# Patient Record
Sex: Male | Born: 1965 | Race: Black or African American | Hispanic: No | Smoking: Current every day smoker
Health system: Southern US, Community
[De-identification: ages and names within clinical notes are randomized; demographics above are authoritative.]

## PROBLEM LIST (undated history)

## (undated) DIAGNOSIS — M1990 Unspecified osteoarthritis, unspecified site: Secondary | ICD-10-CM

## (undated) DIAGNOSIS — I10 Essential (primary) hypertension: Secondary | ICD-10-CM

---

## 2017-07-26 ENCOUNTER — Encounter (HOSPITAL_COMMUNITY): Payer: Self-pay | Admitting: Emergency Medicine

## 2017-07-26 ENCOUNTER — Emergency Department (HOSPITAL_COMMUNITY)
Admission: EM | Admit: 2017-07-26 | Discharge: 2017-07-27 | Disposition: A | Discharge status: Home / Self Care | Payer: BLUE CROSS/BLUE SHIELD | Attending: Emergency Medicine | Admitting: Emergency Medicine

## 2017-07-26 ENCOUNTER — Emergency Department (HOSPITAL_COMMUNITY): Payer: BLUE CROSS/BLUE SHIELD

## 2017-07-26 DIAGNOSIS — R42 Dizziness and giddiness: Secondary | ICD-10-CM | POA: Diagnosis not present

## 2017-07-26 DIAGNOSIS — I10 Essential (primary) hypertension: Secondary | ICD-10-CM | POA: Insufficient documentation

## 2017-07-26 DIAGNOSIS — Z7982 Long term (current) use of aspirin: Secondary | ICD-10-CM | POA: Diagnosis not present

## 2017-07-26 DIAGNOSIS — R51 Headache: Secondary | ICD-10-CM | POA: Diagnosis not present

## 2017-07-26 HISTORY — DX: Essential (primary) hypertension: I10

## 2017-07-26 LAB — I-STAT CHEM 8, ED
BUN: 25 mg/dL — AB (ref 6–20)
CALCIUM ION: 1.21 mmol/L (ref 1.15–1.40)
CHLORIDE: 104 mmol/L (ref 101–111)
Creatinine, Ser: 1.4 mg/dL — ABNORMAL HIGH (ref 0.61–1.24)
GLUCOSE: 97 mg/dL (ref 65–99)
HCT: 44 % (ref 39.0–52.0)
Hemoglobin: 15 g/dL (ref 13.0–17.0)
Potassium: 3.6 mmol/L (ref 3.5–5.1)
Sodium: 139 mmol/L (ref 135–145)
TCO2: 24 mmol/L (ref 22–32)

## 2017-07-26 MED ORDER — HYDROXYZINE HCL 25 MG PO TABS: 25.0000 mg | ORAL_TABLET | Freq: Four times a day (QID) | ORAL | 0 refills | Status: DC | PRN

## 2017-07-26 MED ORDER — LISINOPRIL 20 MG PO TABS
20.0000 mg | ORAL_TABLET | Freq: Once | ORAL | Status: AC
  Administered 2017-07-26: 20 mg via ORAL
  Filled 2017-07-26: qty 1

## 2017-07-26 MED ORDER — OXYCODONE-ACETAMINOPHEN 5-325 MG PO TABS
1.0000 | ORAL_TABLET | Freq: Once | ORAL | Status: AC
  Administered 2017-07-26: 1 via ORAL
  Filled 2017-07-26: qty 1

## 2017-07-26 MED ORDER — LORAZEPAM 2 MG/ML IJ SOLN
1.0000 mg | Freq: Once | INTRAMUSCULAR | Status: AC
  Administered 2017-07-26: 1 mg via INTRAMUSCULAR
  Filled 2017-07-26: qty 1

## 2017-07-26 MED ORDER — LISINOPRIL 20 MG PO TABS: 20.0000 mg | ORAL_TABLET | Freq: Every day | ORAL | 1 refills | Status: DC

## 2017-07-26 MED ORDER — TRAMADOL HCL 50 MG PO TABS: 50.0000 mg | ORAL_TABLET | Freq: Four times a day (QID) | ORAL | 0 refills | Status: DC | PRN

## 2017-07-26 NOTE — Discharge Instructions (Signed)
Follow-up with cardiologist in 1-2 weeks.  Also follow-up with her primary care doctor

## 2017-07-26 NOTE — ED Triage Notes (Signed)
Patient c/o headache and dizziness x1 week, stating "I know my blood pressure is up, I haven't had my medicine in about six months." Denies chest pain.

## 2017-07-27 NOTE — ED Provider Notes (Signed)
[facility]   CSN: [id number] Arrival date & time: 07/26/17  1723     History   Chief Complaint Chief Complaint  Patient presents with  . Hypertension    HPI Justin Moore is a 52 y.o. male.  Patient states he has been off his blood pressure medicine for a while.  Patient complains of some dizziness.   The history is provided by the patient.  Hypertension  This is a recurrent problem. The current episode started more than 1 week ago. The problem occurs constantly. The problem has not changed since onset.Pertinent negatives include no chest pain, no abdominal pain and no headaches. Nothing aggravates the symptoms. Nothing relieves the symptoms.    Past Medical History:  Diagnosis Date  . Hypertension     There are no active problems to display for this patient.   History reviewed. No pertinent surgical history.     Home Medications    Prior to Admission medications   Medication Sig Start Date End Date Taking? Authorizing Provider  Aspirin-Salicylamide-Caffeine (BC HEADACHE POWDER PO) Take 3 packets by mouth daily as needed (pain).   Yes [provider]  hydrOXYzine (ATARAX/VISTARIL) 25 MG tablet Take 1 tablet (25 mg total) by mouth every 6 (six) hours as needed for anxiety (sleep). 07/26/17   Bethann BerkshireZammit, Gilbert Narain, MD  lisinopril (PRINIVIL,ZESTRIL) 20 MG tablet Take 1 tablet (20 mg total) by mouth daily. 07/26/17   Bethann BerkshireZammit, Collen Hostler, MD  traMADol (ULTRAM) 50 MG tablet Take 1 tablet (50 mg total) by mouth every 6 (six) hours as needed. 07/26/17   Bethann BerkshireZammit, Cyleigh Massaro, MD    Family History No family history on file.  Social History Social History   Tobacco Use  . Smoking status: Not on file  Substance Use Topics  . Alcohol use: Not on file  . Drug use: Not on file     Allergies   Patient has no known allergies.   Review of Systems Review of Systems  Constitutional: Negative for appetite change and fatigue.  HENT:  Negative for congestion, ear discharge and sinus pressure.   Eyes: Negative for discharge.  Respiratory: Negative for cough.   Cardiovascular: Negative for chest pain.  Gastrointestinal: Negative for abdominal pain and diarrhea.  Genitourinary: Negative for frequency and hematuria.  Musculoskeletal: Negative for back pain.  Skin: Negative for rash.  Neurological: Positive for dizziness. Negative for seizures and headaches.  Psychiatric/Behavioral: Negative for hallucinations.     Physical Exam Updated Vital Signs BP (!) 192/112 (BP Location: Left Arm)   Pulse 68   Temp 98.2 F (36.8 C) (Oral)   Resp 18   SpO2 94%   Physical Exam  Constitutional: He is oriented to person, place, and time. He appears well-developed.  HENT:  Head: Normocephalic.  Eyes: Conjunctivae and EOM are normal. No scleral icterus.  Neck: Neck supple. No thyromegaly present.  Cardiovascular: Normal rate and regular rhythm. Exam reveals no gallop and no friction rub.  No murmur heard. Pulmonary/Chest: No stridor. He has no wheezes. He has no rales. He exhibits no tenderness.  Abdominal: He exhibits no distension. There is no tenderness. There is no rebound.  Musculoskeletal: Normal range of motion. He exhibits no edema.  Lymphadenopathy:    He has no cervical adenopathy.  Neurological: He is oriented to person, place, and time. He exhibits normal muscle tone. Coordination normal.  Skin: No rash noted. No erythema.  Psychiatric: He has a normal mood and affect. His behavior is  normal.     ED Treatments / Results  Labs (all labs ordered are listed, but only abnormal results are displayed) Labs Reviewed  I-STAT CHEM 8, ED - Abnormal; Notable for the following components:      Result Value   BUN 25 (*)    Creatinine, Ser 1.40 (*)    All other components within normal limits    EKG  EKG Interpretation None       Radiology Dg Chest 2 View  Result Date: 07/26/2017 CLINICAL DATA:  Headache and  dizziness x1 week. EXAM: CHEST  2 VIEW COMPARISON:  None. FINDINGS: The heart size and mediastinal contours are within normal limits. Both lungs are clear. The visualized skeletal structures are unremarkable. IMPRESSION: No active cardiopulmonary disease. Electronically Signed   By: Tollie Eth M.D.   On: 07/26/2017 23:06    Procedures Procedures (including critical care time)  Medications Ordered in ED Medications  oxyCODONE-acetaminophen (PERCOCET/ROXICET) 5-325 MG per tablet 1 tablet (1 tablet Oral Given 07/26/17 2246)  lisinopril (PRINIVIL,ZESTRIL) tablet 20 mg (20 mg Oral Given 07/26/17 2246)  LORazepam (ATIVAN) injection 1 mg (1 mg Intramuscular Given 07/26/17 2327)     Initial Impression / Assessment and Plan / ED Course  I have reviewed the triage vital signs and the nursing notes.  Pertinent labs & imaging results that were available during my care of the patient were reviewed by me and considered in my medical decision making (see chart for details).   Patient blood pressure improved with treatment.  He will be sent home with lisinopril and Vistaril to help with sleep and anxiety  Final Clinical Impressions(s) / ED Diagnoses   Final diagnoses:  Essential hypertension    ED Discharge Orders        Ordered    lisinopril (PRINIVIL,ZESTRIL) 20 MG tablet  Daily     07/26/17 2349    hydrOXYzine (ATARAX/VISTARIL) 25 MG tablet  Every 6 hours PRN     07/26/17 2349    traMADol (ULTRAM) 50 MG tablet  Every 6 hours PRN     07/26/17 2349       Bethann Berkshire, MD 07/27/17 2336

## 2017-07-28 ENCOUNTER — Telehealth: Payer: Self-pay | Admitting: Emergency Medicine

## 2017-07-28 NOTE — Telephone Encounter (Signed)
[place] call [facility] [place] [facility] stating Dr. Estell HarpinZammit had not signed the prescription for tramadol.  Confirmed prescription information from chart review with Morrie Sheldonshley who confirmed.  Prescription will now be filled.  No further CM needs noted at this time.

## 2017-08-25 ENCOUNTER — Ambulatory Visit (INDEPENDENT_AMBULATORY_CARE_PROVIDER_SITE_OTHER): Payer: BLUE CROSS/BLUE SHIELD | Admitting: Family Medicine

## 2017-08-25 ENCOUNTER — Encounter: Payer: Self-pay | Admitting: Family Medicine

## 2017-08-25 VITALS — BP 154/92 | HR 80 | Temp 98.8°F | Resp 16 | Ht 70.0 in | Wt 225.0 lb

## 2017-08-25 DIAGNOSIS — Z136 Encounter for screening for cardiovascular disorders: Secondary | ICD-10-CM | POA: Diagnosis not present

## 2017-08-25 DIAGNOSIS — R51 Headache: Secondary | ICD-10-CM

## 2017-08-25 DIAGNOSIS — H547 Unspecified visual loss: Secondary | ICD-10-CM | POA: Diagnosis not present

## 2017-08-25 DIAGNOSIS — R809 Proteinuria, unspecified: Secondary | ICD-10-CM | POA: Diagnosis not present

## 2017-08-25 DIAGNOSIS — Z131 Encounter for screening for diabetes mellitus: Secondary | ICD-10-CM

## 2017-08-25 DIAGNOSIS — I10 Essential (primary) hypertension: Secondary | ICD-10-CM | POA: Diagnosis not present

## 2017-08-25 DIAGNOSIS — R9431 Abnormal electrocardiogram [ECG] [EKG]: Secondary | ICD-10-CM

## 2017-08-25 DIAGNOSIS — R519 Headache, unspecified: Secondary | ICD-10-CM

## 2017-08-25 LAB — POCT GLYCOSYLATED HEMOGLOBIN (HGB A1C): HEMOGLOBIN A1C: 5.6

## 2017-08-25 LAB — POCT URINALYSIS DIP (DEVICE)
Glucose, UA: NEGATIVE mg/dL
Hgb urine dipstick: NEGATIVE
Leukocytes, UA: NEGATIVE
Nitrite: NEGATIVE
PROTEIN: 100 mg/dL — AB
Specific Gravity, Urine: 1.025 (ref 1.005–1.030)
Urobilinogen, UA: 2 mg/dL — ABNORMAL HIGH (ref 0.0–1.0)
pH: 6 (ref 5.0–8.0)

## 2017-08-25 MED ORDER — AMLODIPINE BESYLATE 10 MG PO TABS: 10.0000 mg | ORAL_TABLET | Freq: Every day | ORAL | 3 refills | Status: DC

## 2017-08-25 MED ORDER — BUTALBITAL-APAP-CAFF-COD 50-300-40-30 MG PO CAPS: 1.0000 | ORAL_CAPSULE | Freq: Four times a day (QID) | ORAL | 0 refills | Status: DC | PRN

## 2017-08-25 MED ORDER — LABETALOL HCL 100 MG PO TABS: 100.0000 mg | ORAL_TABLET | Freq: Two times a day (BID) | ORAL | 1 refills | Status: DC

## 2017-08-25 NOTE — Patient Instructions (Signed)
I have placed your referral for cardiology and ophthalmology.  He will be contacted to schedule an appointment.  I am rechecking your renal function today as it was elevated during her recent ED visit.  I encourage you to reduce alcohol intake as this can adversely affect your cardiovascular health and liver function.  For your headache and start you on Fioricet you may take 1-2 tablets every 6 hours as needed for headache.  For now I would like you to stop the lisinopril.  I am starting you on amlodipine 10 mg once daily and labetalol 100 mg twice daily for blood pressure control.  Goal blood pressure is less than 140/90.  If your blood pressures persistently greater than 160/90 please follow-up with me here in the office sooner than your next scheduled appointment.   Hypertension Hypertension is another name for high blood pressure. High blood pressure forces your heart to work harder to pump blood. This can cause problems over time. There are two numbers in a blood pressure reading. There is a top number (systolic) over a bottom number (diastolic). It is best to have a blood pressure below 120/80. Healthy choices can help lower your blood pressure. You may need medicine to help lower your blood pressure if:  Your blood pressure cannot be lowered with healthy choices.  Your blood pressure is higher than 130/80.  Follow these instructions at home: Eating and drinking  If directed, follow the DASH eating plan. This diet includes: ? Filling half of your plate at each meal with fruits and vegetables. ? Filling one quarter of your plate at each meal with whole grains. Whole grains include whole wheat pasta, brown rice, and whole grain bread. ? Eating or drinking low-fat dairy products, such as skim milk or low-fat yogurt. ? Filling one quarter of your plate at each meal with low-fat (lean) proteins. Low-fat proteins include fish, skinless chicken, eggs, beans, and tofu. ? Avoiding fatty  meat, cured and processed meat, or chicken with skin. ? Avoiding premade or processed food.  Eat less than 1,500 mg of salt (sodium) a day.  Limit alcohol use to no more than 1 drink a day for nonpregnant women and 2 drinks a day for men. One drink equals 12 oz of beer, 5 oz of wine, or 1 oz of hard liquor. Lifestyle  Work with your doctor to stay at a healthy weight or to lose weight. Ask your doctor what the best weight is for you.  Get at least 30 minutes of exercise that causes your heart to beat faster (aerobic exercise) most days of the week. This may include walking, swimming, or biking.  Get at least 30 minutes of exercise that strengthens your muscles (resistance exercise) at least 3 days a week. This may include lifting weights or pilates.  Do not use any products that contain nicotine or tobacco. This includes cigarettes and e-cigarettes. If you need help quitting, ask your doctor.  Check your blood pressure at home as told by your doctor.  Keep all follow-up visits as told by your doctor. This is important. Medicines  Take over-the-counter and prescription medicines only as told by your doctor. Follow directions carefully.  Do not skip doses of blood pressure medicine. The medicine does not work as well if you skip doses. Skipping doses also puts you at risk for problems.  Ask your doctor about side effects or reactions to medicines that you should watch for. Contact a doctor if:  You think you are  having a reaction to the medicine you are taking.  You have headaches that keep coming back (recurring).  You feel dizzy.  You have swelling in your ankles.  You have trouble with your vision. Get help right away if:  You get a very bad headache.  You start to feel confused.  You feel weak or numb.  You feel faint.  You get very bad pain in your: ? Chest. ? Belly (abdomen).  You throw up (vomit) more than once.  You have trouble  breathing. Summary  Hypertension is another name for high blood pressure.  Making healthy choices can help lower blood pressure. If your blood pressure cannot be controlled with healthy choices, you may need to take medicine. This information is not intended to replace advice given to you by your health care provider. Make sure you discuss any questions you have with your health care provider. Document Released: 12/28/2007 Document Revised: 06/08/2016 Document Reviewed: 06/08/2016 Elsevier Interactive Patient Education  2018 [employer]     Alcohol Use Disorder Alcohol use disorder is when your drinking disrupts your daily life. When you have this condition, you drink too much alcohol and you cannot control your drinking. Alcohol use disorder can cause serious problems with your physical health. It can affect your brain, heart, liver, pancreas, immune system, stomach, and intestines. Alcohol use disorder can increase your risk for certain cancers and cause problems with your mental health, such as depression, anxiety, psychosis, delirium, and dementia. People with this disorder risk hurting themselves and others. What are the causes? This condition is caused by drinking too much alcohol over time. It is not caused by drinking too much alcohol only one or two times. Some people with this condition drink alcohol to cope with or escape from negative life events. Others drink to relieve pain or symptoms of mental illness. What increases the risk? You are more likely to develop this condition if:  You have a family history of alcohol use disorder.  Your culture encourages drinking to the point of intoxication, or makes alcohol easy to get.  You had a mood or conduct disorder in childhood.  You have been a victim of abuse.  You are an adolescent and: ? You have poor grades or difficulties in school. ? Your caregivers do not talk to you about saying no to alcohol, or supervise your  activities. ? You are impulsive or you have trouble with self-control.  What are the signs or symptoms? Symptoms of this condition include:  Drinkingmore than you want to.  Drinking for longer than you want to.  Trying several times to drink less or to control your drinking.  Spending a lot of time getting alcohol, drinking, or recovering from drinking.  Craving alcohol.  Having problems at work, at school, or at home due to drinking.  Having problems in relationships due to drinking.  Drinking when it is dangerous to drink, such as before driving a car.  Continuing to drink even though you know you might have a physical or mental problem related to drinking.  Needing more and more alcohol to get the same effect you want from the alcohol (building up tolerance).  Having symptoms of withdrawal when you stop drinking. Symptoms of withdrawal include: ? Fatigue. ? Nightmares. ? Trouble sleeping. ? Depression. ? Anxiety. ? Fever. ? Seizures. ? Severe confusion. ? Feeling or seeing things that are not there (hallucinations). ? Tremors. ? Rapid heart rate. ? Rapid breathing. ? High blood pressure.  Drinking to avoid symptoms of withdrawal.  How is this diagnosed? This condition is diagnosed with an assessment. Your health care provider may start the assessment by asking three or four questions about your drinking. Your health care provider may perform a physical exam or do lab tests to see if you have physical problems resulting from alcohol use. She or he may refer you to a mental health professional for evaluation. How is this treated? Some people with alcohol use disorder are able to reduce their alcohol use to low-risk levels. Others need to completely quit drinking alcohol. When necessary, mental health professionals with specialized training in substance use treatment can help. Your health care provider can help you decide how severe your alcohol use disorder is and  what type of treatment you need. The following forms of treatment are available:  Detoxification. Detoxification involves quitting drinking and using prescription medicines within the first week to help lessen withdrawal symptoms. This treatment is important for people who have had withdrawal symptoms before and for heavy drinkers who are likely to have withdrawal symptoms. Alcohol withdrawal can be dangerous, and in severe cases, it can cause death. Detoxification may be provided in a home, community, or primary care setting, or in a hospital or substance use treatment facility.  Counseling. This treatment is also called talk therapy. It is provided by substance use treatment counselors. A counselor can address the reasons you use alcohol and suggest ways to keep you from drinking again or to prevent problem drinking. The goals of talk therapy are to: ? Find healthy activities and ways for you to cope with stress. ? Identify and avoid the things that trigger your alcohol use. ? Help you learn how to handle cravings.  Medicines.Medicines can help treat alcohol use disorder by: ? Decreasing alcohol cravings. ? Decreasing the positive feeling you have when you drink alcohol. ? Causing an uncomfortable physical reaction when you drink alcohol (aversion therapy).  Support groups. Support groups are led by people who have quit drinking. They provide emotional support, advice, and guidance.  These forms of treatment are often combined. Some people with this condition benefit from a combination of treatments provided by specialized substance use treatment centers. Follow these instructions at home:  Take over-the-counter and prescription medicines only as told by your health care provider.  Check with your health care provider before starting any new medicines.  Ask friends and family members not to offer you alcohol.  Avoid situations where alcohol is served, including gatherings where others are  drinking alcohol.  Create a plan for what to do when you are tempted to use alcohol.  Find hobbies or activities that you enjoy that do not include alcohol.  Keep all follow-up visits as told by your health care provider. This is important. How is this prevented?  If you drink, limit alcohol intake to no more than 1 drink a day for nonpregnant women and 2 drinks a day for men. One drink equals 12 oz of beer, 5 oz of wine, or 1 oz of hard liquor.  If you have a mental health condition, get treatment and support.  Do not give alcohol to adolescents.  If you are an adolescent: ? Do not drink alcohol. ? Do not be afraid to say no if someone offers you alcohol. Speak up about why you do not want to drink. You can be a positive role model for your friends and set a good example for those around you by not drinking  alcohol. ? If your friends drink, spend time with others who do not drink alcohol. Make new friends who do not use alcohol. ? Find healthy ways to manage stress and emotions, such as meditation or deep breathing, exercise, spending time in nature, listening to music, or talking with a trusted friend or family member. Contact a health care provider if:  You are not able to take your medicines as told.  Your symptoms get worse.  You return to drinking alcohol (relapse) and your symptoms get worse. Get help right away if:  You have thoughts about hurting yourself or others. If you ever feel like you may hurt yourself or others, or have thoughts about taking your own life, get help right away. You can go to your nearest emergency department or call:  Your local emergency services (911 in the [place]  A suicide crisis helpline, such as the National Suicide Prevention Lifeline at 206-626-9355. This is open 24 hours a day.  Summary  Alcohol use disorder is when your drinking disrupts your daily life. When you have this condition, you drink too much alcohol and you cannot control  your drinking.  Treatment may include detoxification, counseling, medicine, and support groups.  Ask friends and family members not to offer you alcohol. Avoid situations where alcohol is served.  Get help right away if you have thoughts about hurting yourself or others. This information is not intended to replace advice given to you by your health care provider. Make sure you discuss any questions you have with your health care provider. Document Released: 08/18/2004 Document Revised: 04/07/2016 Document Reviewed: 04/07/2016 Elsevier Interactive Patient Education  [employer].

## 2017-08-25 NOTE — Progress Notes (Signed)
Patient ID: Justin Moore, male    DOB: 19-Mar-1966, 52 y.o.   MRN: [id number]  PCP: Bing Neighbors, FNP  Chief Complaint  Patient presents with  . Establish Care  . Hospitalization Follow-up    Subjective:  HPI Justin Moore is a 52 y.o. male with hypertension, chronic alcohol use, everyday smoker, presents for hospital follow-up and to establish care. Diagnosed with hypertension 2007 and reports that he has been off blood pressure medication for 2 years. He presented to the ED 07/26/2017 with a complaint of dizziness, headache, and feeling poorly which he attributed to his blood pressure. While at ED he was started back on BP medications.  Occasionally monitors blood pressure at home with most readings greater than 200/100. Reports that his vision has worsened over the course of year.  Current daily smoker and reports drinking on average 1 pint of liquor per day, he does not see this as a problem.  He reports chronic shortness of breath which he attributes to smoking.  Family history is significant for heart disease his father died at age 54 of an MI and hypertension.  His mother is living and has hypertension. Denies chest pain, cough, leg swelling, headache, or new weakness. Social History   Socioeconomic History  . Marital status: Unknown    Spouse name: Not on file  . Number of children: Not on file  . Years of education: Not on file  . Highest education level: Not on file  Social Needs  . Financial resource strain: Not on file  . Food insecurity - worry: Not on file  . Food insecurity - inability: Not on file  . Transportation needs - medical: Not on file  . Transportation needs - non-medical: Not on file  Occupational History  . Not on file  Tobacco Use  . Smoking status: Current Every Day Smoker  . Smokeless tobacco: Never Used  Substance and Sexual Activity  . Alcohol use: Yes  . Drug use: No  . Sexual activity: Not on file  Other Topics Concern  . Not on file   Social History Narrative  . Not on file   Family History  Problem Relation Age of Onset  . Hypertension Mother   . Heart attack Father   . Hypertension Father     Review of Systems  Constitutional: Negative.   Eyes: Positive for visual disturbance.  Respiratory: Positive for shortness of breath. Negative for cough.   Cardiovascular: Negative.   Gastrointestinal: Negative.   Genitourinary: Negative.   Musculoskeletal: Negative.   Neurological: Positive for headaches.  Hematological: Negative.   Psychiatric/Behavioral: Negative.    No Known Allergies  Prior to Admission medications   Medication Sig Start Date End Date Taking? Authorizing Provider  Aspirin-Salicylamide-Caffeine (BC HEADACHE POWDER PO) Take 3 packets by mouth daily as needed (pain).   Yes [provider]  lisinopril (PRINIVIL,ZESTRIL) 20 MG tablet Take 1 tablet (20 mg total) by mouth daily. 07/26/17  Yes Bethann Berkshire, MD  hydrOXYzine (ATARAX/VISTARIL) 25 MG tablet Take 1 tablet (25 mg total) by mouth every 6 (six) hours as needed for anxiety (sleep). Patient not taking: Reported on 08/25/2017 07/26/17   Bethann Berkshire, MD  traMADol (ULTRAM) 50 MG tablet Take 1 tablet (50 mg total) by mouth every 6 (six) hours as needed. Patient not taking: Reported on 08/25/2017 07/26/17   Bethann Berkshire, MD    Past Medical, Surgical Family and Social History reviewed and updated.    Objective:  Today's Vitals   08/25/17 1001 08/25/17 1250  BP: (!) 160/94 (!) 154/92  Pulse: 80   Resp: 16   Temp: 98.8 F (37.1 C)   TempSrc: Oral   SpO2: 100%   Weight: 225 lb (102.1 kg)   Height: 5\' 10"  (1.778 m)     Wt Readings from Last 3 Encounters:  08/25/17 225 lb (102.1 kg)   Physical Exam  Constitutional: He is oriented to person, place, and time. He appears well-developed.  HENT:  Head: Normocephalic and atraumatic.  Eyes: Conjunctivae and EOM are normal. Pupils are equal, round, and reactive to light.  Neck: Normal  range of motion. Neck supple.  Cardiovascular: Normal rate, regular rhythm, normal heart sounds and intact distal pulses.  Pulmonary/Chest: Effort normal and breath sounds normal.  Abdominal: Soft. Bowel sounds are normal.  Musculoskeletal: Normal range of motion.  Neurological: He is alert and oriented to person, place, and time.  Skin: Skin is warm and dry.  Psychiatric: He has a normal mood and affect. His behavior is normal. Judgment and thought content normal.   Assessment & Plan:  1. Essential hypertension, Elevated, not at goal. Continue  lisinopril.  Adding labetalol 100 mg twice daily. We have discussed target BP range and blood pressure goal. I have advised patient to check BP regularly and to call us back or report to clinic if the numbers are consistently higher than 140/90. We discussed the importance of compliance with medical therapy and DASH diet recommended, consequences of uncontrolled hypertension discussed. Checking TSH, level today.   2. Abnormal EKG, EKG taken in the ED was significant for ST elevation and T wave abnormality.  Due to prolonged uncontrolled hypertension and shortness of breath symptoms I will refer to cardiology for further evaluation. Ambulatory referral to cardiology.  3. Screening for diabetes mellitus, A1C 5.6. Normal, will repeat in 12 months.  4. Proteinuria, unspecified type, secondary to uncontrolled accelerated hypertension. Will obtain microalbumin/creatinine ratio, urine, comprehensive metabolic panel  5. Screening for cardiovascular condition- Lipid panel  6. Nonintractable headache, unspecified chronicity pattern, unspecified headache type, will trial Fioricet 1-2 tablets every 6 hours as needed for headache pain.  7. Visual acuity reduced, referring ambulatory referral to Ophthalmology.    Meds ordered this encounter  Medications  . labetalol (NORMODYNE) 100 MG tablet    Sig: Take 1 tablet (100 mg total) by mouth 2 (two) times daily.     Dispense:  180 tablet    Refill:  1    Order Specific Question:   Supervising Provider    Answer:   Quentin AngstJEGEDE, OLUGBEMIGA E [id number]]  . amLODipine (NORVASC) 10 MG tablet    Sig: Take 1 tablet (10 mg total) by mouth daily.    Dispense:  90 tablet    Refill:  3    Order Specific Question:   Supervising Provider    Answer:   Quentin AngstJEGEDE, OLUGBEMIGA E [id number]]  . Butalbital-APAP-Caff-Cod 50-300-40-30 MG CAPS    Sig: Take 1-2 tablets by mouth every 6 (six) hours as needed.    Dispense:  60 capsule    Refill:  0    Order Specific Question:   Supervising Provider    Answer:   Quentin AngstJEGEDE, OLUGBEMIGA E [[id number]]    Orders Placed This Encounter  Procedures  . Microalbumin/Creatinine Ratio, Urine  . Comprehensive metabolic panel  . CBC with Differential  . Thyroid Panel With TSH  . Lipid panel  . Ambulatory referral to Cardiology  . Ambulatory referral to Ophthalmology  .  POCT glycosylated hemoglobin (Hb A1C)  . POCT urinalysis dip (device)    RTC: 3 months for chronic condition follow-up and 2 weeks for hypertension follow-up.  Godfrey Pick. Tiburcio Pea, MSN, FNP-C [facility]  [address] [place], [place] [address] [phone]

## 2017-08-26 LAB — COMPREHENSIVE METABOLIC PANEL
ALT: 24 IU/L (ref 0–44)
AST: 26 IU/L (ref 0–40)
Albumin/Globulin Ratio: 1.7 (ref 1.2–2.2)
Albumin: 4.6 g/dL (ref 3.5–5.5)
Alkaline Phosphatase: 65 IU/L (ref 39–117)
BUN/Creatinine Ratio: 10 (ref 9–20)
BUN: 13 mg/dL (ref 6–24)
Bilirubin Total: 0.5 mg/dL (ref 0.0–1.2)
CALCIUM: 9.3 mg/dL (ref 8.7–10.2)
CO2: 19 mmol/L — AB (ref 20–29)
CREATININE: 1.29 mg/dL — AB (ref 0.76–1.27)
Chloride: 104 mmol/L (ref 96–106)
GFR calc Af Amer: 75 mL/min/{1.73_m2} (ref >59)
GFR, EST NON AFRICAN AMERICAN: 65 mL/min/{1.73_m2} (ref >59)
GLOBULIN, TOTAL: 2.7 g/dL (ref 1.5–4.5)
Glucose: 98 mg/dL (ref 65–99)
Potassium: 3.8 mmol/L (ref 3.5–5.2)
Sodium: 145 mmol/L — ABNORMAL HIGH (ref 134–144)
Total Protein: 7.3 g/dL (ref 6.0–8.5)

## 2017-08-26 LAB — CBC WITH DIFFERENTIAL/PLATELET
BASOS: 0 %
Basophils Absolute: 0 10*3/uL (ref 0.0–0.2)
EOS (ABSOLUTE): 0.1 10*3/uL (ref 0.0–0.4)
EOS: 1 %
HEMATOCRIT: 42.7 % (ref 37.5–51.0)
Hemoglobin: 15.5 g/dL (ref 13.0–17.7)
IMMATURE GRANS (ABS): 0 10*3/uL (ref 0.0–0.1)
IMMATURE GRANULOCYTES: 0 %
LYMPHS: 29 %
Lymphocytes Absolute: 2.5 10*3/uL (ref 0.7–3.1)
MCH: 32.8 pg (ref 26.6–33.0)
MCHC: 36.3 g/dL — ABNORMAL HIGH (ref 31.5–35.7)
MCV: 91 fL (ref 79–97)
Monocytes Absolute: 0.6 10*3/uL (ref 0.1–0.9)
Monocytes: 7 %
NEUTROS PCT: 63 %
Neutrophils Absolute: 5.3 10*3/uL (ref 1.4–7.0)
Platelets: 229 10*3/uL (ref 150–379)
RBC: 4.72 x10E6/uL (ref 4.14–5.80)
RDW: 13.9 % (ref 12.3–15.4)
WBC: 8.6 10*3/uL (ref 3.4–10.8)

## 2017-08-26 LAB — MICROALBUMIN / CREATININE URINE RATIO
Creatinine, Urine: 363 mg/dL
MICROALB/CREAT RATIO: 100.1 mg/g{creat} — AB (ref 0.0–30.0)
MICROALBUM., U, RANDOM: 363.2 ug/mL

## 2017-08-26 LAB — THYROID PANEL WITH TSH
FREE THYROXINE INDEX: 1.5 (ref 1.2–4.9)
T3 UPTAKE RATIO: 29 % (ref 24–39)
T4, Total: 5.2 ug/dL (ref 4.5–12.0)
TSH: 1.47 u[IU]/mL (ref 0.450–4.500)

## 2017-08-26 LAB — LIPID PANEL
Chol/HDL Ratio: 4.9 ratio (ref 0.0–5.0)
Cholesterol, Total: 215 mg/dL — ABNORMAL HIGH (ref 100–199)
HDL: 44 mg/dL (ref >39)
LDL Calculated: 139 mg/dL — ABNORMAL HIGH (ref 0–99)
TRIGLYCERIDES: 159 mg/dL — AB (ref 0–149)
VLDL Cholesterol Cal: 32 mg/dL (ref 5–40)

## 2017-08-31 ENCOUNTER — Encounter: Payer: Self-pay | Admitting: Family Medicine

## 2017-08-31 ENCOUNTER — Telehealth: Payer: Self-pay | Admitting: Family Medicine

## 2017-08-31 MED ORDER — ATORVASTATIN CALCIUM 40 MG PO TABS: 40.0000 mg | ORAL_TABLET | Freq: Every day | ORAL | 3 refills | Status: DC

## 2017-08-31 NOTE — Telephone Encounter (Signed)
Office visit note complete please complete referral to ophthalmology.   Patient to advise his renal function is improving therefore he should continue medications to improve overall blood pressure as this will improve protein spilling in urine. His cholesterol panel is abnormal. I am starting him on atorvastatin 40 mg once daily.   Godfrey PickKimberly S. Tiburcio PeaHarris, MSN, FNP-C [facility]  [address] [place], [place] [address] [phone]

## 2017-09-01 NOTE — Telephone Encounter (Signed)
Left a vm for patient to callback 

## 2017-09-04 NOTE — Telephone Encounter (Signed)
Left a vm for patient to callback 

## 2017-09-05 ENCOUNTER — Ambulatory Visit: Payer: BLUE CROSS/BLUE SHIELD | Admitting: Physician Assistant

## 2017-09-05 NOTE — Telephone Encounter (Signed)
Left a vm for patient to callback 

## 2017-09-11 NOTE — Telephone Encounter (Signed)
Letter mailed for patient to contact office.

## 2017-09-13 ENCOUNTER — Encounter: Payer: Self-pay | Admitting: Physician Assistant

## 2017-11-22 ENCOUNTER — Ambulatory Visit: Payer: BLUE CROSS/BLUE SHIELD | Admitting: Family Medicine

## 2018-07-08 ENCOUNTER — Encounter: Payer: Self-pay | Admitting: Emergency Medicine

## 2018-07-08 ENCOUNTER — Emergency Department
Admission: EM | Admit: 2018-07-08 | Discharge: 2018-07-08 | Disposition: A | Discharge status: Home / Self Care | Payer: Self-pay | Attending: Emergency Medicine | Admitting: Emergency Medicine

## 2018-07-08 DIAGNOSIS — F17200 Nicotine dependence, unspecified, uncomplicated: Secondary | ICD-10-CM | POA: Insufficient documentation

## 2018-07-08 DIAGNOSIS — I10 Essential (primary) hypertension: Secondary | ICD-10-CM | POA: Insufficient documentation

## 2018-07-08 LAB — BASIC METABOLIC PANEL
ANION GAP: 10 (ref 5–15)
BUN: 22 mg/dL — ABNORMAL HIGH (ref 6–20)
CO2: 22 mmol/L (ref 22–32)
Calcium: 8.6 mg/dL — ABNORMAL LOW (ref 8.9–10.3)
Chloride: 110 mmol/L (ref 98–111)
Creatinine, Ser: 1.23 mg/dL (ref 0.61–1.24)
GFR calc Af Amer: >60 mL/min (ref >60)
Glucose, Bld: 101 mg/dL — ABNORMAL HIGH (ref 70–99)
Potassium: 3.5 mmol/L (ref 3.5–5.1)
SODIUM: 142 mmol/L (ref 135–145)

## 2018-07-08 LAB — CBC
HCT: 42.8 % (ref 39.0–52.0)
Hemoglobin: 15.7 g/dL (ref 13.0–17.0)
MCH: 33.2 pg (ref 26.0–34.0)
MCHC: 36.7 g/dL — ABNORMAL HIGH (ref 30.0–36.0)
MCV: 90.5 fL (ref 80.0–100.0)
Platelets: 248 10*3/uL (ref 150–400)
RBC: 4.73 MIL/uL (ref 4.22–5.81)
RDW: 12.4 % (ref 11.5–15.5)
WBC: 9.2 10*3/uL (ref 4.0–10.5)
nRBC: 0 % (ref 0.0–0.2)

## 2018-07-08 MED ORDER — AMLODIPINE BESYLATE 10 MG PO TABS: 10.0000 mg | ORAL_TABLET | Freq: Every day | ORAL | 11 refills | Status: DC

## 2018-07-08 NOTE — ED Notes (Signed)
Pt asking for work note stating that he was out of work, Wednesday, Thursday, and Friday due to his complaint today. RN explained to pt that we cannot give him a note stating this as we did not see him for the complaint until today.

## 2018-07-08 NOTE — ED Triage Notes (Signed)
Pt to ED with c/o of "not feeling well". Pt states has HTN and has not been taking medication. Pt's BP in triage 172/104. Pt also has c/o of bilateral wrist pain.

## 2018-07-08 NOTE — ED Provider Notes (Signed)
[facility] Emergency Department Provider Note   ____________________________________________    I have reviewed the triage vital signs and the nursing notes.   HISTORY  Chief Complaint Hypertension     HPI Justin Moore is a 52 y.o. male with a history of high blood pressure presents today with complaints of high blood pressure.  Patient reports that he has not taken his blood pressure medication in 5 months, he does not remember what he was supposed to be taking.  He denies chest pain, he denies shortness of breath to me, despite triage notes.  No fevers or chills.  No leg swelling.  He states that he feels "low energy "but otherwise well.  No nausea or vomiting   Past Medical History:  Diagnosis Date  . Hypertension     There are no active problems to display for this patient.   History reviewed. No pertinent surgical history.  Prior to Admission medications   Medication Sig Start Date End Date Taking? Authorizing Provider  amLODipine (NORVASC) 10 MG tablet Take 1 tablet (10 mg total) by mouth daily. 07/08/18 07/08/19  Jene Every, MD     Allergies Patient has no known allergies.  History reviewed. No pertinent family history.  Social History Social History   Tobacco Use  . Smoking status: Current Every Day Smoker    Packs/day: 0.50  . Smokeless tobacco: Never Used  Substance Use Topics  . Alcohol use: Yes  . Drug use: Never    Review of Systems  Constitutional: No fever/chills low energy Eyes: No visual changes.  ENT: No sore throat. Cardiovascular: Denies chest pain. Respiratory: Denies shortness of breath. Gastrointestinal: No abdominal pain.   Genitourinary: Negative for dysuria. Musculoskeletal: Negative for back pain. Skin: Negative for rash. Neurological: Negative for headaches or weakness   ____________________________________________   PHYSICAL EXAM:  VITAL SIGNS: ED Triage Vitals  Enc Vitals Group      BP 07/08/18 1458 (!) 172/104     Pulse Rate 07/08/18 1458 77     Resp 07/08/18 1458 18     Temp 07/08/18 1458 98 F (36.7 C)     Temp Source 07/08/18 1458 Oral     SpO2 07/08/18 1458 98 %     Weight 07/08/18 1502 95.3 kg (210 lb)     Height 07/08/18 1502 1.753 m (5\' 9" )     Head Circumference --      Peak Flow --      Pain Score 07/08/18 1502 7     Pain Loc --      Pain Edu? --      Excl. in [place]? --   Constitutional: Alert and oriented. No acute distress. Pleasant and interactive Eyes: Conjunctivae are normal.    Mouth/Throat: Mucous membranes are moist.    Cardiovascular: Normal rate, regular rhythm. Grossly normal heart sounds.  Good peripheral circulation. Respiratory: Normal respiratory effort.  No retractions. Lungs CTAB. Gastrointestinal: Soft and nontender. No distention.  No CVA tenderness.  Musculoskeletal: No lower extremity tenderness nor edema.  Warm and well perfused Neurologic:  Normal speech and language. No gross focal neurologic deficits are appreciated.  Skin:  Skin is warm, dry and intact. No rash noted. Psychiatric: Mood and affect are normal. Speech and behavior are normal.  ____________________________________________   LABS (all labs ordered are listed, but only abnormal results are displayed)  Labs Reviewed  BASIC METABOLIC PANEL - Abnormal; Notable for the following components:      Result  Value   Glucose, Bld 101 (*)    BUN 22 (*)    Calcium 8.6 (*)    All other components within normal limits  CBC - Abnormal; Notable for the following components:   MCHC 36.7 (*)    All other components within normal limits  URINALYSIS, COMPLETE (UACMP) WITH MICROSCOPIC  CBG MONITORING, ED   ____________________________________________  EKG  ED ECG REPORT I, Jene Everyobert Josip Merolla, the attending physician, personally viewed and interpreted this ECG.  Date: 07/08/2018  Rhythm: normal sinus rhythm QRS Axis: normal Intervals: normal ST/T Wave  abnormalities: Nonspecific changes Narrative Interpretation: Patient has no chest pain  ____________________________________________  RADIOLOGY  None ____________________________________________   PROCEDURES  Procedure(s) performed: No  Procedures   Critical Care performed: No ____________________________________________   INITIAL IMPRESSION / ASSESSMENT AND PLAN / ED COURSE  Pertinent labs & imaging results that were available during my care of the patient were reviewed by me and considered in my medical decision making (see chart for details).  Patient well-appearing and in no acute distress.  He is here primarily for blood pressure medication.  We will start him on amlodipine and have him [facility].  Return precautions discussed as needed    ____________________________________________   FINAL CLINICAL IMPRESSION(S) / ED DIAGNOSES  Final diagnoses:  Essential hypertension        Note:  This document was prepared using Dragon voice recognition software and may include unintentional dictation errors.    Jene EveryKinner, Alaina Donati, MD 07/08/18 [phone]

## 2018-07-08 NOTE — ED Notes (Signed)
Pt being evaluated for high blood pressure. Pt states that he has been out of his blood pressure medication for about 5 months. Pt states that he just moved to the area and doesn't have a PCP.

## 2018-07-12 ENCOUNTER — Encounter: Payer: Self-pay | Admitting: Family Medicine

## 2019-01-26 ENCOUNTER — Emergency Department
Admission: EM | Admit: 2019-01-26 | Discharge: 2019-01-26 | Disposition: A | Discharge status: Home / Self Care | Payer: Self-pay | Attending: Emergency Medicine | Admitting: Emergency Medicine

## 2019-01-26 ENCOUNTER — Other Ambulatory Visit: Payer: Self-pay

## 2019-01-26 DIAGNOSIS — K047 Periapical abscess without sinus: Secondary | ICD-10-CM | POA: Insufficient documentation

## 2019-01-26 DIAGNOSIS — Z79899 Other long term (current) drug therapy: Secondary | ICD-10-CM | POA: Insufficient documentation

## 2019-01-26 DIAGNOSIS — Z7982 Long term (current) use of aspirin: Secondary | ICD-10-CM | POA: Insufficient documentation

## 2019-01-26 DIAGNOSIS — F17200 Nicotine dependence, unspecified, uncomplicated: Secondary | ICD-10-CM | POA: Insufficient documentation

## 2019-01-26 DIAGNOSIS — I10 Essential (primary) hypertension: Secondary | ICD-10-CM | POA: Insufficient documentation

## 2019-01-26 MED ORDER — CLINDAMYCIN HCL 300 MG PO CAPS: 300.0000 mg | ORAL_CAPSULE | Freq: Three times a day (TID) | ORAL | 0 refills | Status: AC

## 2019-01-26 MED ORDER — ONDANSETRON 4 MG PO TBDP
4.0000 mg | ORAL_TABLET | Freq: Once | ORAL | Status: AC
  Administered 2019-01-26: 4 mg via ORAL
  Filled 2019-01-26: qty 1

## 2019-01-26 MED ORDER — AMLODIPINE BESYLATE 10 MG PO TABS: 10.0000 mg | ORAL_TABLET | Freq: Every day | ORAL | 11 refills | Status: DC

## 2019-01-26 MED ORDER — AMLODIPINE BESYLATE 5 MG PO TABS
10.0000 mg | ORAL_TABLET | Freq: Once | ORAL | Status: AC
  Administered 2019-01-26: 10 mg via ORAL
  Filled 2019-01-26: qty 2

## 2019-01-26 MED ORDER — OXYCODONE-ACETAMINOPHEN 5-325 MG PO TABS
1.0000 | ORAL_TABLET | Freq: Once | ORAL | Status: AC
  Administered 2019-01-26: 1 via ORAL
  Filled 2019-01-26: qty 1

## 2019-01-26 MED ORDER — HYDROCODONE-ACETAMINOPHEN 5-325 MG PO TABS: 1.0000 | ORAL_TABLET | Freq: Four times a day (QID) | ORAL | 0 refills | Status: DC | PRN

## 2019-01-26 MED ORDER — KETOROLAC TROMETHAMINE 30 MG/ML IJ SOLN
30.0000 mg | Freq: Once | INTRAMUSCULAR | Status: AC
  Administered 2019-01-26: 30 mg via INTRAMUSCULAR
  Filled 2019-01-26: qty 1

## 2019-01-26 MED ORDER — HYDROCODONE-ACETAMINOPHEN 5-325 MG PO TABS: 1.0000 | ORAL_TABLET | Freq: Four times a day (QID) | ORAL | 0 refills | Status: AC | PRN

## 2019-01-26 MED ORDER — CLINDAMYCIN HCL 150 MG PO CAPS
300.0000 mg | ORAL_CAPSULE | Freq: Once | ORAL | Status: AC
  Administered 2019-01-26: 300 mg via ORAL
  Filled 2019-01-26: qty 2

## 2019-01-26 MED ORDER — ONDANSETRON HCL 4 MG PO TABS: 4.0000 mg | ORAL_TABLET | Freq: Three times a day (TID) | ORAL | 0 refills | Status: DC | PRN

## 2019-01-26 MED ORDER — ONDANSETRON HCL 4 MG PO TABS: 4.0000 mg | ORAL_TABLET | Freq: Three times a day (TID) | ORAL | 0 refills | Status: AC | PRN

## 2019-01-26 NOTE — ED Triage Notes (Signed)
Pt complains of left upper tooth pain since last pm. Pt states began to have left upper jaw swelling tonight. Swelling noted, pt is able to speak in full sentences, airway patent. No distress noted.

## 2019-01-26 NOTE — ED Provider Notes (Addendum)
[facility] Emergency Department Provider Note  ____________________________________________  Time seen: Approximately 10:52 PM  I have reviewed the triage vital signs and the nursing notes.   HISTORY  Chief Complaint Facial Swelling and Dental Pain    HPI Justin Moore is a 53 y.o. male presents to the emergency department with left upper jaw swelling that started tonight.  Patient states that he has had pain around superior 15 that recently lasted For approximately 2 days but did not have any facial swelling.  Patient denies fever at home.  No pain underneath the tongue or difficulty swallowing.  Patient denies swelling of the neck.  Patient states he is not experiencing more symptoms in the past anticipates following up with a local dentist this week.  Patient is here for pain management.        Past Medical History:  Diagnosis Date  . Hypertension     There are no active problems to display for this patient.   No past surgical history on file.  Prior to Admission medications   Medication Sig Start Date End Date Taking? Authorizing Provider  amLODipine (NORVASC) 10 MG tablet Take 1 tablet (10 mg total) by mouth daily. 01/26/19 01/26/20  Orvil FeilWoods, Jaylynn Mcaleer M, PA-C  Aspirin-Salicylamide-Caffeine (BC HEADACHE POWDER PO) Take 3 packets by mouth daily as needed (pain).    [provider]  atorvastatin (LIPITOR) 40 MG tablet Take 1 tablet (40 mg total) by mouth daily. 08/31/17   Bing NeighborsHarris, Kimberly S, FNP  Butalbital-APAP-Caff-Cod 50-300-40-30 MG CAPS Take 1-2 tablets by mouth every 6 (six) hours as needed. 08/25/17   Bing NeighborsHarris, Kimberly S, FNP  HYDROcodone-acetaminophen (NORCO) 5-325 MG tablet Take 1 tablet by mouth every 6 (six) hours as needed for up to 3 days for moderate pain. 01/26/19 01/29/19  Orvil FeilWoods, Srinidhi Landers M, PA-C  hydrOXYzine (ATARAX/VISTARIL) 25 MG tablet Take 1 tablet (25 mg total) by mouth every 6 (six) hours as needed for anxiety (sleep). Patient not  taking: Reported on 08/25/2017 07/26/17   Bethann BerkshireZammit, Joseph, MD  labetalol (NORMODYNE) 100 MG tablet Take 1 tablet (100 mg total) by mouth 2 (two) times daily. 08/25/17   Bing NeighborsHarris, Kimberly S, FNP  lisinopril (PRINIVIL,ZESTRIL) 20 MG tablet Take 1 tablet (20 mg total) by mouth daily. 07/26/17   Bethann BerkshireZammit, Joseph, MD  ondansetron (ZOFRAN) 4 MG tablet Take 1 tablet (4 mg total) by mouth every 8 (eight) hours as needed for up to 5 days for nausea or vomiting. 01/26/19 01/31/19  Orvil FeilWoods, Josette Shimabukuro M, PA-C  traMADol (ULTRAM) 50 MG tablet Take 1 tablet (50 mg total) by mouth every 6 (six) hours as needed. Patient not taking: Reported on 08/25/2017 07/26/17   Bethann BerkshireZammit, Joseph, MD    Allergies Patient has no known allergies.  Family History  Problem Relation Age of Onset  . Hypertension Mother   . Heart attack Father   . Hypertension Father     Social History Social History   Tobacco Use  . Smoking status: Current Every Day Smoker    Packs/day: 0.50  . Smokeless tobacco: Never Used  Substance Use Topics  . Alcohol use: Yes  . Drug use: Never     Review of Systems  Constitutional: No fever/chills Eyes: No visual changes. No discharge ENT: Patient has dental abscess.  Cardiovascular: no chest pain. Respiratory: no cough. No SOB. Gastrointestinal: No abdominal pain.  No nausea, no vomiting.  No diarrhea.  No constipation. Genitourinary: Negative for dysuria. No hematuria Musculoskeletal: Negative for musculoskeletal pain. Skin:  Negative for rash, abrasions, lacerations, ecchymosis. Neurological: Negative for headaches, focal weakness or numbness.   ____________________________________________   PHYSICAL EXAM:  VITAL SIGNS: ED Triage Vitals  Enc Vitals Group     BP 01/26/19 2046 (!) 202/83     Pulse Rate 01/26/19 2046 78     Resp 01/26/19 2046 18     Temp 01/26/19 2046 99.2 F (37.3 C)     Temp Source 01/26/19 2046 Oral     SpO2 01/26/19 2046 96 %     Weight 01/26/19 2047 225 lb (102.1 kg)      Height 01/26/19 2047 5\' 10"  (1.778 m)     Head Circumference --      Peak Flow --      Pain Score 01/26/19 2046 8     Pain Loc --      Pain Edu? --      Excl. in [place]? --      Constitutional: Alert and oriented. Well appearing and in no acute distress. Eyes: Conjunctivae are normal. PERRL. EOMI. Head: Atraumatic. ENT      Nose: No congestion/rhinnorhea.      Mouth/Throat: Mucous membranes are moist.  Patient has left upper jaw swelling.  No pain to palpation underneath the tongue.  No swelling at the neck. Neck: No stridor.  No cervical spine tenderness to palpation. Hematological/Lymphatic/Immunilogical: Palpable cervical lymphadenopathy.  Cardiovascular: Normal rate, regular rhythm. Normal S1 and S2.  Good peripheral circulation. Respiratory: Normal respiratory effort without tachypnea or retractions. Lungs CTAB. Good air entry to the bases with no decreased or absent breath sounds. Skin:  Skin is warm, dry and intact. No rash noted. Psychiatric: Mood and affect are normal. Speech and behavior are normal. Patient exhibits appropriate insight and judgement.   ____________________________________________   LABS (all labs ordered are listed, but only abnormal results are displayed)  Labs Reviewed - No data to display ____________________________________________  EKG   ____________________________________________  RADIOLOGY   No results found.  ____________________________________________    PROCEDURES  Procedure(s) performed:    Procedures    Medications  amLODipine (NORVASC) tablet 10 mg (has no administration in time range)  oxyCODONE-acetaminophen (PERCOCET/ROXICET) 5-325 MG per tablet 1 tablet (1 tablet Oral Given 01/26/19 2302)  ketorolac (TORADOL) 30 MG/ML injection 30 mg (30 mg Intramuscular Given 01/26/19 2305)  ondansetron (ZOFRAN-ODT) disintegrating tablet 4 mg (4 mg Oral Given 01/26/19 2303)  clindamycin (CLEOCIN) capsule 300 mg (300 mg Oral Given 01/26/19  2303)     ____________________________________________   INITIAL IMPRESSION / ASSESSMENT AND PLAN / ED COURSE  Pertinent labs & imaging results that were available during my care of the patient were reviewed by me and considered in my medical decision making (see chart for details).  Review of the [place] CSRS was performed in accordance of the [place] prior to dispensing any controlled drugs.           Assessment and plan Dental abscess Hypertension 53 year old male presents to the emergency department with left upper jaw swelling that has occurred for the past 3 hours.  Patient reports dental pain around superior 15 which recently lost a cap.  Patient has exceptionally healthy dentition overall.  Patient has a score of 0 and [employer].  He was given Percocet in the emergency department and discharged with a short course of Norco.  Patient states that he has been noncompliant with his hypertension medications for approximately a year.  Patient was restarted on amlodipine.  He was given 10  mg of amlodipine in the emergency department.  He was discharged with amlodipine.  He was advised to establish care with a primary care provider to address hypertension.  He was advised to follow-up with a local dentist as soon as possible.  All patient questions were answered.    ____________________________________________  FINAL CLINICAL IMPRESSION(S) / ED DIAGNOSES  Final diagnoses:  Dental abscess      NEW MEDICATIONS STARTED DURING THIS VISIT:  ED Discharge Orders         Ordered    amLODipine (NORVASC) 10 MG tablet  Daily,   Status:  Discontinued     01/26/19 2332    HYDROcodone-acetaminophen (NORCO) 5-325 MG tablet  Every 6 hours PRN,   Status:  Discontinued     01/26/19 2332    ondansetron (ZOFRAN) 4 MG tablet  Every 8 hours PRN,   Status:  Discontinued     01/26/19 2332    amLODipine (NORVASC) 10 MG tablet  Daily     01/26/19 2334    HYDROcodone-acetaminophen  (NORCO) 5-325 MG tablet  Every 6 hours PRN     01/26/19 2334    ondansetron (ZOFRAN) 4 MG tablet  Every 8 hours PRN     01/26/19 2334              This chart was dictated using voice recognition software/Dragon. Despite best efforts to proofread, errors can occur which can change the meaning. Any change was purely unintentional.    Orvil FeilWoods, Manahil Vanzile M, PA-C 01/26/19 2336    Pia MauWoods, Kayron Hicklin JacksonM, PA-C 01/26/19 2340    Phineas SemenGoodman, [place], MD 01/27/19 [phone]

## 2019-01-26 NOTE — Discharge Instructions (Addendum)
OPTIONS FOR DENTAL FOLLOW UP CARE ° °[place] Department of Health and Human Services - Local Safety Net Dental Clinics °http://www.ncdhhs.gov/dph/oralhealth/services/safetynetclinics.htm °  °[facility] (336-562-3123) ° °[facility] [place] ([phone]) ° °[facility] (919-663-1744 ext [phone]) ° °[facility] ([phone]) ° °[facility] ([phone]) °This clinic caters to the indigent population and is on a lottery system. °Location: °[facility], [address], [place] °Clinic Hours: °Wednesdays from 6pm - 9pm, patients seen by a lottery system. °For dates, call or go to [email or web address] °Services: °Cleanings, fillings and simple extractions. °Payment Options: °DENTAL WORK IS FREE OF CHARGE. Bring proof of income or support. °Best way to get seen: °Arrive at 5:15 pm - this is a lottery, NOT first come/first serve, so arriving earlier will not increase your chances of being seen. °  °  °[facility] °[phone] °Select option 1 for emergencies °  °Location: °[facility], [address], [place] °Clinic Hours: °No walk-ins accepted - call the day before to schedule an appointment. °Check in times are 9:30 am and 1:30 pm. °Services: °Simple extractions, temporary fillings, pulpectomy/pulp debridement, uncomplicated abscess drainage. °Payment Options: °PAYMENT IS DUE AT THE TIME OF SERVICE.  Fee is usually $100-200, additional surgical procedures (e.g. abscess drainage) may be extra. °Cash, checks, Visa/MasterCard accepted.  Can file Medicaid if patient is covered for dental - patient should call case worker to check. °No discount for [facility] patients. °Best way to get seen: °MUST call the day before and get onto the schedule. Can usually be seen the next 1-2 days. No walk-ins accepted. °  °  °[facility] °[phone] °   °Location: °[facility], [address], [place] °Clinic Hours: °M, W, Th, F 8am or 1:30pm, Tues 9a or 1:30 - first come/first served. °Services: °Simple extractions, temporary fillings, uncomplicated abscess drainage.  You do not need to be an [place] resident. °Payment Options: °PAYMENT IS DUE AT THE TIME OF SERVICE. °Dental insurance, otherwise sliding scale - bring proof of income or support. °Depending on income and treatment needed, cost is usually $50-200. °Best way to get seen: °Arrive early as it is first come/first served. °  °  °[facility] °[phone] °  °Location: °[address] °Clinic Hours: °Mon-Thu 8a-5p °Services: °Most basic dental services including extractions and fillings. °Payment Options: °PAYMENT IS DUE AT THE TIME OF SERVICE. °Sliding scale, up to 50% off - bring proof if income or support. °Medicaid with dental option accepted. °Best way to get seen: °Call to schedule an appointment, can usually be seen within 2 weeks OR they will try to see walk-ins - show up at 8a or 2p (you may have to wait). °  °  °[facility] °[phone] °[place] RESIDENTS ONLY °  °Location: °[facility], [address], [place],  [address] °Clinic Hours: By appointment only. °Monday - Thursday 8am-5pm, Friday 8am-12pm °Services: Cleanings, fillings, extractions. °Payment Options: °PAYMENT IS DUE AT THE TIME OF SERVICE. °Cash, Visa or MasterCard. Sliding scale - $30 minimum per service. °Best way to get seen: °Come in to office, complete packet and make an appointment - need proof of income °or support monies for each household member and proof of [place] residence. °Usually takes about a month to get in. °  °  °[facility] °[phone] °  °Location: °[address]   [place] °Clinic Hours: Walk-in Urgent Care Dental Services are offered Monday-Friday  mornings only. °The numbers of emergencies accepted daily is limited to the number of °providers available. °Maximum 15 - Mondays, Wednesdays & Thursdays °Maximum 10 - Tuesdays & Fridays °Services: °You do not need to be a [place] resident to be seen for a dental emergency. °Emergencies are defined as pain, swelling, abnormal bleeding, or dental trauma. Walkins will receive x-rays if needed. °NOTE: Dental cleaning is not an emergency. °Payment Options: °PAYMENT IS DUE AT THE TIME OF SERVICE. °Minimum co-pay is $40.00 for uninsured patients. °Minimum co-pay is $3.00 for Medicaid with dental coverage. °Dental Insurance is accepted and must be presented at time of visit. °Medicare does not cover dental. °Forms of payment: Cash, credit card, checks. °Best way to get seen: °If not previously registered with the clinic, walk-in dental registration begins at 7:15 am and is on a first come/first serve basis. °If previously registered with the clinic, call to make an appointment. °  °  °[facility] °[phone] °[place] RESIDENTS ONLY °  °Location: °[address], [place], [place] °Clinic Hours: °Mon-Thu 10a-2p °Services: Extractions only! °Payment Options: °FREE (donations accepted) - bring proof of income or support °Best way to get seen: °Call and schedule an appointment OR come at 8am on the 1st Monday of every month (except for holidays) when it is first come/first served. °  °  °[facility] °[phone] °  °Location: °[address], [place] °Clinic Hours: °Friday mornings °Services, Payment Options, Best way to get seen: °Call for info °

## 2019-10-28 ENCOUNTER — Ambulatory Visit: Payer: Self-pay

## 2019-11-28 ENCOUNTER — Other Ambulatory Visit: Payer: Self-pay

## 2019-11-28 ENCOUNTER — Ambulatory Visit: Payer: Self-pay | Admitting: Gerontology

## 2019-11-28 ENCOUNTER — Encounter: Payer: Self-pay | Admitting: Gerontology

## 2019-11-28 VITALS — BP 128/85 | HR 88 | Ht 69.0 in | Wt 213.0 lb

## 2019-11-28 DIAGNOSIS — I10 Essential (primary) hypertension: Secondary | ICD-10-CM

## 2019-11-28 DIAGNOSIS — Z7689 Persons encountering health services in other specified circumstances: Secondary | ICD-10-CM

## 2019-11-28 MED ORDER — BLOOD PRESSURE MONITOR KIT
1.0000 | PACK | Freq: Every day | 0 refills | Status: DC
Start: 2019-11-28 — End: 2024-04-22

## 2019-11-28 MED ORDER — AMLODIPINE BESYLATE 10 MG PO TABS: 10.0000 mg | ORAL_TABLET | Freq: Every day | ORAL | 3 refills | Status: DC

## 2019-11-28 NOTE — Patient Instructions (Addendum)
Smoking Tobacco Information, Adult °Smoking tobacco can be harmful to your health. Tobacco contains a poisonous (toxic), colorless chemical called nicotine. Nicotine is addictive. It changes the brain and can make it hard to stop smoking. Tobacco also has other toxic chemicals that can hurt your body and raise your risk of many cancers. °How can smoking tobacco affect me? °Smoking tobacco puts you at risk for: °· Cancer. Smoking is most commonly associated with lung cancer, but can also lead to cancer in other parts of the body. °· Chronic obstructive pulmonary disease (COPD). This is a long-term lung condition that makes it hard to breathe. It also gets worse over time. °· High blood pressure (hypertension), heart disease, stroke, or heart attack. °· Lung infections, such as pneumonia. °· Cataracts. This is when the lenses in the eyes become clouded. °· Digestive problems. This may include peptic ulcers, heartburn, and gastroesophageal reflux disease (GERD). °· Oral health problems, such as gum disease and tooth loss. °· Loss of taste and smell. °Smoking can affect your appearance by causing: °· Wrinkles. °· Yellow or stained teeth, fingers, and fingernails. °Smoking tobacco can also affect your social life, because: °· It may be challenging to find places to smoke when away from home. Many workplaces, restaurants, hotels, and public places are tobacco-free. °· Smoking is expensive. This is due to the cost of tobacco and the long-term costs of treating health problems from smoking. °· Secondhand smoke may affect those around you. Secondhand smoke can cause lung cancer, breathing problems, and heart disease. Children of smokers have a higher risk for: °? Sudden infant death syndrome (SIDS). °? Ear infections. °? Lung infections. °If you currently smoke tobacco, quitting now can help you: °· Lead a longer and healthier life. °· Look, smell, breathe, and feel better over time. °· Save money. °· Protect others from the  harms of secondhand smoke. °What actions can I take to prevent health problems? °Quit smoking ° °· Do not start smoking. Quit if you already do. °· Make a plan to quit smoking and commit to it. Look for programs to help you and ask your health care provider for recommendations and ideas. °· Set a date and write down all the reasons you want to quit. °· Let your friends and family know you are quitting so they can help and support you. Consider finding friends who also want to quit. It can be easier to quit with someone else, so that you can support each other. °· Talk with your health care provider about using nicotine replacement medicines to help you quit, such as gum, lozenges, patches, sprays, or pills. °· Do not replace cigarette smoking with electronic cigarettes, which are commonly called e-cigarettes. The safety of e-cigarettes is not known, and some may contain harmful chemicals. °· If you try to quit but return to smoking, stay positive. It is common to slip up when you first quit, so take it one day at a time. °· Be prepared for cravings. When you feel the urge to smoke, chew gum or suck on hard candy. °Lifestyle °· Stay busy and take care of your body. °· Drink enough fluid to keep your urine pale yellow. °· Get plenty of exercise and eat a healthy diet. This can help prevent weight gain after quitting. °· Monitor your eating habits. Quitting smoking can cause you to have a larger appetite than when you smoke. °· Find ways to relax. Go out with friends or family to a movie or a restaurant   where people do not smoke. °· Ask your health care provider about having regular tests (screenings) to check for cancer. This may include blood tests, imaging tests, and other tests. °· Find ways to manage your stress, such as meditation, yoga, or exercise. °Where to find support °To get support to quit smoking, consider: °· Asking your health care provider for more information and resources. °· Taking classes to learn  more about quitting smoking. °· Looking for local organizations that offer resources about quitting smoking. °· Joining a support group for people who want to quit smoking in your local community. °· Calling the smokefree.gov counselor helpline: 1-800-Quit-Now (1-800-784-8669) °Where to find more information °You may find more information about quitting smoking from: °· HelpGuide.org: www.helpguide.org °· Smokefree.gov: smokefree.gov °· American Lung Association: www.lung.org °Contact a health care provider if you: °· Have problems breathing. °· Notice that your lips, nose, or fingers turn blue. °· Have chest pain. °· Are coughing up blood. °· Feel faint or you pass out. °· Have other health changes that cause you to worry. °Summary °· Smoking tobacco can negatively affect your health, the health of those around you, your finances, and your social life. °· Do not start smoking. Quit if you already do. If you need help quitting, ask your health care provider. °· Think about joining a support group for people who want to quit smoking in your local community. There are many effective programs that will help you to quit this behavior. °This information is not intended to replace advice given to you by your health care provider. Make sure you discuss any questions you have with your health care provider. °Document Revised: 04/05/2019 Document Reviewed: 07/26/2016 °Elsevier Patient Education © 2020 Elsevier Inc. °DASH Eating Plan °DASH stands for "Dietary Approaches to Stop Hypertension." The DASH eating plan is a healthy eating plan that has been shown to reduce high blood pressure (hypertension). It may also reduce your risk for type 2 diabetes, heart disease, and stroke. The DASH eating plan may also help with weight loss. °What are tips for following this plan? ° °General guidelines °· Avoid eating more than 2,300 mg (milligrams) of salt (sodium) a day. If you have hypertension, you may need to reduce your sodium  intake to 1,500 mg a day. °· Limit alcohol intake to no more than 1 drink a day for nonpregnant women and 2 drinks a day for men. One drink equals 12 oz of beer, 5 oz of wine, or 1½ oz of hard liquor. °· Work with your health care provider to maintain a healthy body weight or to lose weight. Ask what an ideal weight is for you. °· Get at least 30 minutes of exercise that causes your heart to beat faster (aerobic exercise) most days of the week. Activities may include walking, swimming, or biking. °· Work with your health care provider or diet and nutrition specialist (dietitian) to adjust your eating plan to your individual calorie needs. °Reading food labels ° °· Check food labels for the amount of sodium per serving. Choose foods with less than 5 percent of the Daily Value of sodium. Generally, foods with less than 300 mg of sodium per serving fit into this eating plan. °· To find whole grains, look for the word "whole" as the first word in the ingredient list. °Shopping °· Buy products labeled as "low-sodium" or "no salt added." °· Buy fresh foods. Avoid canned foods and premade or frozen meals. °Cooking °· Avoid adding salt when cooking. Use   salt-free seasonings or herbs instead of table salt or sea salt. Check with your health care provider or pharmacist before using salt substitutes. °· Do not fry foods. Cook foods using healthy methods such as baking, boiling, grilling, and broiling instead. °· Cook with heart-healthy oils, such as olive, canola, soybean, or sunflower oil. °Meal planning °· Eat a balanced diet that includes: °? 5 or more servings of fruits and vegetables each day. At each meal, try to fill half of your plate with fruits and vegetables. °? Up to 6-8 servings of whole grains each day. °? Less than 6 oz of lean meat, poultry, or fish each day. A 3-oz serving of meat is about the same size as a deck of cards. One egg equals 1 oz. °? 2 servings of low-fat dairy each day. °? A serving of nuts,  seeds, or beans 5 times each week. °? Heart-healthy fats. Healthy fats called Omega-3 fatty acids are found in foods such as flaxseeds and coldwater fish, like sardines, salmon, and mackerel. °· Limit how much you eat of the following: °? Canned or prepackaged foods. °? Food that is high in trans fat, such as fried foods. °? Food that is high in saturated fat, such as fatty meat. °? Sweets, desserts, sugary drinks, and other foods with added sugar. °? Full-fat dairy products. °· Do not salt foods before eating. °· Try to eat at least 2 vegetarian meals each week. °· Eat more home-cooked food and less restaurant, buffet, and fast food. °· When eating at a restaurant, ask that your food be prepared with less salt or no salt, if possible. °What foods are recommended? °The items listed may not be a complete list. Talk with your dietitian about what dietary choices are best for you. °Grains °Whole-grain or whole-wheat bread. Whole-grain or whole-wheat pasta. Brown rice. Oatmeal. Quinoa. Bulgur. Whole-grain and low-sodium cereals. Pita bread. Low-fat, low-sodium crackers. Whole-wheat flour tortillas. °Vegetables °Fresh or frozen vegetables (raw, steamed, roasted, or grilled). Low-sodium or reduced-sodium tomato and vegetable juice. Low-sodium or reduced-sodium tomato sauce and tomato paste. Low-sodium or reduced-sodium canned vegetables. °Fruits °All fresh, dried, or frozen fruit. Canned fruit in natural juice (without added sugar). °Meat and other protein foods °Skinless chicken or turkey. Ground chicken or turkey. Pork with fat trimmed off. Fish and seafood. Egg whites. Dried beans, peas, or lentils. Unsalted nuts, nut butters, and seeds. Unsalted canned beans. Lean cuts of beef with fat trimmed off. Low-sodium, lean deli meat. °Dairy °Low-fat (1%) or fat-free (skim) milk. Fat-free, low-fat, or reduced-fat cheeses. Nonfat, low-sodium ricotta or cottage cheese. Low-fat or nonfat yogurt. Low-fat, low-sodium cheese. °Fats  and oils °Soft margarine without trans fats. Vegetable oil. Low-fat, reduced-fat, or light mayonnaise and salad dressings (reduced-sodium). Canola, safflower, olive, soybean, and sunflower oils. Avocado. °Seasoning and other foods °Herbs. Spices. Seasoning mixes without salt. Unsalted popcorn and pretzels. Fat-free sweets. °What foods are not recommended? °The items listed may not be a complete list. Talk with your dietitian about what dietary choices are best for you. °Grains °Baked goods made with fat, such as croissants, muffins, or some breads. Dry pasta or rice meal packs. °Vegetables °Creamed or fried vegetables. Vegetables in a cheese sauce. Regular canned vegetables (not low-sodium or reduced-sodium). Regular canned tomato sauce and paste (not low-sodium or reduced-sodium). Regular tomato and vegetable juice (not low-sodium or reduced-sodium). Pickles. Olives. °Fruits °Canned fruit in a light or heavy syrup. Fried fruit. Fruit in cream or butter sauce. °Meat and other protein foods °Fatty cuts   of meat. Ribs. Fried meat. Bacon. Sausage. Bologna and other processed lunch meats. Salami. Fatback. Hotdogs. Bratwurst. Salted nuts and seeds. Canned beans with added salt. Canned or smoked fish. Whole eggs or egg yolks. Chicken or turkey with skin. °Dairy °Whole or 2% milk, cream, and half-and-half. Whole or full-fat cream cheese. Whole-fat or sweetened yogurt. Full-fat cheese. Nondairy creamers. Whipped toppings. Processed cheese and cheese spreads. °Fats and oils °Butter. Stick margarine. Lard. Shortening. Ghee. Bacon fat. Tropical oils, such as coconut, palm kernel, or palm oil. °Seasoning and other foods °Salted popcorn and pretzels. Onion salt, garlic salt, seasoned salt, table salt, and sea salt. Worcestershire sauce. Tartar sauce. Barbecue sauce. Teriyaki sauce. Soy sauce, including reduced-sodium. Steak sauce. Canned and packaged gravies. Fish sauce. Oyster sauce. Cocktail sauce. Horseradish that you find on  the shelf. Ketchup. Mustard. Meat flavorings and tenderizers. Bouillon cubes. Hot sauce and Tabasco sauce. Premade or packaged marinades. Premade or packaged taco seasonings. Relishes. Regular salad dressings. °Where to find more information: °· National Heart, Lung, and Blood Institute: www.nhlbi.nih.gov °· American Heart Association: www.heart.org °Summary °· The DASH eating plan is a healthy eating plan that has been shown to reduce high blood pressure (hypertension). It may also reduce your risk for type 2 diabetes, heart disease, and stroke. °· With the DASH eating plan, you should limit salt (sodium) intake to 2,300 mg a day. If you have hypertension, you may need to reduce your sodium intake to 1,500 mg a day. °· When on the DASH eating plan, aim to eat more fresh fruits and vegetables, whole grains, lean proteins, low-fat dairy, and heart-healthy fats. °· Work with your health care provider or diet and nutrition specialist (dietitian) to adjust your eating plan to your individual calorie needs. °This information is not intended to replace advice given to you by your health care provider. Make sure you discuss any questions you have with your health care provider. °Document Revised: 06/23/2017 Document Reviewed: 07/04/2016 °Elsevier Patient Education © 2020 Elsevier Inc. ° °

## 2019-11-28 NOTE — Progress Notes (Signed)
Patient ID: Justin Moore, male   DOB: [birth year], 54 y.o.   MRN: [id number]  Chief Complaint  Patient presents with  . Establish Care  . Hypertension    HPI Justin Moore is a 54 y.o. male who presents to establish care and evaluation of his chronic problems. He has a history of hypertension and started taking10 mg of Amlodipine a month ago. He does not check his blood pressure at home and reports that he's working on making healthy life style changes. He states that he smokes 1 pack of cigarette daily and admits the desire to quit. Overall, he states that he's doing well and offers no further complaint.   Past Medical History:  Diagnosis Date  . Hypertension     No past surgical history on file.  Family History  Problem Relation Age of Onset  . Hypertension Mother   . Heart attack Father   . Hypertension Father     Social History Social History   Tobacco Use  . Smoking status: Current Every Day Smoker    Packs/day: 0.50  . Smokeless tobacco: Never Used  Substance Use Topics  . Alcohol use: Not Currently  . Drug use: Never    No Known Allergies  Current Outpatient Medications  Medication Sig Dispense Refill  . amLODipine (NORVASC) 10 MG tablet Take 1 tablet (10 mg total) by mouth daily. 30 tablet 3  . magnesium oxide (MAG-OX) 400 MG tablet Take 400 mg by mouth daily.    Marland Kitchen MELATONIN ER PO Take by mouth.    . Aspirin-Salicylamide-Caffeine (BC HEADACHE POWDER PO) Take 3 packets by mouth daily as needed (pain).    . Blood Pressure Monitor KIT 1 kit by Does not apply route daily. 1 kit 0   No current facility-administered medications for this visit.    Review of Systems Review of Systems  Constitutional: Negative.   HENT: Negative.   Eyes: Negative.   Respiratory: Negative.   Cardiovascular: Negative.   Gastrointestinal: Negative.   Endocrine: Negative.   Genitourinary: Negative.   Musculoskeletal: Negative.   Skin: Negative.   Neurological: Negative.    Hematological: Negative.   Psychiatric/Behavioral: Negative.     Blood pressure 128/85, pulse 88, height 5' 9"  (1.753 m), weight 213 lb (96.6 kg), SpO2 96 %.  Physical Exam Physical Exam HENT:     Head: Normocephalic and atraumatic.     Nose:     Comments: Deferred per Covid protocol    Mouth/Throat:     Comments: Deferred per Covid protocol Eyes:     Extraocular Movements: Extraocular movements intact.     Pupils: Pupils are equal, round, and reactive to light.  Cardiovascular:     Rate and Rhythm: Normal rate and regular rhythm.     Pulses: Normal pulses.     Heart sounds: Normal heart sounds.  Pulmonary:     Effort: Pulmonary effort is normal.     Breath sounds: Normal breath sounds.  Abdominal:     General: Abdomen is flat. Bowel sounds are normal.     Palpations: Abdomen is soft.  Genitourinary:    Comments: Deferred per patient Musculoskeletal:        General: Normal range of motion.     Cervical back: Normal range of motion.  Skin:    General: Skin is warm and dry.  Neurological:     General: No focal deficit present.     Mental Status: He is alert and oriented to person, place, and time.  Mental status is at baseline.  Psychiatric:        Mood and Affect: Mood normal.        Behavior: Behavior normal.        Thought Content: Thought content normal.        Judgment: Judgment normal.     Data Reviewed Labs and past medical history will be checked.  Assessment and Plan  1. Encounter to establish care Routine labs will be rechecked - Lipid panel; Future - Comp Met (CMET); Future - CBC w/Diff; Future - HgB A1c; Future - Urinalysis; Future - Urinalysis - HgB A1c - CBC w/Diff - Comp Met (CMET) - Lipid panel  2. Essential hypertension - He was provided with blood pressure machine and was advised to check his blood pressure, record and bring log to follow up appointment. He will continue on current medication, advised on smoking cessation , continue on  DASH diet and exercise as tolerated. - amLODipine (NORVASC) 10 MG tablet; Take 1 tablet (10 mg total) by mouth daily.  Dispense: 30 tablet; Refill: 3 - Blood Pressure Monitor KIT; 1 kit by Does not apply route daily.  Dispense: 1 kit; Refill: 0   Follow up: 12/05/2019 or if symptom worsens or fail to improve.  Carlee Tesfaye E Kamariah Fruchter 11/29/2019, 7:48 PM

## 2019-12-04 ENCOUNTER — Ambulatory Visit: Payer: Self-pay | Admitting: Gerontology

## 2019-12-04 ENCOUNTER — Other Ambulatory Visit: Payer: Self-pay

## 2019-12-04 VITALS — BP 153/90 | HR 89 | Temp 97.4°F | Ht 69.0 in | Wt 220.7 lb

## 2019-12-04 DIAGNOSIS — Z7689 Persons encountering health services in other specified circumstances: Secondary | ICD-10-CM

## 2019-12-05 ENCOUNTER — Ambulatory Visit: Payer: Self-pay | Admitting: Gerontology

## 2019-12-05 ENCOUNTER — Encounter: Payer: Self-pay | Admitting: Gerontology

## 2019-12-05 VITALS — BP 146/95 | HR 85 | Ht 69.0 in | Wt 223.0 lb

## 2019-12-05 DIAGNOSIS — F17200 Nicotine dependence, unspecified, uncomplicated: Secondary | ICD-10-CM

## 2019-12-05 DIAGNOSIS — E785 Hyperlipidemia, unspecified: Secondary | ICD-10-CM | POA: Insufficient documentation

## 2019-12-05 DIAGNOSIS — I10 Essential (primary) hypertension: Secondary | ICD-10-CM

## 2019-12-05 DIAGNOSIS — D72829 Elevated white blood cell count, unspecified: Secondary | ICD-10-CM | POA: Insufficient documentation

## 2019-12-05 LAB — COMPREHENSIVE METABOLIC PANEL
ALT: 46 IU/L — ABNORMAL HIGH (ref 0–44)
AST: 21 IU/L (ref 0–40)
Albumin/Globulin Ratio: 1.4 (ref 1.2–2.2)
Albumin: 4.6 g/dL (ref 3.8–4.9)
Alkaline Phosphatase: 93 IU/L (ref 39–117)
BUN/Creatinine Ratio: 18 (ref 9–20)
BUN: 21 mg/dL (ref 6–24)
Bilirubin Total: <0.2 mg/dL (ref 0.0–1.2)
CO2: 19 mmol/L — ABNORMAL LOW (ref 20–29)
Calcium: 9.8 mg/dL (ref 8.7–10.2)
Chloride: 105 mmol/L (ref 96–106)
Creatinine, Ser: 1.18 mg/dL (ref 0.76–1.27)
GFR calc Af Amer: 82 mL/min/{1.73_m2} (ref >59)
GFR calc non Af Amer: 71 mL/min/{1.73_m2} (ref >59)
Globulin, Total: 3.2 g/dL (ref 1.5–4.5)
Glucose: 116 mg/dL — ABNORMAL HIGH (ref 65–99)
Potassium: 4.1 mmol/L (ref 3.5–5.2)
Sodium: 138 mmol/L (ref 134–144)
Total Protein: 7.8 g/dL (ref 6.0–8.5)

## 2019-12-05 LAB — CBC WITH DIFFERENTIAL/PLATELET
Basophils Absolute: 0.1 10*3/uL (ref 0.0–0.2)
Basos: 1 %
EOS (ABSOLUTE): 0.3 10*3/uL (ref 0.0–0.4)
Eos: 2 %
Hematocrit: 42.1 % (ref 37.5–51.0)
Hemoglobin: 15.2 g/dL (ref 13.0–17.7)
Immature Grans (Abs): 0.1 10*3/uL (ref 0.0–0.1)
Immature Granulocytes: 1 %
Lymphocytes Absolute: 3.4 10*3/uL — ABNORMAL HIGH (ref 0.7–3.1)
Lymphs: 26 %
MCH: 33.4 pg — ABNORMAL HIGH (ref 26.6–33.0)
MCHC: 36.1 g/dL — ABNORMAL HIGH (ref 31.5–35.7)
MCV: 93 fL (ref 79–97)
Monocytes Absolute: 0.7 10*3/uL (ref 0.1–0.9)
Monocytes: 6 %
Neutrophils Absolute: 8.4 10*3/uL — ABNORMAL HIGH (ref 1.4–7.0)
Neutrophils: 64 %
Platelets: 300 10*3/uL (ref 150–450)
RBC: 4.55 x10E6/uL (ref 4.14–5.80)
RDW: 12.7 % (ref 11.6–15.4)
WBC: 13 10*3/uL — ABNORMAL HIGH (ref 3.4–10.8)

## 2019-12-05 LAB — URINALYSIS
Bilirubin, UA: NEGATIVE
Glucose, UA: NEGATIVE
Ketones, UA: NEGATIVE
Leukocytes,UA: NEGATIVE
Nitrite, UA: NEGATIVE
Protein,UA: NEGATIVE
RBC, UA: NEGATIVE
Specific Gravity, UA: 1.01 (ref 1.005–1.030)
Urobilinogen, Ur: 0.2 mg/dL (ref 0.2–1.0)
pH, UA: 5 (ref 5.0–7.5)

## 2019-12-05 LAB — HEMOGLOBIN A1C
Est. average glucose Bld gHb Est-mCnc: 114 mg/dL
Hgb A1c MFr Bld: 5.6 % (ref 4.8–5.6)

## 2019-12-05 LAB — LIPID PANEL
Chol/HDL Ratio: 6 ratio — ABNORMAL HIGH (ref 0.0–5.0)
Cholesterol, Total: 240 mg/dL — ABNORMAL HIGH (ref 100–199)
HDL: 40 mg/dL (ref >39)
LDL Chol Calc (NIH): 156 mg/dL — ABNORMAL HIGH (ref 0–99)
Triglycerides: 237 mg/dL — ABNORMAL HIGH (ref 0–149)
VLDL Cholesterol Cal: 44 mg/dL — ABNORMAL HIGH (ref 5–40)

## 2019-12-05 MED ORDER — CHLORTHALIDONE 25 MG PO TABS: 12.5000 mg | ORAL_TABLET | Freq: Every day | ORAL | 0 refills | Status: DC

## 2019-12-05 NOTE — Progress Notes (Signed)
Established Patient Office Visit  Subjective:  Patient ID: Justin Moore, male    DOB: 1965-10-18  Age: 54 y.o. MRN: [id number]  CC:  Chief Complaint  Patient presents with  . review labs    HPI Justin Moore presents for follow up of hypertension and lab review. He states that he's compliant with his medications, checks his blood pressure daily and forgot to bring his log. He continues to smoke 1 pack of cigarette in 2 days and admits the desire to quit. His Lipid panel done on 12/04/2019, Total cholesterol was 240 mg/dl, Triglycerides 237 mg/dl, LDL was 156 mg/dl. He states that he eats lots of fried food and will make some lifestyle modifications. His WBC was 13, he denies fever, chills and any signs of infection. Overall, he states that he's doing well and offers no further complaint.  Past Medical History:  Diagnosis Date  . Hypertension     No past surgical history on file.  Family History  Problem Relation Age of Onset  . Hypertension Mother   . Heart attack Father   . Hypertension Father     Social History   Socioeconomic History  . Marital status: Married    Spouse name: Not on file  . Number of children: Not on file  . Years of education: Not on file  . Highest education level: Not on file  Occupational History  . Not on file  Tobacco Use  . Smoking status: Current Every Day Smoker    Packs/day: 0.50  . Smokeless tobacco: Never Used  Substance and Sexual Activity  . Alcohol use: Not Currently  . Drug use: Never  . Sexual activity: Not on file  Other Topics Concern  . Not on file  Social History Narrative   ** Merged History Encounter **       Social Determinants of Health   Financial Resource Strain:   . Difficulty of Paying Living Expenses:   Food Insecurity:   . Worried About Charity fundraiser in the Last Year:   . Arboriculturist in the Last Year:   Transportation Needs:   . Film/video editor (Medical):   Marland Kitchen Lack of Transportation  (Non-Medical):   Physical Activity:   . Days of Exercise per Week:   . Minutes of Exercise per Session:   Stress:   . Feeling of Stress :   Social Connections:   . Frequency of Communication with Friends and Family:   . Frequency of Social Gatherings with Friends and Family:   . Attends Religious Services:   . Active Member of Clubs or Organizations:   . Attends Archivist Meetings:   Marland Kitchen Marital Status:   Intimate Partner Violence:   . Fear of Current or Ex-Partner:   . Emotionally Abused:   Marland Kitchen Physically Abused:   . Sexually Abused:     Outpatient Medications Prior to Visit  Medication Sig Dispense Refill  . amLODipine (NORVASC) 10 MG tablet Take 1 tablet (10 mg total) by mouth daily. 30 tablet 3  . magnesium oxide (MAG-OX) 400 MG tablet Take 400 mg by mouth daily.    Marland Kitchen MELATONIN ER PO Take by mouth.    . Aspirin-Salicylamide-Caffeine (BC HEADACHE POWDER PO) Take 3 packets by mouth daily as needed (pain).    . Blood Pressure Monitor KIT 1 kit by Does not apply route daily. 1 kit 0   No facility-administered medications prior to visit.    No Known  Allergies  ROS Review of Systems  Constitutional: Negative.   Eyes: Negative.   Respiratory: Negative.   Cardiovascular: Negative.   Neurological: Negative.       Objective:    Physical Exam  Constitutional: He is oriented to person, place, and time. He appears well-developed.  HENT:  Head: Normocephalic and atraumatic.  Eyes: Pupils are equal, round, and reactive to light. EOM are normal.  Cardiovascular: Normal rate and regular rhythm.  Pulmonary/Chest: Effort normal and breath sounds normal.  Neurological: He is alert and oriented to person, place, and time.  Psychiatric: He has a normal mood and affect. His behavior is normal. Judgment and thought content normal.    BP (!) 146/95 (BP Location: Right Arm, Patient Position: Sitting)   Pulse 85   Ht 5' 9"  (1.753 m)   Wt 223 lb (101.2 kg)   SpO2 99%   BMI  32.93 kg/m  Wt Readings from Last 3 Encounters:  12/05/19 223 lb (101.2 kg)  12/04/19 220 lb 11.2 oz (100.1 kg)  11/28/19 213 lb (96.6 kg)   He was advised to continue on his weight loss regimen.  Health Maintenance Due  Topic Date Due  . HIV Screening  Never done  . COVID-19 Vaccine (1) Never done  . TETANUS/TDAP  Never done  . COLONOSCOPY  Never done    There are no preventive care reminders to display for this patient.  Lab Results  Component Value Date   TSH 1.470 08/25/2017   Lab Results  Component Value Date   WBC 13.0 (H) 12/04/2019   HGB 15.2 12/04/2019   HCT 42.1 12/04/2019   MCV 93 12/04/2019   PLT 300 12/04/2019   Lab Results  Component Value Date   NA 138 12/04/2019   K 4.1 12/04/2019   CO2 19 (L) 12/04/2019   GLUCOSE 116 (H) 12/04/2019   BUN 21 12/04/2019   CREATININE 1.18 12/04/2019   BILITOT <0.2 12/04/2019   ALKPHOS 93 12/04/2019   AST 21 12/04/2019   ALT 46 (H) 12/04/2019   PROT 7.8 12/04/2019   ALBUMIN 4.6 12/04/2019   CALCIUM 9.8 12/04/2019   ANIONGAP 10 07/08/2018   Lab Results  Component Value Date   CHOL 240 (H) 12/04/2019   Lab Results  Component Value Date   HDL 40 12/04/2019   Lab Results  Component Value Date   LDLCALC 156 (H) 12/04/2019   Lab Results  Component Value Date   TRIG 237 (H) 12/04/2019   Lab Results  Component Value Date   CHOLHDL 6.0 (H) 12/04/2019   Lab Results  Component Value Date   HGBA1C 5.6 12/04/2019      Assessment & Plan:   1. Essential hypertension - His blood pressure is improving, his goal is less than 140/90. He will continue on 12.5 mg Chlorthalidone, educated on medication side effects and advised to notify clinic. He was advised to check blood pressure 3 times a week, record and bring log to follow up visit, continue on DASH diet and exercise as tolerated. - chlorthalidone (HYGROTON) 25 MG tablet; Take 0.5 tablets (12.5 mg total) by mouth daily.  Dispense: 30 tablet; Refill: 0  2.  Smoking - He was strongly encouraged in smoking cessation and was provided with [place] Quitline information.  3. Elevated lipids - His ASCVD 10 year risk was 21.6%, he declines statin therapy, stating that he will make lifestyle modification. He was encouraged to continue on low fat/low cholesterol diet and exercise as tolerated. - Lipid  panel; Future  4. Leukocytosis, unspecified type - His WBC was 13, he denies fever, chills and no sign of infection. Will recheck - CBC w/Diff; Future     Follow-up: Return in about 4 weeks (around 01/02/2020), or if symptoms worsen or fail to improve.    Lupita Rosales Jerold Coombe, NP

## 2019-12-05 NOTE — Patient Instructions (Signed)
Fat and Cholesterol Restricted Eating Plan Getting too much fat and cholesterol in your diet may cause health problems. Choosing the right foods helps keep your fat and cholesterol at normal levels. This can keep you from getting certain diseases. Your doctor may recommend an eating plan that includes:  Total fat: ______% or less of total calories a day.  Saturated fat: ______% or less of total calories a day.  Cholesterol: less than _________mg a day.  Fiber: ______g a day. What are tips for following this plan? Meal planning  At meals, divide your plate into four equal parts: ? Fill one-half of your plate with vegetables and green salads. ? Fill one-fourth of your plate with whole grains. ? Fill one-fourth of your plate with low-fat (lean) protein foods.  Eat fish that is high in omega-3 fats at least two times a week. This includes mackerel, tuna, sardines, and salmon.  Eat foods that are high in fiber, such as whole grains, beans, apples, broccoli, carrots, peas, and barley. General tips   Work with your doctor to lose weight if you need to.  Avoid: ? Foods with added sugar. ? Fried foods. ? Foods with partially hydrogenated oils.  Limit alcohol intake to no more than 1 drink a day for nonpregnant women and 2 drinks a day for men. One drink equals 12 oz of beer, 5 oz of wine, or 1 oz of hard liquor. Reading food labels  Check food labels for: ? Trans fats. ? Partially hydrogenated oils. ? Saturated fat (g) in each serving. ? Cholesterol (mg) in each serving. ? Fiber (g) in each serving.  Choose foods with healthy fats, such as: ? Monounsaturated fats. ? Polyunsaturated fats. ? Omega-3 fats.  Choose grain products that have whole grains. Look for the word "whole" as the first word in the ingredient list. Cooking  Cook foods using low-fat methods. These include baking, boiling, grilling, and broiling.  Eat more home-cooked foods. Eat at restaurants and buffets  less often.  Avoid cooking using saturated fats, such as butter, cream, palm oil, palm kernel oil, and coconut oil. Recommended foods  Fruits  All fresh, canned (in natural juice), or frozen fruits. Vegetables  Fresh or frozen vegetables (raw, steamed, roasted, or grilled). Green salads. Grains  Whole grains, such as whole wheat or whole grain breads, crackers, cereals, and pasta. Unsweetened oatmeal, bulgur, barley, quinoa, or brown rice. Corn or whole wheat flour tortillas. Meats and other protein foods  Ground beef (85% or leaner), grass-fed beef, or beef trimmed of fat. Skinless chicken or turkey. Ground chicken or turkey. Pork trimmed of fat. All fish and seafood. Egg whites. Dried beans, peas, or lentils. Unsalted nuts or seeds. Unsalted canned beans. Nut butters without added sugar or oil. Dairy  Low-fat or nonfat dairy products, such as skim or 1% milk, 2% or reduced-fat cheeses, low-fat and fat-free ricotta or cottage cheese, or plain low-fat and nonfat yogurt. Fats and oils  Tub margarine without trans fats. Light or reduced-fat mayonnaise and salad dressings. Avocado. Olive, canola, sesame, or safflower oils. The items listed above may not be a complete list of foods and beverages you can eat. Contact a dietitian for more information. Foods to avoid Fruits  Canned fruit in heavy syrup. Fruit in cream or butter sauce. Fried fruit. Vegetables  Vegetables cooked in cheese, cream, or butter sauce. Fried vegetables. Grains  White bread. White pasta. White rice. Cornbread. Bagels, pastries, and croissants. Crackers and snack foods that contain trans fat   and hydrogenated oils. Meats and other protein foods  Fatty cuts of meat. Ribs, chicken wings, bacon, sausage, bologna, salami, chitterlings, fatback, hot dogs, bratwurst, and packaged lunch meats. Liver and organ meats. Whole eggs and egg yolks. Chicken and turkey with skin. Fried meat. Dairy  Whole or 2% milk, cream,  half-and-half, and cream cheese. Whole milk cheeses. Whole-fat or sweetened yogurt. Full-fat cheeses. Nondairy creamers and whipped toppings. Processed cheese, cheese spreads, and cheese curds. Beverages  Alcohol. Sugar-sweetened drinks such as sodas, lemonade, and fruit drinks. Fats and oils  Butter, stick margarine, lard, shortening, ghee, or bacon fat. Coconut, palm kernel, and palm oils. Sweets and desserts  Corn syrup, sugars, honey, and molasses. Candy. Jam and jelly. Syrup. Sweetened cereals. Cookies, pies, cakes, donuts, muffins, and ice cream. The items listed above may not be a complete list of foods and beverages you should avoid. Contact a dietitian for more information. Summary  Choosing the right foods helps keep your fat and cholesterol at normal levels. This can keep you from getting certain diseases.  At meals, fill one-half of your plate with vegetables and green salads.  Eat high-fiber foods, like whole grains, beans, apples, carrots, peas, and barley.  Limit added sugar, saturated fats, alcohol, and fried foods. This information is not intended to replace advice given to you by your health care provider. Make sure you discuss any questions you have with your health care provider. Document Revised: 03/14/2018 Document Reviewed: 03/28/2017 Elsevier Patient Education  2020 [employer] DASH Eating Plan DASH stands for "Dietary Approaches to Stop Hypertension." The DASH eating plan is a healthy eating plan that has been shown to reduce high blood pressure (hypertension). It may also reduce your risk for type 2 diabetes, heart disease, and stroke. The DASH eating plan may also help with weight loss. What are tips for following this plan?  General guidelines  Avoid eating more than 2,300 mg (milligrams) of salt (sodium) a day. If you have hypertension, you may need to reduce your sodium intake to 1,500 mg a day.  Limit alcohol intake to no more than 1 drink a day for  nonpregnant women and 2 drinks a day for men. One drink equals 12 oz of beer, 5 oz of wine, or 1 oz of hard liquor.  Work with your health care provider to maintain a healthy body weight or to lose weight. Ask what an ideal weight is for you.  Get at least 30 minutes of exercise that causes your heart to beat faster (aerobic exercise) most days of the week. Activities may include walking, swimming, or biking.  Work with your health care provider or diet and nutrition specialist (dietitian) to adjust your eating plan to your individual calorie needs. Reading food labels   Check food labels for the amount of sodium per serving. Choose foods with less than 5 percent of the Daily Value of sodium. Generally, foods with less than 300 mg of sodium per serving fit into this eating plan.  To find whole grains, look for the word "whole" as the first word in the ingredient list. Shopping  Buy products labeled as "low-sodium" or "no salt added."  Buy fresh foods. Avoid canned foods and premade or frozen meals. Cooking  Avoid adding salt when cooking. Use salt-free seasonings or herbs instead of table salt or sea salt. Check with your health care provider or pharmacist before using salt substitutes.  Do not fry foods. Cook foods using healthy methods such as baking, boiling, grilling,   and broiling instead.  Cook with heart-healthy oils, such as olive, canola, soybean, or sunflower oil. Meal planning  Eat a balanced diet that includes: ? 5 or more servings of fruits and vegetables each day. At each meal, try to fill half of your plate with fruits and vegetables. ? Up to 6-8 servings of whole grains each day. ? Less than 6 oz of lean meat, poultry, or fish each day. A 3-oz serving of meat is about the same size as a deck of cards. One egg equals 1 oz. ? 2 servings of low-fat dairy each day. ? A serving of nuts, seeds, or beans 5 times each week. ? Heart-healthy fats. Healthy fats called Omega-3  fatty acids are found in foods such as flaxseeds and coldwater fish, like sardines, salmon, and mackerel.  Limit how much you eat of the following: ? Canned or prepackaged foods. ? Food that is high in trans fat, such as fried foods. ? Food that is high in saturated fat, such as fatty meat. ? Sweets, desserts, sugary drinks, and other foods with added sugar. ? Full-fat dairy products.  Do not salt foods before eating.  Try to eat at least 2 vegetarian meals each week.  Eat more home-cooked food and less restaurant, buffet, and fast food.  When eating at a restaurant, ask that your food be prepared with less salt or no salt, if possible. What foods are recommended? The items listed may not be a complete list. Talk with your dietitian about what dietary choices are best for you. Grains Whole-grain or whole-wheat bread. Whole-grain or whole-wheat pasta. Brown rice. Oatmeal. Quinoa. Bulgur. Whole-grain and low-sodium cereals. Pita bread. Low-fat, low-sodium crackers. Whole-wheat flour tortillas. Vegetables Fresh or frozen vegetables (raw, steamed, roasted, or grilled). Low-sodium or reduced-sodium tomato and vegetable juice. Low-sodium or reduced-sodium tomato sauce and tomato paste. Low-sodium or reduced-sodium canned vegetables. Fruits All fresh, dried, or frozen fruit. Canned fruit in natural juice (without added sugar). Meat and other protein foods Skinless chicken or turkey. Ground chicken or turkey. Pork with fat trimmed off. Fish and seafood. Egg whites. Dried beans, peas, or lentils. Unsalted nuts, nut butters, and seeds. Unsalted canned beans. Lean cuts of beef with fat trimmed off. Low-sodium, lean deli meat. Dairy Low-fat (1%) or fat-free (skim) milk. Fat-free, low-fat, or reduced-fat cheeses. Nonfat, low-sodium ricotta or cottage cheese. Low-fat or nonfat yogurt. Low-fat, low-sodium cheese. Fats and oils Soft margarine without trans fats. Vegetable oil. Low-fat, reduced-fat, or  light mayonnaise and salad dressings (reduced-sodium). Canola, safflower, olive, soybean, and sunflower oils. Avocado. Seasoning and other foods Herbs. Spices. Seasoning mixes without salt. Unsalted popcorn and pretzels. Fat-free sweets. What foods are not recommended? The items listed may not be a complete list. Talk with your dietitian about what dietary choices are best for you. Grains Baked goods made with fat, such as croissants, muffins, or some breads. Dry pasta or rice meal packs. Vegetables Creamed or fried vegetables. Vegetables in a cheese sauce. Regular canned vegetables (not low-sodium or reduced-sodium). Regular canned tomato sauce and paste (not low-sodium or reduced-sodium). Regular tomato and vegetable juice (not low-sodium or reduced-sodium). Pickles. Olives. Fruits Canned fruit in a light or heavy syrup. Fried fruit. Fruit in cream or butter sauce. Meat and other protein foods Fatty cuts of meat. Ribs. Fried meat. Bacon. Sausage. Bologna and other processed lunch meats. Salami. Fatback. Hotdogs. Bratwurst. Salted nuts and seeds. Canned beans with added salt. Canned or smoked fish. Whole eggs or egg yolks. Chicken or turkey   with skin. Dairy Whole or 2% milk, cream, and half-and-half. Whole or full-fat cream cheese. Whole-fat or sweetened yogurt. Full-fat cheese. Nondairy creamers. Whipped toppings. Processed cheese and cheese spreads. Fats and oils Butter. Stick margarine. Lard. Shortening. Ghee. Bacon fat. Tropical oils, such as coconut, palm kernel, or palm oil. Seasoning and other foods Salted popcorn and pretzels. Onion salt, garlic salt, seasoned salt, table salt, and sea salt. Worcestershire sauce. Tartar sauce. Barbecue sauce. Teriyaki sauce. Soy sauce, including reduced-sodium. Steak sauce. Canned and packaged gravies. Fish sauce. Oyster sauce. Cocktail sauce. Horseradish that you find on the shelf. Ketchup. Mustard. Meat flavorings and tenderizers. Bouillon cubes. Hot  sauce and Tabasco sauce. Premade or packaged marinades. Premade or packaged taco seasonings. Relishes. Regular salad dressings. Where to find more information:  National Heart, Lung, and Blood Institute: www.nhlbi.nih.gov  American Heart Association: www.heart.org Summary  The DASH eating plan is a healthy eating plan that has been shown to reduce high blood pressure (hypertension). It may also reduce your risk for type 2 diabetes, heart disease, and stroke.  With the DASH eating plan, you should limit salt (sodium) intake to 2,300 mg a day. If you have hypertension, you may need to reduce your sodium intake to 1,500 mg a day.  When on the DASH eating plan, aim to eat more fresh fruits and vegetables, whole grains, lean proteins, low-fat dairy, and heart-healthy fats.  Work with your health care provider or diet and nutrition specialist (dietitian) to adjust your eating plan to your individual calorie needs. This information is not intended to replace advice given to you by your health care provider. Make sure you discuss any questions you have with your health care provider. Document Revised: 06/23/2017 Document Reviewed: 07/04/2016 Elsevier Patient Education  2020 Elsevier Inc.  

## 2019-12-10 ENCOUNTER — Ambulatory Visit: Payer: Self-pay | Admitting: Gerontology

## 2020-01-07 ENCOUNTER — Telehealth: Payer: Self-pay | Admitting: General Practice

## 2020-01-07 ENCOUNTER — Ambulatory Visit: Payer: Self-pay

## 2020-01-07 NOTE — Telephone Encounter (Signed)
Successfully r/s appt.

## 2020-01-09 ENCOUNTER — Ambulatory Visit: Payer: Self-pay

## 2020-01-21 ENCOUNTER — Ambulatory Visit: Payer: Self-pay | Admitting: Gerontology

## 2020-01-23 ENCOUNTER — Ambulatory Visit: Payer: Self-pay

## 2020-03-18 ENCOUNTER — Telehealth: Payer: Self-pay | Admitting: Pharmacist

## 2020-03-18 NOTE — Telephone Encounter (Signed)
Patient failed to provide requested 2021 financial documentation. No additional medication assistance will be provided by [facility] without the required proof of income documentation. Patient notified by letter Debra Cheek Administrative Assistant [facility] 

## 2020-05-25 ENCOUNTER — Encounter: Payer: Self-pay | Admitting: Family Medicine

## 2020-05-25 ENCOUNTER — Ambulatory Visit: Payer: BLUE CROSS/BLUE SHIELD | Admitting: Family Medicine

## 2020-05-25 ENCOUNTER — Other Ambulatory Visit: Payer: Self-pay

## 2020-05-25 VITALS — BP 196/129 | HR 94 | Temp 98.8°F | Resp 16 | Ht 68.0 in | Wt 231.8 lb

## 2020-05-25 DIAGNOSIS — F1010 Alcohol abuse, uncomplicated: Secondary | ICD-10-CM

## 2020-05-25 DIAGNOSIS — I10 Essential (primary) hypertension: Secondary | ICD-10-CM | POA: Diagnosis not present

## 2020-05-25 DIAGNOSIS — R0789 Other chest pain: Secondary | ICD-10-CM | POA: Diagnosis not present

## 2020-05-25 DIAGNOSIS — Z7689 Persons encountering health services in other specified circumstances: Secondary | ICD-10-CM

## 2020-05-25 LAB — POCT URINALYSIS DIPSTICK
Bilirubin, UA: NEGATIVE
Blood, UA: NEGATIVE
Glucose, UA: NEGATIVE
Ketones, UA: NEGATIVE
Leukocytes, UA: NEGATIVE
Nitrite, UA: NEGATIVE
Protein, UA: POSITIVE — AB
Spec Grav, UA: 1.015 (ref 1.010–1.025)
Urobilinogen, UA: 0.2 E.U./dL
pH, UA: 5 (ref 5.0–8.0)

## 2020-05-25 MED ORDER — CHLORTHALIDONE 25 MG PO TABS: 12.5000 mg | ORAL_TABLET | Freq: Every day | ORAL | 3 refills | Status: DC

## 2020-05-25 MED ORDER — AMLODIPINE BESYLATE 10 MG PO TABS: 10.0000 mg | ORAL_TABLET | Freq: Every day | ORAL | 3 refills | Status: DC

## 2020-05-25 NOTE — Patient Instructions (Signed)
DASH Eating Plan °DASH stands for "Dietary Approaches to Stop Hypertension." The DASH eating plan is a healthy eating plan that has been shown to reduce high blood pressure (hypertension). It may also reduce your risk for type 2 diabetes, heart disease, and stroke. The DASH eating plan may also help with weight loss. °What are tips for following this plan? ° °General guidelines °· Avoid eating more than 2,300 mg (milligrams) of salt (sodium) a day. If you have hypertension, you may need to reduce your sodium intake to 1,500 mg a day. °· Limit alcohol intake to no more than 1 drink a day for nonpregnant women and 2 drinks a day for men. One drink equals 12 oz of beer, 5 oz of wine, or 1½ oz of hard liquor. °· Work with your health care provider to maintain a healthy body weight or to lose weight. Ask what an ideal weight is for you. °· Get at least 30 minutes of exercise that causes your heart to beat faster (aerobic exercise) most days of the week. Activities may include walking, swimming, or biking. °· Work with your health care provider or diet and nutrition specialist (dietitian) to adjust your eating plan to your individual calorie needs. °Reading food labels ° °· Check food labels for the amount of sodium per serving. Choose foods with less than 5 percent of the Daily Value of sodium. Generally, foods with less than 300 mg of sodium per serving fit into this eating plan. °· To find whole grains, look for the word "whole" as the first word in the ingredient list. °Shopping °· Buy products labeled as "low-sodium" or "no salt added." °· Buy fresh foods. Avoid canned foods and premade or frozen meals. °Cooking °· Avoid adding salt when cooking. Use salt-free seasonings or herbs instead of table salt or sea salt. Check with your health care provider or pharmacist before using salt substitutes. °· Do not fry foods. Cook foods using healthy methods such as baking, boiling, grilling, and broiling instead. °· Cook with  heart-healthy oils, such as olive, canola, soybean, or sunflower oil. °Meal planning °· Eat a balanced diet that includes: °? 5 or more servings of fruits and vegetables each day. At each meal, try to fill half of your plate with fruits and vegetables. °? Up to 6-8 servings of whole grains each day. °? Less than 6 oz of lean meat, poultry, or fish each day. A 3-oz serving of meat is about the same size as a deck of cards. One egg equals 1 oz. °? 2 servings of low-fat dairy each day. °? A serving of nuts, seeds, or beans 5 times each week. °? Heart-healthy fats. Healthy fats called Omega-3 fatty acids are found in foods such as flaxseeds and coldwater fish, like sardines, salmon, and mackerel. °· Limit how much you eat of the following: °? Canned or prepackaged foods. °? Food that is high in trans fat, such as fried foods. °? Food that is high in saturated fat, such as fatty meat. °? Sweets, desserts, sugary drinks, and other foods with added sugar. °? Full-fat dairy products. °· Do not salt foods before eating. °· Try to eat at least 2 vegetarian meals each week. °· Eat more home-cooked food and less restaurant, buffet, and fast food. °· When eating at a restaurant, ask that your food be prepared with less salt or no salt, if possible. °What foods are recommended? °The items listed may not be a complete list. Talk with your dietitian about   what dietary choices are best for you. °Grains °Whole-grain or whole-wheat bread. Whole-grain or whole-wheat pasta. Brown rice. Oatmeal. Quinoa. Bulgur. Whole-grain and low-sodium cereals. Pita bread. Low-fat, low-sodium crackers. Whole-wheat flour tortillas. °Vegetables °Fresh or frozen vegetables (raw, steamed, roasted, or grilled). Low-sodium or reduced-sodium tomato and vegetable juice. Low-sodium or reduced-sodium tomato sauce and tomato paste. Low-sodium or reduced-sodium canned vegetables. °Fruits °All fresh, dried, or frozen fruit. Canned fruit in natural juice (without  added sugar). °Meat and other protein foods °Skinless chicken or turkey. Ground chicken or turkey. Pork with fat trimmed off. Fish and seafood. Egg whites. Dried beans, peas, or lentils. Unsalted nuts, nut butters, and seeds. Unsalted canned beans. Lean cuts of beef with fat trimmed off. Low-sodium, lean deli meat. °Dairy °Low-fat (1%) or fat-free (skim) milk. Fat-free, low-fat, or reduced-fat cheeses. Nonfat, low-sodium ricotta or cottage cheese. Low-fat or nonfat yogurt. Low-fat, low-sodium cheese. °Fats and oils °Soft margarine without trans fats. Vegetable oil. Low-fat, reduced-fat, or light mayonnaise and salad dressings (reduced-sodium). Canola, safflower, olive, soybean, and sunflower oils. Avocado. °Seasoning and other foods °Herbs. Spices. Seasoning mixes without salt. Unsalted popcorn and pretzels. Fat-free sweets. °What foods are not recommended? °The items listed may not be a complete list. Talk with your dietitian about what dietary choices are best for you. °Grains °Baked goods made with fat, such as croissants, muffins, or some breads. Dry pasta or rice meal packs. °Vegetables °Creamed or fried vegetables. Vegetables in a cheese sauce. Regular canned vegetables (not low-sodium or reduced-sodium). Regular canned tomato sauce and paste (not low-sodium or reduced-sodium). Regular tomato and vegetable juice (not low-sodium or reduced-sodium). Pickles. Olives. °Fruits °Canned fruit in a light or heavy syrup. Fried fruit. Fruit in cream or butter sauce. °Meat and other protein foods °Fatty cuts of meat. Ribs. Fried meat. Bacon. Sausage. Bologna and other processed lunch meats. Salami. Fatback. Hotdogs. Bratwurst. Salted nuts and seeds. Canned beans with added salt. Canned or smoked fish. Whole eggs or egg yolks. Chicken or turkey with skin. °Dairy °Whole or 2% milk, cream, and half-and-half. Whole or full-fat cream cheese. Whole-fat or sweetened yogurt. Full-fat cheese. Nondairy creamers. Whipped toppings.  Processed cheese and cheese spreads. °Fats and oils °Butter. Stick margarine. Lard. Shortening. Ghee. Bacon fat. Tropical oils, such as coconut, palm kernel, or palm oil. °Seasoning and other foods °Salted popcorn and pretzels. Onion salt, garlic salt, seasoned salt, table salt, and sea salt. Worcestershire sauce. Tartar sauce. Barbecue sauce. Teriyaki sauce. Soy sauce, including reduced-sodium. Steak sauce. Canned and packaged gravies. Fish sauce. Oyster sauce. Cocktail sauce. Horseradish that you find on the shelf. Ketchup. Mustard. Meat flavorings and tenderizers. Bouillon cubes. Hot sauce and Tabasco sauce. Premade or packaged marinades. Premade or packaged taco seasonings. Relishes. Regular salad dressings. °Where to find more information: °· National Heart, Lung, and Blood Institute: www.nhlbi.nih.gov °· American Heart Association: www.heart.org °Summary °· The DASH eating plan is a healthy eating plan that has been shown to reduce high blood pressure (hypertension). It may also reduce your risk for type 2 diabetes, heart disease, and stroke. °· With the DASH eating plan, you should limit salt (sodium) intake to 2,300 mg a day. If you have hypertension, you may need to reduce your sodium intake to 1,500 mg a day. °· When on the DASH eating plan, aim to eat more fresh fruits and vegetables, whole grains, lean proteins, low-fat dairy, and heart-healthy fats. °· Work with your health care provider or diet and nutrition specialist (dietitian) to adjust your eating plan to your   individual calorie needs. This information is not intended to replace advice given to you by your health care provider. Make sure you discuss any questions you have with your health care provider. Document Revised: 06/23/2017 Document Reviewed: 07/04/2016 Elsevier Patient Education  [place]. Alcohol Abuse and Dependence Information, Adult Alcohol is a widely available drug. People drink alcohol in different amounts. People  who drink alcohol very often and in large amounts often have problems during and after drinking. They may develop what is called an alcohol use disorder. There are two main types of alcohol use disorders:  Alcohol abuse. This is when you use alcohol too much or too often. You may use alcohol to make yourself feel happy or to reduce stress. You may have a hard time setting a limit on the amount you drink.  Alcohol dependence. This is when you use alcohol consistently for a period of time, and your body changes as a result. This can make it hard to stop drinking because you may start to feel sick or feel different when you do not use alcohol. These symptoms are known as withdrawal. How can alcohol abuse and dependence affect me? Alcohol abuse and dependence can have a negative effect on your life. Drinking too much can lead to addiction. You may feel like you need alcohol to function normally. You may drink alcohol before work in the morning, during the day, or as soon as you get home from work in the evening. These actions can result in:  Poor work performance.  Job loss.  Financial problems.  Car crashes or criminal charges from driving after drinking alcohol.  Problems in your relationships with friends and family.  Losing the trust and respect of coworkers, friends, and family. Drinking heavily over a long period of time can permanently damage your body and brain, and can cause lifelong health issues, such as:  Damage to your liver or pancreas.  Heart problems, high blood pressure, or stroke.  Certain cancers.  Decreased ability to fight infections.  Brain or nerve damage.  Depression.  Early (premature) death. If you are careless or you crave alcohol, it is easy to drink more than your body can handle (overdose). Alcohol overdose is a serious situation that requires hospitalization. It may lead to permanent injuries or death. What can increase my risk?  Having a family history  of alcohol abuse.  Having depression or other mental health conditions.  Beginning to drink at an early age.  Binge drinking often.  Experiencing trauma, stress, and an unstable home life during childhood.  Spending time with people who drink often. What actions can I take to prevent or manage alcohol abuse and dependence?  Do not drink alcohol if: ? Your health care provider tells you not to drink. ? You are pregnant, may be pregnant, or are planning to become pregnant.  If you drink alcohol: ? Limit how much you use to:  0-1 drink a day for women.  0-2 drinks a day for men. ? Be aware of how much alcohol is in your drink. In the [place] one drink equals one 12 oz bottle of beer (355 mL), one 5 oz glass of wine (148 mL), or one 1 oz glass of hard liquor (44 mL).  Stop drinking if you have been drinking too much. This can be very hard to do if you are used to abusing alcohol. If you begin to have withdrawal symptoms, talk with your health care provider or a person that  you trust. These symptoms may include anxiety, shaky hands, headache, nausea, sweating, or not being able to sleep.  Choose to drink nonalcoholic beverages in social gatherings and places where there may be alcohol. Activity  Spend more time on activities that you enjoy that do not involve alcohol, like hobbies or exercise.  Find healthy ways to cope with stress, such as exercise, meditation, or spending time with people you care about. General information  Talk to your family, coworkers, and friends about supporting you in your efforts to stop drinking. If they drink, ask them not to drink around you. Spend more time with people who do not drink alcohol.  If you think that you have an alcohol dependency problem: ? Tell friends or family about your concerns. ? Talk with your health care provider or another health professional about where to get help. ? Work with a Transport planner and a Publishing rights manager. ? Consider joining a support group for people who struggle with alcohol abuse and dependence. Where to find support   Your health care provider.  SMART Recovery: www.smartrecovery.org Therapy and support groups  Local treatment centers or chemical dependency counselors.  Local AA groups in your community: NicTax.com.pt Where to find more information  Centers for Disease Control and Prevention: http://www.wolf.info/  National Institute on Alcohol Abuse and Alcoholism: http://www.bradshaw.com/  Alcoholics Anonymous (AA): NicTax.com.pt Contact a health care provider if:  You drank more or for longer than you intended on more than one occasion.  You tried to stop drinking or to cut back on how much you drink, but you were not able to.  You often drink to the point of vomiting or passing out.  You want to drink so badly that you cannot think about anything else.  You have problems in your life due to drinking, but you continue to drink.  You keep drinking even though you feel anxious, depressed, or have experienced memory loss.  You have stopped doing the things you used to enjoy in order to drink.  You have to drink more than you used to in order to get the effect you want.  You experience anxiety, sweating, nausea, shakiness, and trouble sleeping when you try to stop drinking. Get help right away if:  You have thoughts about hurting yourself or others.  You have serious withdrawal symptoms, including: ? Confusion. ? Racing heart. ? High blood pressure. ? Fever. If you ever feel like you may hurt yourself or others, or have thoughts about taking your own life, get help right away. You can go to your nearest emergency department or call:  Your local emergency services (911 in the [place]  A suicide crisis helpline, such as the [facility] at 317-845-7599. This is open 24 hours a day. Summary  Alcohol abuse and dependence can have a negative effect on  your life. Drinking too much or too often can lead to addiction.  If you drink alcohol, limit how much you use.  If you are having trouble keeping your drinking under control, find ways to change your behavior. Hobbies, calming activities, exercise, or support groups can help.  If you feel you need help with changing your drinking habits, talk with your health care provider, a good friend, or a therapist, or go to an [place] group. This information is not intended to replace advice given to you by your health care provider. Make sure you discuss any questions you have with your health care provider. Document Revised: 10/30/2018 Document  Reviewed: 09/18/2018 Elsevier Patient Education  [employer].

## 2020-05-25 NOTE — Progress Notes (Addendum)
New patient visit   Patient: Justin Moore   DOB: [birth year]   54 y.o. Male  MRN: [id number] Visit Date: 05/25/2020  Today's healthcare provider: Vernie Murders, PA   No chief complaint on file.  Subjective    Justin Moore is a 54 y.o. male who presents today as a new patient to establish care.  HPI    Past Medical History:  Diagnosis Date  . Hypertension    No past surgical history on file. Family Status  Relation Name Status  . Mother  Alive  . Father  Deceased   Family History  Problem Relation Age of Onset  . Hypertension Mother   . Heart attack Father   . Hypertension Father    Social History   Socioeconomic History  . Marital status: Married    Spouse name: Not on file  . Number of children: Not on file  . Years of education: Not on file  . Highest education level: Not on file  Occupational History  . Not on file  Tobacco Use  . Smoking status: Current Every Day Smoker    Packs/day: 0.50  . Smokeless tobacco: Never Used  Vaping Use  . Vaping Use: Never used  Substance and Sexual Activity  . Alcohol use: Not Currently  . Drug use: Never  . Sexual activity: Not on file  Other Topics Concern  . Not on file  Social History Narrative   ** Merged History Encounter **       Social Determinants of Health   Financial Resource Strain:   . Difficulty of Paying Living Expenses: Not on file  Food Insecurity:   . Worried About Charity fundraiser in the Last Year: Not on file  . Ran Out of Food in the Last Year: Not on file  Transportation Needs:   . Lack of Transportation (Medical): Not on file  . Lack of Transportation (Non-Medical): Not on file  Physical Activity:   . Days of Exercise per Week: Not on file  . Minutes of Exercise per Session: Not on file  Stress:   . Feeling of Stress : Not on file  Social Connections:   . Frequency of Communication with Friends and Family: Not on file  . Frequency of Social Gatherings with Friends and  Family: Not on file  . Attends Religious Services: Not on file  . Active Member of Clubs or Organizations: Not on file  . Attends Archivist Meetings: Not on file  . Marital Status: Not on file   Outpatient Medications Prior to Visit  Medication Sig  . amLODipine (NORVASC) 10 MG tablet Take 1 tablet (10 mg total) by mouth daily.  . Aspirin-Salicylamide-Caffeine (BC HEADACHE POWDER PO) Take 3 packets by mouth daily as needed (pain).  . Blood Pressure Monitor KIT 1 kit by Does not apply route daily.  . chlorthalidone (HYGROTON) 25 MG tablet Take 0.5 tablets (12.5 mg total) by mouth daily.  . magnesium oxide (MAG-OX) 400 MG tablet Take 400 mg by mouth daily.  Marland Kitchen MELATONIN ER PO Take by mouth.   No facility-administered medications prior to visit.   No Known Allergies   There is no immunization history on file for this patient.  Health Maintenance  Topic Date Due  . Hepatitis C Screening  Never done  . COVID-19 Vaccine (1) Never done  . HIV Screening  Never done  . TETANUS/TDAP  Never done  . COLONOSCOPY  Never done  . INFLUENZA  VACCINE  Never done    Patient Care Team: Patient, No Pcp Per as PCP - General (General Practice) Scot Jun, FNP (Family Medicine)  Review of Systems  Constitutional:       General malaise  HENT: Negative.   Eyes: Negative.   Respiratory: Positive for shortness of breath. Negative for cough and wheezing.   Cardiovascular: Positive for chest pain. Negative for palpitations and leg swelling.  Gastrointestinal: Negative.   Genitourinary: Negative.   Musculoskeletal: Negative.   Neurological: Negative.      Objective    BP (!) 196/129   Pulse 94   Temp 98.8 F (37.1 C) (Oral)   Resp 16   Ht 5' 8"  (1.727 m)   Wt 231 lb 12.8 oz (105.1 kg)   SpO2 99%   BMI 35.25 kg/m  Physical Exam Constitutional:      General: He is not in acute distress.    Appearance: He is well-developed.  HENT:     Head: Normocephalic and  atraumatic.     Right Ear: Hearing and tympanic membrane normal.     Left Ear: Hearing and tympanic membrane normal.     Nose: Nose normal.  Eyes:     General: Lids are normal. No scleral icterus.       Right eye: No discharge.        Left eye: No discharge.     Conjunctiva/sclera: Conjunctivae normal.  Neck:     Vascular: No carotid bruit.  Cardiovascular:     Rate and Rhythm: Normal rate and regular rhythm.     Pulses: Normal pulses.     Heart sounds: Normal heart sounds.  Pulmonary:     Effort: Pulmonary effort is normal. No respiratory distress.     Breath sounds: Normal breath sounds.  Abdominal:     General: Bowel sounds are normal.     Palpations: Abdomen is soft.  Musculoskeletal:        General: Normal range of motion.     Cervical back: Normal range of motion and neck supple.  Skin:    Findings: No lesion or rash.  Neurological:     Mental Status: He is alert and oriented to person, place, and time.  Psychiatric:        Speech: Speech normal.        Behavior: Behavior normal.        Thought Content: Thought content normal.    Depression Screen PHQ 2/9 Scores 08/25/2017  PHQ - 2 Score 0   No results found for any visits on 05/25/20.  Assessment & Plan     1. Encounter to establish care Has been off his BP medications and admits to drinking too much ETOH. Wants to get re-established with a PCP and refill medications.   2. Essential hypertension BP very high today. Some chest discomfort and malaise he associates with extra ETOH intake. Will refill the Amlodipine and Chlorthalidone. Check routine labs. EKG did not show any acute changes. Restrict ETOH, stop smoking, restrict salt and fats intake and reduce weight. Recheck in 2 weeks. - CBC with Differential/Platelet - Comprehensive metabolic panel - Lipid panel - TSH - EKG 12-Lead - chlorthalidone (HYGROTON) 25 MG tablet; Take 0.5 tablets (12.5 mg total) by mouth daily.  Dispense: 45 tablet; Refill: 3 -  amLODipine (NORVASC) 10 MG tablet; Take 1 tablet (10 mg total) by mouth daily.  Dispense: 90 tablet; Refill: 3  3. Alcohol abuse Drinking more than half of a 1/5 of  liquor daily. Eating 3 meals a day. Check labs and taper down to 2 ounces of alcohol a day. May need to enroll in AA. Recheck in 2 weeks. - CBC with Differential/Platelet - Comprehensive metabolic panel  4. Chest discomfort Some shortness of breath and sweats with exertion over the past 4-5 months. No acute changes on EKG. Will check routine labs and recheck in 2 weeks. May need cardiology referral pending reports. - CBC with Differential/Platelet - Comprehensive metabolic panel - Lipid panel - EKG 12-Lead   No follow-ups on file.     Andres Shad, PA, have reviewed all documentation for this visit. The documentation on 05/25/20 for the exam, diagnosis, procedures, and orders are all accurate and complete.    Vernie Murders, [place] [phone] (phone) [phone] (fax)  [place]

## 2020-05-26 LAB — CBC WITH DIFFERENTIAL/PLATELET
Basophils Absolute: 0.1 10*3/uL (ref 0.0–0.2)
Basos: 1 %
EOS (ABSOLUTE): 0 10*3/uL (ref 0.0–0.4)
Eos: 0 %
Hematocrit: 45.8 % (ref 37.5–51.0)
Hemoglobin: 16.4 g/dL (ref 13.0–17.7)
Immature Grans (Abs): 0 10*3/uL (ref 0.0–0.1)
Immature Granulocytes: 0 %
Lymphocytes Absolute: 2.5 10*3/uL (ref 0.7–3.1)
Lymphs: 22 %
MCH: 32.6 pg (ref 26.6–33.0)
MCHC: 35.8 g/dL — ABNORMAL HIGH (ref 31.5–35.7)
MCV: 91 fL (ref 79–97)
Monocytes Absolute: 0.8 10*3/uL (ref 0.1–0.9)
Monocytes: 7 %
Neutrophils Absolute: 7.7 10*3/uL — ABNORMAL HIGH (ref 1.4–7.0)
Neutrophils: 70 %
Platelets: 211 10*3/uL (ref 150–450)
RBC: 5.03 x10E6/uL (ref 4.14–5.80)
RDW: 14.3 % (ref 11.6–15.4)
WBC: 11.1 10*3/uL — ABNORMAL HIGH (ref 3.4–10.8)

## 2020-05-26 LAB — LIPID PANEL
Chol/HDL Ratio: 3.7 ratio (ref 0.0–5.0)
Cholesterol, Total: 275 mg/dL — ABNORMAL HIGH (ref 100–199)
HDL: 75 mg/dL (ref >39)
LDL Chol Calc (NIH): 182 mg/dL — ABNORMAL HIGH (ref 0–99)
Triglycerides: 107 mg/dL (ref 0–149)
VLDL Cholesterol Cal: 18 mg/dL (ref 5–40)

## 2020-05-26 LAB — COMPREHENSIVE METABOLIC PANEL
ALT: 32 IU/L (ref 0–44)
AST: 34 IU/L (ref 0–40)
Albumin/Globulin Ratio: 1.5 (ref 1.2–2.2)
Albumin: 4.8 g/dL (ref 3.8–4.9)
Alkaline Phosphatase: 65 IU/L (ref 44–121)
BUN/Creatinine Ratio: 11 (ref 9–20)
BUN: 15 mg/dL (ref 6–24)
Bilirubin Total: 0.6 mg/dL (ref 0.0–1.2)
CO2: 20 mmol/L (ref 20–29)
Calcium: 10.1 mg/dL (ref 8.7–10.2)
Chloride: 100 mmol/L (ref 96–106)
Creatinine, Ser: 1.42 mg/dL — ABNORMAL HIGH (ref 0.76–1.27)
GFR calc Af Amer: 65 mL/min/{1.73_m2} (ref >59)
GFR calc non Af Amer: 56 mL/min/{1.73_m2} — ABNORMAL LOW (ref >59)
Globulin, Total: 3.1 g/dL (ref 1.5–4.5)
Glucose: 103 mg/dL — ABNORMAL HIGH (ref 65–99)
Potassium: 3.6 mmol/L (ref 3.5–5.2)
Sodium: 138 mmol/L (ref 134–144)
Total Protein: 7.9 g/dL (ref 6.0–8.5)

## 2020-05-26 LAB — TSH: TSH: 1.88 u[IU]/mL (ref 0.450–4.500)

## 2020-06-08 ENCOUNTER — Ambulatory Visit (INDEPENDENT_AMBULATORY_CARE_PROVIDER_SITE_OTHER): Payer: BLUE CROSS/BLUE SHIELD | Admitting: Family Medicine

## 2020-06-08 ENCOUNTER — Other Ambulatory Visit: Payer: Self-pay

## 2020-06-08 ENCOUNTER — Encounter: Payer: Self-pay | Admitting: Family Medicine

## 2020-06-08 VITALS — BP 160/101 | HR 92 | Temp 98.4°F | Wt 236.0 lb

## 2020-06-08 DIAGNOSIS — E7800 Pure hypercholesterolemia, unspecified: Secondary | ICD-10-CM | POA: Diagnosis not present

## 2020-06-08 DIAGNOSIS — G8929 Other chronic pain: Secondary | ICD-10-CM

## 2020-06-08 DIAGNOSIS — M25562 Pain in left knee: Secondary | ICD-10-CM | POA: Diagnosis not present

## 2020-06-08 DIAGNOSIS — M25561 Pain in right knee: Secondary | ICD-10-CM

## 2020-06-08 DIAGNOSIS — I10 Essential (primary) hypertension: Secondary | ICD-10-CM

## 2020-06-08 MED ORDER — DICLOFENAC SODIUM 75 MG PO TBEC: 75.0000 mg | DELAYED_RELEASE_TABLET | Freq: Two times a day (BID) | ORAL | 1 refills | Status: DC

## 2020-06-08 MED ORDER — CHLORTHALIDONE 25 MG PO TABS: 25.0000 mg | ORAL_TABLET | Freq: Every day | ORAL | 3 refills | Status: DC

## 2020-06-08 NOTE — Progress Notes (Signed)
Established patient visit   Patient: Justin Moore   DOB: 12-02-1965   54 y.o. Male  MRN: [id number] Visit Date: 06/08/2020  Today's healthcare provider: Vernie Murders, PA   No chief complaint on file.  Subjective    HPI   Hypertension, follow-up  BP Readings from Last 3 Encounters:  06/08/20 (!) 160/101  05/25/20 (!) 196/129  12/05/19 (!) 146/95   Wt Readings from Last 3 Encounters:  06/08/20 236 lb (107 kg)  05/25/20 231 lb 12.8 oz (105.1 kg)  12/05/19 223 lb (101.2 kg)     He was last seen for hypertension 2 weeks ago.  BP at that visit was 196/129. Management since that visit included getting patient back on his medication of Amlodipine and Chlorthalidone.   He reports good compliance with treatment. He is not having side effects.   Use of agents associated with hypertension: none.   Outside blood pressures are being checked and the patient states they are getting better but does not have the readings with him.  He does state that he is feeling better. Symptoms: No chest pain No chest pressure  No palpitations No syncope  No dyspnea No orthopnea  No paroxysmal nocturnal dyspnea No lower extremity edema   Pertinent labs: Lab Results  Component Value Date   CHOL 275 (H) 05/25/2020   HDL 75 05/25/2020   LDLCALC 182 (H) 05/25/2020   TRIG 107 05/25/2020   CHOLHDL 3.7 05/25/2020   Lab Results  Component Value Date   NA 138 05/25/2020   K 3.6 05/25/2020   CREATININE 1.42 (H) 05/25/2020   GFRNONAA 56 (L) 05/25/2020   GFRAA 65 05/25/2020   GLUCOSE 103 (H) 05/25/2020     The ASCVD Risk score (Goff DC Jr., et al., 2013) failed to calculate for the following reasons:   Unable to determine if patient is Non-Hispanic African American   ---------------------------------------------------------------------------------------------------      Medications: Outpatient Medications Prior to Visit  Medication Sig   amLODipine (NORVASC) 10 MG tablet Take 1  tablet (10 mg total) by mouth daily.   Aspirin-Salicylamide-Caffeine (BC HEADACHE POWDER PO) Take 3 packets by mouth daily as needed (pain).   Blood Pressure Monitor KIT 1 kit by Does not apply route daily.   chlorthalidone (HYGROTON) 25 MG tablet Take 0.5 tablets (12.5 mg total) by mouth daily.   MELATONIN ER PO Take by mouth.   [DISCONTINUED] magnesium oxide (MAG-OX) 400 MG tablet Take 400 mg by mouth daily.   No facility-administered medications prior to visit.    Review of Systems  Constitutional: Negative.   HENT: Negative.   Respiratory: Negative.   Cardiovascular: Negative.   Gastrointestinal: Negative.   Musculoskeletal: Positive for arthralgias and back pain.  Neurological: Negative.        Objective    BP (!) 160/101 (BP Location: Right Arm, Patient Position: Sitting, Cuff Size: Normal)    Pulse 92    Temp 98.4 F (36.9 C) (Oral)    Wt 236 lb (107 kg)    SpO2 98%    BMI 35.88 kg/m     Physical Exam Constitutional:      General: He is not in acute distress.    Appearance: He is well-developed.  HENT:     Head: Normocephalic and atraumatic.     Right Ear: Hearing normal.     Left Ear: Hearing normal.     Nose: Nose normal.  Eyes:     General: Lids are normal. No  scleral icterus.       Right eye: No discharge.        Left eye: No discharge.     Conjunctiva/sclera: Conjunctivae normal.  Cardiovascular:     Rate and Rhythm: Normal rate and regular rhythm.  Pulmonary:     Effort: Pulmonary effort is normal. No respiratory distress.     Breath sounds: Normal breath sounds.  Abdominal:     General: Bowel sounds are normal.     Palpations: Abdomen is soft.  Musculoskeletal:        General: Normal range of motion.     Cervical back: Normal range of motion and neck supple.     Comments: Crepitus in both knees behind patellae. Some clicking of the left knee to test ROM. No swelling or locking.  Skin:    Findings: No lesion or rash.  Neurological:     Mental  Status: He is alert and oriented to person, place, and time.  Psychiatric:        Speech: Speech normal.        Behavior: Behavior normal.        Thought Content: Thought content normal.       No results found for any visits on 06/08/20.  Assessment & Plan     1. Essential hypertension Some improvement in BP with Chlorthalidone 25 mg 1/2 tablet qd and Amlodipine 10 mg qd. Increase Chlorthalidone to a whole tablet daily and recheck BP in 3 weeks. - chlorthalidone (HYGROTON) 25 MG tablet; Take 1 tablet (25 mg total) by mouth daily.  Dispense: 90 tablet; Refill: 3  2. Pure hypercholesterolemia Proceed with low fat diet, weight loss and regular exercise as arthritis allows. Lab Results  Component Value Date   CHOL 275 (H) 05/25/2020   HDL 75 05/25/2020   LDLCALC 182 (H) 05/25/2020   TRIG 107 05/25/2020   CHOLHDL 3.7 05/25/2020     3. Chronic pain of both knees Chronic pain in both knees (L>R) for several months. States he has had an injury to the left knee many years ago. May use Tumeric qd and Osteo-Bioflex 2 tablets qd after Diclofenac BID decreases pain and inflammation. Apply moist heat prn. Check x-rays of knees to assess arthritis. - diclofenac (VOLTAREN) 75 MG EC tablet; Take 1 tablet (75 mg total) by mouth 2 (two) times daily.  Dispense: 60 tablet; Refill: 1 - DG Knee Complete 4 Views Left; Future - DG Knee Complete 4 Views Right; Future   No follow-ups on file.      Andres Shad, PA, have reviewed all documentation for this visit. The documentation on 06/08/20 for the exam, diagnosis, procedures, and orders are all accurate and complete.    Vernie Murders, [place] [phone] (phone) [phone] (fax)  [place]

## 2021-01-19 ENCOUNTER — Ambulatory Visit: Payer: Self-pay | Admitting: *Deleted

## 2021-01-19 NOTE — Telephone Encounter (Signed)
Pt called in c/o being short of breath, very tired and feeling like his heart is beating hard for a month now.   He works in [employer] in the heat and "It's been really hot lately".    "I'm drinking a lot of water so I know I'm not dehydrated".   "I just haven't felt good for a month".   "It's my fault I haven't called".    My wife told me to call.  See triage notes.  I scheduled him with Dortha Kern, PA-C for 02/02/2021 at 3:20.    He wants to know if he can be worked in sooner.  I let him know I would send a note with his request.    He can be reached at [phone]. I sent my notes to [facility].

## 2021-01-19 NOTE — Telephone Encounter (Signed)
Reason for Disposition  Oxygen level (e.g., pulse oximetry) 91 to 94 percent    Shortness of breath for a month now.   Feels his BP is elevated and having fatigue  "Just don't feel good for a month now".  Answer Assessment - Initial Assessment Questions 1. DESCRIPTION: "Describe your dizziness."     I'm having shortness of breath.   I work in [employer] and it's hot outside.  I drink a lot of water.     It's been a month that I've not felt well.   I don't take my BP pills like I'm supposed to.   I miss a lot of doses. I just forget to take them sometimes.   I don't use the BP machine.  No dizziness 2. LIGHTHEADED: "Do you feel lightheaded?" (e.g., somewhat faint, woozy, weak upon standing)     No lightheadedness 3. VERTIGO: "Do you feel like either you or the room is spinning or tilting?" (i.e. vertigo)     No 4. SEVERITY: "How bad is it?"  "Do you feel like you are going to faint?" "Can you stand and walk?"   - MILD: Feels slightly dizzy, but walking normally.   - MODERATE: Feels unsteady when walking, but not falling; interferes with normal activities (e.g., school, work).   - SEVERE: Unable to walk without falling, or requires assistance to walk without falling; feels like passing out now.      *No Answer* 5. ONSET:  "When did the dizziness begin?"     *No Answer* 6. AGGRAVATING FACTORS: "Does anything make it worse?" (e.g., standing, change in head position)     *No Answer* 7. HEART RATE: "Can you tell me your heart rate?" "How many beats in 15 seconds?"  (Note: not all patients can do this)       *No Answer* 8. CAUSE: "What do you think is causing the dizziness?"     *No Answer* 9. RECURRENT SYMPTOM: "Have you had dizziness before?" If Yes, ask: "When was the last time?" "What happened that time?"     *No Answer* 10. OTHER SYMPTOMS: "Do you have any other symptoms?" (e.g., fever, chest pain, vomiting, diarrhea, bleeding)       *No Answer* 11. PREGNANCY: "Is there any chance you  are pregnant?" "When was your last menstrual period?"       *No Answer*  Answer Assessment - Initial Assessment Questions 1. RESPIRATORY STATUS: "Describe your breathing?" (e.g., wheezing, shortness of breath, unable to speak, severe coughing)      Pt called in c/o just not feeling good and being short of breath for about a month now.   I work in [employer] out in the heat and it's been really hot lately.   I drink a lot of water so I'm not dehydrated.   He is c/o his BP being elevated.  He has a BP machine but doesn't check it BP.   "I miss taking my BP pills a lot because I have to be at work at 5:00 AM so I forget to take them". 2. ONSET: "When did this breathing problem begin?"      A month ago.    " Know I should have called sooner but I just didn't ".     "My wife made me finally call". 3. PATTERN "Does the difficult breathing come and go, or has it been constant since it started?"      "All the time mostly".     "I can feel my  heart beating fast sometimes".   Denies chest pain. 4. SEVERITY: "How bad is your breathing?" (e.g., mild, moderate, severe)    - MILD: No SOB at rest, mild SOB with walking, speaks normally in sentences, can lie down, no retractions, pulse < 100.    - MODERATE: SOB at rest, SOB with minimal exertion and prefers to sit, cannot lie down flat, speaks in phrases, mild retractions, audible wheezing, pulse 100-120.    - SEVERE: Very SOB at rest, speaks in single words, struggling to breathe, sitting hunched forward, retractions, pulse > 120      Moderate.   Worse when in the heat. 5. RECURRENT SYMPTOM: "Have you had difficulty breathing before?" If Yes, ask: "When was the last time?" and "What happened that time?"      Not asked 6. CARDIAC HISTORY: "Do you have any history of heart disease?" (e.g., heart attack, angina, bypass surgery, angioplasty)      Hypertension 7. LUNG HISTORY: "Do you have any history of lung disease?"  (e.g., pulmonary embolus, asthma,  emphysema)     No 8. CAUSE: "What do you think is causing the breathing problem?"      I don't know.   "I just know I don't feel good any more" 9. OTHER SYMPTOMS: "Do you have any other symptoms? (e.g., dizziness, runny nose, cough, chest pain, fever)     Denies dizziness.   Fatigue 10. O2 SATURATION MONITOR:  "Do you use an oxygen saturation monitor (pulse oximeter) at home?" If Yes, "What is your reading (oxygen level) today?" "What is your usual oxygen saturation reading?" (e.g., 95%)       No 11. PREGNANCY: "Is there any chance you are pregnant?" "When was your last menstrual period?"       N/A 12. TRAVEL: "Have you traveled out of the country in the last month?" (e.g., travel history, exposures)       No  Protocols used: Dizziness - Lightheadedness-A-AH, Breathing Difficulty-A-AH

## 2021-01-19 NOTE — Telephone Encounter (Signed)
Should go to an Urgent Care of the ER if shortness of breath worsening.

## 2021-01-19 NOTE — Telephone Encounter (Signed)
FYI

## 2021-01-21 NOTE — Telephone Encounter (Signed)
Patient had already been given those instruction by triage

## 2021-02-02 ENCOUNTER — Ambulatory Visit: Payer: Self-pay | Admitting: Family Medicine

## 2021-05-20 ENCOUNTER — Other Ambulatory Visit: Payer: Self-pay

## 2021-05-20 ENCOUNTER — Inpatient Hospital Stay: Payer: Self-pay

## 2021-05-20 ENCOUNTER — Inpatient Hospital Stay
Admission: EM | Admit: 2021-05-20 | Discharge: 2021-05-25 | DRG: 896 | Disposition: A | Discharge status: Home / Self Care | Payer: Self-pay | Attending: Internal Medicine | Admitting: Internal Medicine

## 2021-05-20 ENCOUNTER — Emergency Department: Payer: Self-pay

## 2021-05-20 ENCOUNTER — Encounter: Payer: Self-pay | Admitting: Emergency Medicine

## 2021-05-20 DIAGNOSIS — R569 Unspecified convulsions: Secondary | ICD-10-CM

## 2021-05-20 DIAGNOSIS — Z91199 Patient's noncompliance with other medical treatment and regimen due to unspecified reason: Secondary | ICD-10-CM

## 2021-05-20 DIAGNOSIS — I10 Essential (primary) hypertension: Secondary | ICD-10-CM | POA: Diagnosis present

## 2021-05-20 DIAGNOSIS — Z79899 Other long term (current) drug therapy: Secondary | ICD-10-CM

## 2021-05-20 DIAGNOSIS — E872 Acidosis, unspecified: Secondary | ICD-10-CM | POA: Diagnosis present

## 2021-05-20 DIAGNOSIS — G9341 Metabolic encephalopathy: Secondary | ICD-10-CM | POA: Diagnosis present

## 2021-05-20 DIAGNOSIS — F10930 Alcohol use, unspecified with withdrawal, uncomplicated: Secondary | ICD-10-CM

## 2021-05-20 DIAGNOSIS — F1010 Alcohol abuse, uncomplicated: Secondary | ICD-10-CM | POA: Diagnosis present

## 2021-05-20 DIAGNOSIS — F17210 Nicotine dependence, cigarettes, uncomplicated: Secondary | ICD-10-CM | POA: Diagnosis present

## 2021-05-20 DIAGNOSIS — D696 Thrombocytopenia, unspecified: Secondary | ICD-10-CM | POA: Diagnosis present

## 2021-05-20 DIAGNOSIS — G4089 Other seizures: Secondary | ICD-10-CM | POA: Diagnosis present

## 2021-05-20 DIAGNOSIS — I160 Hypertensive urgency: Secondary | ICD-10-CM | POA: Diagnosis present

## 2021-05-20 DIAGNOSIS — I509 Heart failure, unspecified: Secondary | ICD-10-CM

## 2021-05-20 DIAGNOSIS — E876 Hypokalemia: Secondary | ICD-10-CM | POA: Diagnosis present

## 2021-05-20 DIAGNOSIS — F10931 Alcohol use, unspecified with withdrawal delirium: Secondary | ICD-10-CM | POA: Diagnosis present

## 2021-05-20 DIAGNOSIS — Z9114 Patient's other noncompliance with medication regimen: Secondary | ICD-10-CM

## 2021-05-20 DIAGNOSIS — E785 Hyperlipidemia, unspecified: Secondary | ICD-10-CM | POA: Diagnosis present

## 2021-05-20 DIAGNOSIS — Z20822 Contact with and (suspected) exposure to covid-19: Secondary | ICD-10-CM | POA: Diagnosis present

## 2021-05-20 DIAGNOSIS — Z8249 Family history of ischemic heart disease and other diseases of the circulatory system: Secondary | ICD-10-CM

## 2021-05-20 DIAGNOSIS — E8729 Other acidosis: Principal | ICD-10-CM | POA: Diagnosis present

## 2021-05-20 DIAGNOSIS — F10939 Alcohol use, unspecified with withdrawal, unspecified: Secondary | ICD-10-CM

## 2021-05-20 DIAGNOSIS — K7010 Alcoholic hepatitis without ascites: Secondary | ICD-10-CM | POA: Diagnosis present

## 2021-05-20 DIAGNOSIS — F10231 Alcohol dependence with withdrawal delirium: Principal | ICD-10-CM | POA: Diagnosis present

## 2021-05-20 DIAGNOSIS — F1090 Alcohol use, unspecified, uncomplicated: Secondary | ICD-10-CM | POA: Diagnosis present

## 2021-05-20 LAB — ETHANOL: Alcohol, Ethyl (B): <10 mg/dL (ref <10)

## 2021-05-20 LAB — COMPREHENSIVE METABOLIC PANEL
ALT: 148 U/L — ABNORMAL HIGH (ref 0–44)
AST: 198 U/L — ABNORMAL HIGH (ref 15–41)
Albumin: 4.4 g/dL (ref 3.5–5.0)
Alkaline Phosphatase: 75 U/L (ref 38–126)
Anion gap: 24 — ABNORMAL HIGH (ref 5–15)
BUN: 10 mg/dL (ref 6–20)
CO2: 19 mmol/L — ABNORMAL LOW (ref 22–32)
Calcium: 9.5 mg/dL (ref 8.9–10.3)
Chloride: 94 mmol/L — ABNORMAL LOW (ref 98–111)
Creatinine, Ser: 1.33 mg/dL — ABNORMAL HIGH (ref 0.61–1.24)
GFR, Estimated: >60 mL/min (ref >60)
Glucose, Bld: 146 mg/dL — ABNORMAL HIGH (ref 70–99)
Potassium: 2.9 mmol/L — ABNORMAL LOW (ref 3.5–5.1)
Sodium: 137 mmol/L (ref 135–145)
Total Bilirubin: 1.8 mg/dL — ABNORMAL HIGH (ref 0.3–1.2)
Total Protein: 8.6 g/dL — ABNORMAL HIGH (ref 6.5–8.1)

## 2021-05-20 LAB — URINE DRUG SCREEN, QUALITATIVE (ARMC ONLY)
Amphetamines, Ur Screen: NOT DETECTED
Barbiturates, Ur Screen: NOT DETECTED
Benzodiazepine, Ur Scrn: NOT DETECTED
Cannabinoid 50 Ng, Ur ~~LOC~~: NOT DETECTED
Cocaine Metabolite,Ur ~~LOC~~: NOT DETECTED
MDMA (Ecstasy)Ur Screen: NOT DETECTED
Methadone Scn, Ur: NOT DETECTED
Opiate, Ur Screen: NOT DETECTED
Phencyclidine (PCP) Ur S: NOT DETECTED
Tricyclic, Ur Screen: NOT DETECTED

## 2021-05-20 LAB — MAGNESIUM: Magnesium: 1.7 mg/dL (ref 1.7–2.4)

## 2021-05-20 LAB — BASIC METABOLIC PANEL
Anion gap: 14 (ref 5–15)
BUN: 10 mg/dL (ref 6–20)
CO2: 25 mmol/L (ref 22–32)
Calcium: 9.6 mg/dL (ref 8.9–10.3)
Chloride: 96 mmol/L — ABNORMAL LOW (ref 98–111)
Creatinine, Ser: 1.03 mg/dL (ref 0.61–1.24)
GFR, Estimated: >60 mL/min (ref >60)
Glucose, Bld: 105 mg/dL — ABNORMAL HIGH (ref 70–99)
Potassium: 3.4 mmol/L — ABNORMAL LOW (ref 3.5–5.1)
Sodium: 135 mmol/L (ref 135–145)

## 2021-05-20 LAB — LACTIC ACID, PLASMA
Lactic Acid, Venous: 2.9 mmol/L (ref 0.5–1.9)
Lactic Acid, Venous: 3.8 mmol/L (ref 0.5–1.9)

## 2021-05-20 LAB — BETA-HYDROXYBUTYRIC ACID: Beta-Hydroxybutyric Acid: 1.53 mmol/L — ABNORMAL HIGH (ref 0.05–0.27)

## 2021-05-20 LAB — CBC
Hemoglobin: 15.3 g/dL (ref 13.0–17.0)
Platelets: 92 10*3/uL — ABNORMAL LOW (ref 150–400)
WBC: 8.7 10*3/uL (ref 4.0–10.5)
nRBC: 0 % (ref 0.0–0.2)

## 2021-05-20 LAB — RESP PANEL BY RT-PCR (FLU A&B, COVID) ARPGX2
Influenza A by PCR: NEGATIVE
Influenza B by PCR: NEGATIVE
SARS Coronavirus 2 by RT PCR: NEGATIVE

## 2021-05-20 LAB — SALICYLATE LEVEL: Salicylate Lvl: <7 mg/dL — ABNORMAL LOW (ref 7.0–30.0)

## 2021-05-20 LAB — PHOSPHORUS: Phosphorus: 3.4 mg/dL (ref 2.5–4.6)

## 2021-05-20 LAB — CK: Total CK: 641 U/L — ABNORMAL HIGH (ref 49–397)

## 2021-05-20 MED ORDER — SODIUM CHLORIDE 0.9 % IV BOLUS
500.0000 mL | Freq: Once | INTRAVENOUS | Status: AC
  Administered 2021-05-20: 500 mL via INTRAVENOUS

## 2021-05-20 MED ORDER — THIAMINE HCL 100 MG PO TABS: 100.0000 mg | ORAL_TABLET | Freq: Every day | ORAL | Status: DC

## 2021-05-20 MED ORDER — LORAZEPAM 1 MG PO TABS
1.0000 mg | ORAL_TABLET | ORAL | Status: DC | PRN
  Administered 2021-05-21 – 2021-05-24 (×5): 2 mg via ORAL
  Administered 2021-05-24 – 2021-05-25 (×2): 1 mg via ORAL
  Filled 2021-05-20 (×3): qty 2
  Filled 2021-05-20: qty 4
  Filled 2021-05-20 (×2): qty 2
  Filled 2021-05-20: qty 1

## 2021-05-20 MED ORDER — LABETALOL HCL 5 MG/ML IV SOLN
10.0000 mg | INTRAVENOUS | Status: DC | PRN
  Administered 2021-05-20 – 2021-05-23 (×5): 10 mg via INTRAVENOUS
  Filled 2021-05-20 (×5): qty 4

## 2021-05-20 MED ORDER — LORAZEPAM 2 MG/ML IJ SOLN
0.0000 mg | Freq: Four times a day (QID) | INTRAMUSCULAR | Status: AC
  Administered 2021-05-20 – 2021-05-21 (×5): 2 mg via INTRAVENOUS
  Filled 2021-05-20 (×7): qty 1

## 2021-05-20 MED ORDER — ADULT MULTIVITAMIN W/MINERALS CH
1.0000 | ORAL_TABLET | Freq: Every day | ORAL | Status: DC
  Administered 2021-05-20 – 2021-05-25 (×6): 1 via ORAL
  Filled 2021-05-20 (×6): qty 1

## 2021-05-20 MED ORDER — POTASSIUM CHLORIDE CRYS ER 20 MEQ PO TBCR
40.0000 meq | EXTENDED_RELEASE_TABLET | Freq: Once | ORAL | Status: AC
  Administered 2021-05-20: 40 meq via ORAL
  Filled 2021-05-20: qty 2

## 2021-05-20 MED ORDER — HYDRALAZINE HCL 50 MG PO TABS
50.0000 mg | ORAL_TABLET | Freq: Four times a day (QID) | ORAL | Status: DC
  Administered 2021-05-20 – 2021-05-25 (×15): 50 mg via ORAL
  Filled 2021-05-20 (×15): qty 1

## 2021-05-20 MED ORDER — SODIUM CHLORIDE 0.9 % IV BOLUS
2000.0000 mL | Freq: Once | INTRAVENOUS | Status: AC
  Administered 2021-05-20: 2000 mL via INTRAVENOUS

## 2021-05-20 MED ORDER — LORAZEPAM 2 MG/ML IJ SOLN: 0.0000 mg | Freq: Two times a day (BID) | INTRAMUSCULAR | Status: DC

## 2021-05-20 MED ORDER — ADULT MULTIVITAMIN W/MINERALS CH: 1.0000 | ORAL_TABLET | Freq: Every day | ORAL | Status: DC

## 2021-05-20 MED ORDER — LORAZEPAM 2 MG/ML IJ SOLN
1.0000 mg | INTRAMUSCULAR | Status: DC | PRN
  Administered 2021-05-20: 2 mg via INTRAVENOUS
  Administered 2021-05-20: 3 mg via INTRAVENOUS
  Administered 2021-05-20 – 2021-05-21 (×2): 2 mg via INTRAVENOUS
  Administered 2021-05-21: 1 mg via INTRAVENOUS
  Administered 2021-05-21 – 2021-05-22 (×6): 2 mg via INTRAVENOUS
  Administered 2021-05-22: 1 mg via INTRAVENOUS
  Administered 2021-05-22: 3 mg via INTRAVENOUS
  Administered 2021-05-22: 4 mg via INTRAVENOUS
  Administered 2021-05-22 – 2021-05-23 (×2): 1 mg via INTRAVENOUS
  Administered 2021-05-24: 3 mg via INTRAVENOUS
  Filled 2021-05-20 (×9): qty 1
  Filled 2021-05-20: qty 2
  Filled 2021-05-20 (×3): qty 1
  Filled 2021-05-20 (×2): qty 2

## 2021-05-20 MED ORDER — THIAMINE HCL 100 MG PO TABS
100.0000 mg | ORAL_TABLET | Freq: Every day | ORAL | Status: DC
  Administered 2021-05-21 – 2021-05-25 (×5): 100 mg via ORAL
  Filled 2021-05-20 (×5): qty 1

## 2021-05-20 MED ORDER — MAGNESIUM SULFATE 2 GM/50ML IV SOLN
2.0000 g | Freq: Once | INTRAVENOUS | Status: AC
  Administered 2021-05-20: 2 g via INTRAVENOUS
  Filled 2021-05-20: qty 50

## 2021-05-20 MED ORDER — SODIUM CHLORIDE 0.9 % IV BOLUS
1000.0000 mL | Freq: Once | INTRAVENOUS | Status: AC
  Administered 2021-05-20: 1000 mL via INTRAVENOUS

## 2021-05-20 MED ORDER — AMLODIPINE BESYLATE 5 MG PO TABS
10.0000 mg | ORAL_TABLET | Freq: Once | ORAL | Status: AC
  Administered 2021-05-20: 10 mg via ORAL
  Filled 2021-05-20: qty 2

## 2021-05-20 MED ORDER — LORAZEPAM 2 MG PO TABS: 0.0000 mg | ORAL_TABLET | Freq: Two times a day (BID) | ORAL | Status: DC

## 2021-05-20 MED ORDER — FOLIC ACID 1 MG PO TABS
1.0000 mg | ORAL_TABLET | Freq: Every day | ORAL | Status: DC
  Administered 2021-05-20 – 2021-05-25 (×6): 1 mg via ORAL
  Filled 2021-05-20 (×6): qty 1

## 2021-05-20 MED ORDER — THIAMINE HCL 100 MG/ML IJ SOLN
100.0000 mg | Freq: Once | INTRAMUSCULAR | Status: AC
  Administered 2021-05-20: 100 mg via INTRAVENOUS

## 2021-05-20 MED ORDER — LABETALOL HCL 5 MG/ML IV SOLN
5.0000 mg | Freq: Once | INTRAVENOUS | Status: AC
  Administered 2021-05-20: 5 mg via INTRAVENOUS
  Filled 2021-05-20: qty 4

## 2021-05-20 MED ORDER — LISINOPRIL 10 MG PO TABS
10.0000 mg | ORAL_TABLET | Freq: Once | ORAL | Status: AC
  Administered 2021-05-20: 10 mg via ORAL
  Filled 2021-05-20: qty 1

## 2021-05-20 MED ORDER — LORAZEPAM 2 MG PO TABS
0.0000 mg | ORAL_TABLET | Freq: Four times a day (QID) | ORAL | Status: AC
  Administered 2021-05-21: 1 mg via ORAL
  Administered 2021-05-22: 2 mg via ORAL
  Administered 2021-05-22: 1 mg via ORAL
  Filled 2021-05-20 (×4): qty 1

## 2021-05-20 MED ORDER — POTASSIUM CHLORIDE 2 MEQ/ML IV SOLN
INTRAVENOUS | Status: DC
  Filled 2021-05-20 (×9): qty 1000

## 2021-05-20 MED ORDER — THIAMINE HCL 100 MG/ML IJ SOLN
100.0000 mg | Freq: Every day | INTRAMUSCULAR | Status: DC
  Filled 2021-05-20: qty 2

## 2021-05-20 NOTE — ED Notes (Signed)
Dr. Gerri Lins notified pt's BP still high, she states she does not want to bring it down too much, and she is happy it is lower than it was previously. Will continue to monitor.

## 2021-05-20 NOTE — ED Notes (Signed)
Critical Lab result Lactic Acid 2.9 Dr. Gerri Lins notified.

## 2021-05-20 NOTE — ED Notes (Signed)
Pt to ED rm. 7 via recliner, transferred to stretcher. Pt. States he drinks approx. A fifth of etoh daily and quit 2 days pta.

## 2021-05-20 NOTE — H&P (Signed)
Justin Moore is an 55 y.o. male.   Chief Complaint: Seizure-like activity observed by patient's spouse at home. HPI: The patient is a 58 yr old man who admits to drinking 1/5 of hard liquor daily. He has tried to quit in the past and developed alcohol withdrawal at that time. He has a past medical history significant for hypertension and hyperlipidemia, but he states that he has been off of his meds for about 1 month.   He was brought to the ED via EMS after his wife observed him shaking severely at home. The patient states that he was aware of his surroundings throughout this episode. He states that he has been in alcohol withdrawal before, but he does not think that he has ever had a seizure.  He denies fevers, chills, cough, shortness of breath, chest pain, nausea, vomiting, abdominal pain, increasing abdominal girth, peripheral swelling, sores, lesions, rashes, joint swelling, or neurological changes other than possible seizure activity vs tremors from alcohol withdrawal.  In the ED the patient had a CT head which demonstrated no acute intracranial findings. EKG demonstrated sinus tachycardia with a rate of 109. Alcohol level is less than 10. Tox screen was negative. COVID-19 was negative. Chemistry demonstrated a potassium of 2.9, CO2 is low at 19, Creatinine is slightly elevated over baseline at 1.33. Anion gap is elevated at 24. Phos is 3.5 Magnesium is low at 1.7. AST is elevated at 198. ALT is elevated 148, T Bili is elevated at 1.8. Lactic acid is 2.9. Platelets are low at 92. Beta hydroxybutyrate is elevated at 1.53.   He has been placed on a CIWA protocol in the ED. He is receiving IV fluids. He was given amlodipine 10mg  x 1 and labetalol 5 mg IV x 1.  CIWA score is 22.  [facility] were consulted to admit the patient for further evaluation and treatment.  Past Medical History:  Diagnosis Date   Hypertension     History reviewed. No pertinent surgical history.  Family  History  Problem Relation Age of Onset   Hypertension Mother    Heart attack Father    Hypertension Father    Social History:  reports that he has been smoking. He has been smoking an average of .5 packs per day. He uses smokeless tobacco. He reports current alcohol use. He reports that he does not use drugs. (Not in a hospital admission)   Allergies: No Known Allergies  Review of Systems - 12 systems were reviewed with the patient. All were negative except for those elements included in HPI above.    General appearance: alert, cooperative, appears stated age, and mild distress Head: Normocephalic, without obvious abnormality, atraumatic Eyes: conjunctivae/corneas clear. PERRL, EOM's intact. Fundi benign. Throat: lips, mucosa, and tongue normal; teeth and gums normal Neck: no adenopathy, no carotid bruit, no JVD, supple, symmetrical, trachea midline, and thyroid not enlarged, symmetric, no tenderness/mass/nodules Resp:  No increased work of breathing, no wheezes, rales, or rhonchi. No tactile fremitus.  Chest wall: no tenderness Cardio: regular rate and rhythm, S1, S2 normal, no murmur, click, rub or gallop GI: soft, non-tender; bowel sounds normal; no masses,  no organomegaly Extremities: extremities normal, atraumatic, no cyanosis or edema and Homans sign is negative, no sign of DVT Pulses: 2+ and symmetric Skin: Skin color, texture, turgor normal. No rashes or lesions Lymph nodes: Cervical, supraclavicular, and axillary nodes normal. Neurologic: Alert and oriented X 3, normal strength and tone. Normal symmetric reflexes. Normal coordination and gait Incision/Wound:  None   Results for orders placed or performed during the hospital encounter of 05/20/21 (from the past 48 hour(s))  Comprehensive metabolic panel     Status: Abnormal   Collection Time: 05/20/21 12:11 PM  Result Value Ref Range   Sodium 137 135 - 145 mmol/L   Potassium 2.9 (L) 3.5 - 5.1 mmol/L   Chloride 94 (L) 98 -  111 mmol/L   CO2 19 (L) 22 - 32 mmol/L   Glucose, Bld 146 (H) 70 - 99 mg/dL    Comment: Glucose reference range applies only to samples taken after fasting for at least 8 hours.   BUN 10 6 - 20 mg/dL   Creatinine, Ser 3.29 (H) 0.61 - 1.24 mg/dL   Calcium 9.5 8.9 - 92.4 mg/dL   Total Protein 8.6 (H) 6.5 - 8.1 g/dL   Albumin 4.4 3.5 - 5.0 g/dL   AST 268 (H) 15 - 41 U/L   ALT 148 (H) 0 - 44 U/L   Alkaline Phosphatase 75 38 - 126 U/L   Total Bilirubin 1.8 (H) 0.3 - 1.2 mg/dL   GFR, Estimated >34 >19 mL/min    Comment: (NOTE) Calculated using the CKD-EPI Creatinine Equation (2021)    Anion gap 24 (H) 5 - 15    Comment: Performed at [facility], [address] [place], [place] [address]  Ethanol     Status: None   Collection Time: 05/20/21 12:11 PM  Result Value Ref Range   Alcohol, Ethyl (B) <10 <10 mg/dL    Comment: (NOTE) Lowest detectable limit for serum alcohol is 10 mg/dL.  For medical purposes only. Performed at [facility], [address] [place], [place] [address]   cbc     Status: Abnormal   Collection Time: 05/20/21 12:11 PM  Result Value Ref Range   WBC 8.7 4.0 - 10.5 K/uL   RBC RESULTS UNAVAILABLE DUE TO INTERFERING SUBSTANCE 4.22 - 5.81 MIL/uL    Comment: CORRECTED ON 10/27 AT 1255: PREVIOUSLY REPORTED AS 4.27   Hemoglobin 15.3 13.0 - 17.0 g/dL   HCT RESULTS UNAVAILABLE DUE TO INTERFERING SUBSTANCE 39.0 - 52.0 %    Comment: CORRECTED ON 10/27 AT 1255: PREVIOUSLY REPORTED AS 38.9   MCV RESULTS UNAVAILABLE DUE TO INTERFERING SUBSTANCE 80.0 - 100.0 fL    Comment: CORRECTED ON 10/27 AT 1255: PREVIOUSLY REPORTED AS 91.1   MCH RESULTS UNAVAILABLE DUE TO INTERFERING SUBSTANCE 26.0 - 34.0 pg    Comment: CORRECTED ON 10/27 AT 1255: PREVIOUSLY REPORTED AS 35.8   MCHC RESULTS UNAVAILABLE DUE TO INTERFERING SUBSTANCE 30.0 - 36.0 g/dL    Comment: CORRECTED ON 10/27 AT 1255: PREVIOUSLY REPORTED AS 39.3   RDW RESULTS UNAVAILABLE DUE TO INTERFERING  SUBSTANCE 11.5 - 15.5 %    Comment: CORRECTED ON 10/27 AT 1255: PREVIOUSLY REPORTED AS 13.1   Platelets 92 (L) 150 - 400 K/uL    Comment: Immature Platelet Fraction may be clinically indicated, consider ordering this additional test [id number]    nRBC 0.0 0.0 - 0.2 %    Comment: Performed at [facility], [address] [place], [place] [address]  Beta-hydroxybutyric acid     Status: Abnormal   Collection Time: 05/20/21 12:11 PM  Result Value Ref Range   Beta-Hydroxybutyric Acid 1.53 (H) 0.05 - 0.27 mmol/L    Comment: Performed at [facility], [address] [place], [place] [address]  Salicylate level     Status: Abnormal   Collection Time: 05/20/21 12:11 PM  Result Value Ref Range   Salicylate Lvl <7.0 (L) 7.0 - 30.0 mg/dL    Comment: Performed at [facility], [address] [place], [place] [address]  Magnesium     Status: None   Collection Time: 05/20/21 12:11 PM  Result Value Ref Range   Magnesium 1.7 1.7 - 2.4 mg/dL    Comment: Performed at [facility], [address] [place], [place] [address]  Phosphorus     Status: None   Collection Time: 05/20/21 12:11 PM  Result Value Ref Range   Phosphorus 3.4 2.5 - 4.6 mg/dL    Comment: Performed at [facility], [address] [place], [place] [address]  Urine Drug Screen, Qualitative     Status: None   Collection Time: 05/20/21  2:00 PM  Result Value Ref Range   Tricyclic, Ur Screen NONE DETECTED NONE DETECTED   Amphetamines, Ur Screen NONE DETECTED NONE DETECTED   MDMA (Ecstasy)Ur Screen NONE DETECTED NONE DETECTED   Cocaine Metabolite,Ur [place] NONE DETECTED NONE DETECTED   Opiate, Ur Screen NONE DETECTED NONE DETECTED   Phencyclidine (PCP) Ur S NONE DETECTED NONE DETECTED   Cannabinoid 50 Ng, Ur [place] NONE DETECTED NONE DETECTED   Barbiturates, Ur Screen NONE DETECTED NONE DETECTED   Benzodiazepine, Ur Scrn NONE DETECTED NONE DETECTED   Methadone Scn, Ur NONE DETECTED NONE  DETECTED    Comment: (NOTE) Tricyclics + metabolites, urine    Cutoff 1000 ng/mL Amphetamines + metabolites, urine  Cutoff 1000 ng/mL MDMA (Ecstasy), urine              Cutoff 500 ng/mL Cocaine Metabolite, urine          Cutoff 300 ng/mL Opiate + metabolites, urine        Cutoff 300 ng/mL Phencyclidine (PCP), urine         Cutoff 25 ng/mL Cannabinoid, urine                 Cutoff 50 ng/mL Barbiturates + metabolites, urine  Cutoff 200 ng/mL Benzodiazepine, urine              Cutoff 200 ng/mL Methadone, urine                   Cutoff 300 ng/mL  The urine drug screen provides only a preliminary, unconfirmed analytical test result and should not be used for non-medical purposes. Clinical consideration and professional judgment should be applied to any positive drug screen result due to possible interfering substances. A more specific alternate chemical method must be used in order to obtain a confirmed analytical result. Gas chromatography / mass spectrometry (GC/MS) is the preferred confirm atory method. Performed at [facility], [address] [place], [place] [address]   Resp Panel by RT-PCR (Flu A&B, Covid) Nasopharyngeal Swab     Status: None   Collection Time: 05/20/21  2:00 PM   Specimen: Nasopharyngeal Swab; Nasopharyngeal(NP) swabs in vial transport medium  Result Value Ref Range   SARS Coronavirus 2 by RT PCR NEGATIVE NEGATIVE    Comment: (NOTE) SARS-CoV-2 target nucleic acids are NOT DETECTED.  The SARS-CoV-2 RNA is generally detectable in upper respiratory specimens during the acute phase of infection. The lowest concentration of SARS-CoV-2 viral copies this assay can detect is 138 copies/mL. A negative result does not preclude SARS-Cov-2 infection and should not be used as the sole basis for treatment or other patient management decisions. A negative result may occur with  improper specimen collection/handling,  submission of specimen other than  nasopharyngeal swab, presence of viral mutation(s) within the areas targeted by this assay, and inadequate number of viral copies(<138 copies/mL). A negative result must be combined with clinical observations, patient history, and epidemiological information. The expected result is Negative.  Fact Sheet for Patients:  BloggerCourse.com  Fact Sheet for Healthcare Providers:  SeriousBroker.it  This test is no t yet approved or cleared by the [place] FDA and  has been authorized for detection and/or diagnosis of SARS-CoV-2 by FDA under an Emergency Use Authorization (EUA). This EUA will remain  in effect (meaning this test can be used) for the duration of the COVID-19 declaration under Section 564(b)(1) of the Act, 21 U.S.C.section 360bbb-3(b)(1), unless the authorization is terminated  or revoked sooner.       Influenza A by PCR NEGATIVE NEGATIVE   Influenza B by PCR NEGATIVE NEGATIVE    Comment: (NOTE) The Xpert Xpress SARS-CoV-2/FLU/RSV plus assay is intended as an aid in the diagnosis of influenza from Nasopharyngeal swab specimens and should not be used as a sole basis for treatment. Nasal washings and aspirates are unacceptable for Xpert Xpress SARS-CoV-2/FLU/RSV testing.  Fact Sheet for Patients: BloggerCourse.com  Fact Sheet for Healthcare Providers: SeriousBroker.it  This test is not yet approved or cleared by the [place] FDA and has been authorized for detection and/or diagnosis of SARS-CoV-2 by FDA under an Emergency Use Authorization (EUA). This EUA will remain in effect (meaning this test can be used) for the duration of the COVID-19 declaration under Section 564(b)(1) of the Act, 21 U.S.C. section 360bbb-3(b)(1), unless the authorization is terminated or revoked.  Performed at [facility], [address] [place], [place] [address]    Lactic acid, plasma     Status: Abnormal   Collection Time: 05/20/21  2:00 PM  Result Value Ref Range   Lactic Acid, Venous 2.9 (HH) 0.5 - 1.9 mmol/L    Comment: CRITICAL RESULT CALLED TO, READ BACK BY AND VERIFIED WITH EALENA GABORIAULT 05/20/21 1442 MW/JRH Performed at [facility], [address] [place], [place] [address]    @[id number] @  Blood pressure (!) 203/109, pulse (!) 121, temperature 98.4 F (36.9 C), resp. rate 15, height 5\' 8"  (1.727 m), weight 99.8 kg, SpO2 96 %.    Assessment/Plan Alcohol abuse Pt states that he usually drinks about 1/5 of alcohol a day. His last drink was 2 days ago.  Alcohol withdrawal seizure (HCC) Possible seizure activity at home due to alcohol withdrawal, although patient states that he was aware of his surroundings throughout the episode. He states that he has had alcohol withdrawal before, but that to his knowledge he has not had an alcohol withdrawal seizure before. He will be placed on seizure precautions just in case. The patient is very tremulous.  Alcoholic hepatitis without ascites Patient states that he is not aware of the presence of liver disease. LFT's are elevated today. This is possibly due to alcoholic hepatitis. Will check RUQ to evaluate.  Alcoholic ketoacidosis Beta hydroxybutyrate is elevated at 1.53. Patient will receive IV fluids, but must be cautious due to EF of 30-35%. Monitor.   Essential hypertension Blood pressures are particularly high, likely due to alcohol withdrawal. Labetalol 5 mg IV given x 1. Patient is on chlorthalidone, amlodipine, and HCTZ today.   Lactic acidosis Lactic acid is 2.9. Likely due to alcoholic acidosis or possibly a consequence of alcohol withdrawal seizures. CK is pending. IV fluids given cautiously due to EF  of 30-35%.   I have seen and examined this patient myself. I have spent 76 minutes in his evaluation and care.  Daisi Kentner 05/20/2021, 4:28 PM

## 2021-05-20 NOTE — ED Notes (Signed)
Pt. ED stretcher switched out for a hospital bed. Fall alarm in place, turned on. Bed rails up, call light in reach. Pt. Reminded by this RN to stay in bed, and that he does not have to get up to urinate as primo is in place. Pt. States he is seeing rats.

## 2021-05-20 NOTE — ED Notes (Signed)
This RN to bedside for elevated HR on cont. Cardiac monitor. Pt. Making his way to the end of the bed to stand up and urinate, diaphoretic, and arms shaking. Pt. Repositioned in bed, dried off and cleaned up, and discussed Primo external cath application. Pt. Agrees to primo.This RN explains that while he has a Primo external catheter on, he no longer needs to get up to urinate. This RN also encourages pt. To use call light if he needs to get up for any reason as he is unsteady on his feet. Cont. Cardiac monitoring reapplied. Once pt. Is repositioned in bed, and relaxed, he is no longer diaphoretic or shaking. Pt. States he only starts sweating and shaking when he strains.

## 2021-05-20 NOTE — ED Notes (Signed)
Notified Dr. Gerri Lins that pt's repeat LA was 3.9, see MAR for orders.

## 2021-05-20 NOTE — ED Notes (Signed)
This RN to bedside for elevated HR on cont. Cardiac monitor. Pt. Making his way to the end of the bed to stand up and urinate, diaphoretic, and arms shaking. This RN assisted pt. With urinal. After, Pt. Repositioned in bed, dried off and cleaned up, and discussed Primo external cath application. Pt. refuses.This RN explains that while he has a Primo external catheter on, he no longer needs to get up to urinate. This RN also encourages pt. To use call light if he needs to get up for any reason as he is unsteady on his feet. Cont. Cardiac monitoring reapplied. Once pt. Is repositioned in bed, and relaxed, he is no longer diaphoretic or shaking. Pt. States he only starts sweating and shaking when he strains.

## 2021-05-20 NOTE — ED Notes (Signed)
Dr. Gerri Lins notified that pt's BP did not respond to labetalol, see MAR for orders.

## 2021-05-20 NOTE — ED Notes (Signed)
This RN to bedside for elevated HR on cont. Cardiac monitor. Pt. Making his way to the end of the bed to stand up and urinate, diaphoretic, and arms shaking. Pt. Repositioned in bed, dried off and cleaned up, bed linens and gown changed, pt. and Reminded that he has a Primo external catheter on, and no longer needs to get up to urinate. Pt's primo changed out. Cont. Cardiac monitoring reapplied, leads taped. Once pt. Is repositioned in bed, and relaxed, he is no longer diaphoretic or shaking. Pt. States he only starts sweating and shaking when he strains.

## 2021-05-20 NOTE — ED Triage Notes (Signed)
Pt to ED via [facility] from home for alcohol withdrawal. Pt has been daily drinker up until about 2-3 days. Pt normally drinks about a 5th per night. Pt stopped 2-3 days ago cold [place]. Pt has been having tremors and a significant decreased in oral intake. Tremors visible. Pt c/o being very thirsty.   18 G right upper forearm, pt got  300 cc of NS in route to ED.  Tachy 130-140 200/140- hx/o HTN has not had meds

## 2021-05-20 NOTE — ED Notes (Signed)
pt. notified this RN he is seeing con struction crew in his room working on the back of his TV. There is no one but this RN in pt's room. MD notified pt. Is now hallucinating.

## 2021-05-20 NOTE — ED Notes (Signed)
MD notified pt's CIWA is 22, see MAR for orders.

## 2021-05-20 NOTE — Assessment & Plan Note (Signed)
Possible seizure activity at home due to alcohol withdrawal, although patient states that he was aware of his surroundings throughout the episode. He states that he has had alcohol withdrawal before, but that to his knowledge he has not had an alcohol withdrawal seizure before. He will be placed on seizure precautions just in case. The patient is very tremulous.

## 2021-05-20 NOTE — ED Notes (Signed)
Dr. Jannet Mantis at bedside, pt's spouse states pt. Had seizure like activity at home today with entire body shaking, den ies incontinence. Pt's wife states after he stopped he got up and went to the batthroom, denies confusion or weakness.

## 2021-05-20 NOTE — ED Notes (Signed)
This RN to bedside for elevated HR on cardiac monitor. Pt. Is attempting to slide down stretcher exclaiming he need to get to the toilet to urinate. This RN reminds pt. He can just urinate because he has primo in place to suction urine away. Pt. Is redirected to move back up in bed. Cardiac monitor leads reapplied as they had been ripped off when pt. Was attempting to get up. Charge RN, stephen notified of pt's fall risk status, recommends hospital bed with bed alarm and telesitter. Will ask provider for telesitter order.

## 2021-05-20 NOTE — ED Provider Notes (Signed)
[facility] Emergency Department Provider Note   ____________________________________________   Event Date/Time   First MD Initiated Contact with Patient 05/20/21 1233     (approximate)  I have reviewed the triage vital signs and the nursing notes.   HISTORY  Chief Complaint detox    HPI Justin Moore is a 55 y.o. male reports history of high blood pressure not currently on medication for it  He stopped drinking 2 to 3 days ago.  He made decision he no longer wanted to drink any further.  He started developing shakiness tremulousness nausea fatigue decreased appetite over the last 2 days.  Today his wife reports he was shaking so bad to try to get him out of bed and he was weak leading them to seek emergency care due to his severe tremulousness.  Wife unclear not sure if he might of had a seizure but reports that shaking was severe but he did not lose consciousness and did not seem to be disoriented  Patient himself reports similar symptoms with withdrawal.  He is a heavy alcohol user for several years and abruptly decided he needed to stop drinking 2 to 3 days ago.  No fevers or chills or recent illness.  Nausea decreased appetite but no vomiting.  No chest pain or trouble breathing.  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 No pertinent surgical history.  Prior to Admission medications   Medication Sig Start Date End Date Taking? Authorizing Provider   amLODipine (NORVASC) 10 MG tablet Take 1 tablet (10 mg total) by mouth daily. 05/25/20 05/25/21 Yes Chrismon, Vickki Muff, PA-C  chlorthalidone (HYGROTON) 25 MG tablet Take 1 tablet (25 mg total) by mouth daily. 06/08/20  Yes Chrismon, Vickki Muff, PA-C  MELATONIN ER PO Take by mouth.   Yes [provider]  Aspirin-Salicylamide-Caffeine (BC HEADACHE POWDER PO) Take 3 packets by mouth daily as needed (pain). Patient not taking: Reported on 05/20/2021    [provider]  Blood Pressure Monitor KIT 1 kit by Does not apply route daily. 11/28/19   Iloabachie, Chioma E, NP  diclofenac (VOLTAREN) 75 MG EC tablet Take 1 tablet (75 mg total) by mouth 2 (two) times daily. Patient not taking: Reported on 05/20/2021 06/08/20   Chrismon, Vickki Muff, PA-C    Allergies Patient has no known allergies.  Family History  Problem Relation Age of Onset   Hypertension Mother    Heart attack Father    Hypertension Father     Social History Social History   Tobacco Use   Smoking status: Every Day    Packs/day: 0.50    Types: Cigarettes   Smokeless tobacco: Current  Vaping Use   Vaping Use: Never used  Substance Use Topics   Alcohol use: Yes   Drug use: Never    Review of Systems Constitutional: No fever/chills Eyes: No visual changes.  Denies any hallucinations ENT: No sore throat. Cardiovascular: Denies chest pain. Respiratory: Denies shortness of breath.  Occasional cough reports he is a smoker Gastrointestinal: No abdominal pain.  Some nausea decreased appetite Genitourinary: Negative for dysuria. Musculoskeletal: Negative for back pain. Skin: Negative for rash. Neurological: Negative for headaches, areas of focal weakness or numbness.    ____________________________________________   PHYSICAL EXAM:  VITAL SIGNS: ED Triage Vitals  Enc Vitals Group     BP 05/20/21 1205 (!) 224/132     Pulse Rate 05/20/21 1205 (!) 122     Resp 05/20/21 1205 18     Temp 05/20/21 1208 98.4  F (36.9 C)     Temp Source 05/20/21 1205 Oral     SpO2 05/20/21 1205 97 %     Weight 05/20/21 1206 220 lb (99.8 kg)     Height 05/20/21 1206 5' 8"  (1.727 m)     Head Circumference --      Peak Flow --      Pain Score 05/20/21 1203 10     Pain Loc --      Pain Edu? --      Excl. in [place]? --     Constitutional: Alert and oriented.  Mildly ill-appearing, tachycardic very tremulous Eyes: Conjunctivae are normal. Head: Atraumatic. Nose: No congestion/rhinnorhea. Mouth/Throat: Mucous membranes are slightly dry. Neck: No stridor.  Cardiovascular: Tachycardic rate, regular rhythm. Grossly normal heart sounds.  Good peripheral circulation. Respiratory: Normal respiratory effort.  No retractions. Lungs CTAB. Gastrointestinal: Soft and nontender. No distention. Musculoskeletal: No lower extremity tenderness nor edema. Neurologic:  Normal speech and language. No gross focal neurologic deficits are appreciated.  Notable relatively rapid tremor in all extremities.  He is awake fully alert though and does not show evidence of psychomotor agitation delirium or confusion. Skin:  Skin is warm, dry and intact. No rash noted. Psychiatric: Mood and affect are slightly anxious speech and behavior are normal.  ____________________________________________   LABS (all labs ordered are listed, but only abnormal results are displayed)  Labs Reviewed  COMPREHENSIVE METABOLIC PANEL - Abnormal; Notable for the following components:      Result Value   Potassium 2.9 (*)    Chloride 94 (*)    CO2 19 (*)    Glucose, Bld 146 (*)    Creatinine, Ser 1.33 (*)    Total Protein 8.6 (*)    AST 198 (*)    ALT 148 (*)    Total Bilirubin 1.8 (*)    Anion gap 24 (*)    All other components within normal limits  CBC - Abnormal; Notable for the following components:   Platelets 92 (*)    All other components within normal limits  BETA-HYDROXYBUTYRIC ACID - Abnormal; Notable for the following components:    Beta-Hydroxybutyric Acid 1.53 (*)    All other components within normal limits  SALICYLATE LEVEL - Abnormal; Notable for the following components:   Salicylate Lvl <8.0 (*)    All other components within normal limits  LACTIC ACID, PLASMA - Abnormal; Notable for the following components:   Lactic Acid, Venous 2.9 (*)    All other components within normal limits  RESP PANEL BY RT-PCR (FLU A&B, COVID) ARPGX2  RESP PANEL BY RT-PCR (FLU A&B, COVID) ARPGX2  ETHANOL  URINE DRUG SCREEN, QUALITATIVE ([facility] ONLY)  MAGNESIUM  PHOSPHORUS  BASIC METABOLIC PANEL  HIV ANTIBODY (ROUTINE TESTING W REFLEX)  LACTIC ACID, PLASMA  CK   ____________________________________________  EKG  EKG is reviewed interpreted by me Sinus tachycardia rate of 110 Minimal nonspecific T wave abnormality, no evidence of acute  ischemia. ____________________________________________  RADIOLOGY  CT HEAD WO CONTRAST (5MM)  Result Date: 05/20/2021 CLINICAL DATA:  Seizures EXAM: CT HEAD WITHOUT CONTRAST TECHNIQUE: Contiguous axial images were obtained from the base of the skull through the vertex without intravenous contrast. COMPARISON:  None. FINDINGS: Brain: There are no signs of recent bleeding within the cranium. There is no focal mass effect. Ventricles are not dilated. Cortical sulci are prominent. There is decreased density in periventricular white matter. Vascular: Unremarkable. Skull: Unremarkable Sinuses/Orbits: There is mild mucosal thickening in the ethmoid sinus Other: None IMPRESSION: No acute intracranial findings are seen in noncontrast CT brain. Atrophy. Reading location: [place], [place]. Electronically Signed   By: Elmer Picker M.D.   On: 05/20/2021 13:26    CT head reviewed negative for acute finding. ____________________________________________   PROCEDURES  Procedure(s) performed: None  Procedures  Critical Care performed: Yes, see critical care note(s)  CRITICAL CARE Performed by:  Delman Kitten   Total critical care time: 25 minutes  Critical care time was exclusive of separately billable procedures and treating other patients.  Critical care was necessary to treat or prevent imminent or life-threatening deterioration.  Critical care was time spent personally by me on the following activities: development of treatment plan with patient and/or surrogate as well as nursing, discussions with consultants, evaluation of patient's response to treatment, examination of patient, obtaining history from patient or surrogate, ordering and performing treatments and interventions, ordering and review of laboratory studies, ordering and review of radiographic studies, pulse oximetry and re-evaluation of patient's condition.  Patient presents with evidence of significant alcohol withdrawal vital sign hemodynamic instability including fairly severe hypertension and tachycardia severe tremulousness and CIWA score initially greater than 20.  He did respond well to IV Ativan, labs are also notable for a significantly elevated anion gap.  No use of any toxic alcohols and he reports abrupt cessation of alcohol 2 to 3 days ago, which I suspect points in the direction of likely alcoholic ketoacidosis with a mild lactic acid which is suspected secondary to dehydration.  No evidence of this point of acute infectious etiology and he self-reports decision to stop drinking 3 days ago corresponding well with his symptoms.  Possible seizure reported by wife but it is unclear.  Nonetheless responding well to IV benzodiazepine with CIWA score improving patient calm comfortable significant improvement in tremulousness and tachycardia ____________________________________________   INITIAL IMPRESSION / ASSESSMENT AND PLAN / ED COURSE  Pertinent labs & imaging results that were available during my care of the patient were reviewed by me and considered in my medical decision making (see chart for details).    Vitals:   05/20/21 1415 05/20/21 1430  BP:  (!) 203/109  Pulse: (!) 120 (!) 121  Resp: 20 15  Temp:    SpO2: 96% 96%    Discussed with patient we will give Norvasc, previously appears he was prescribed Norvasc in the past.  He is not sure of the name of the blood pressure medicine he typically takes.  He is awake alert conversant able to eat meal after the Ativan and fluids.  Admission discussed with Dr. Benny Lennert.  Clinical Course as of 05/20/21 1623  Thu May 20, 2021  1353 Elevated anion gap noted.  Given the patient's history of abrupt cessation of alcohol, and symptomatology I suspect this may be secondary to alcoholic ketoacidosis.  Will check beta hydroxy, lactic acid CK salicylate level.  Admission discussed with Dr. Benny Lennert and we did discuss the elevated anion  gap as well [MQ]    Clinical Course User Index [MQ] Delman Kitten, MD     ____________________________________________   FINAL CLINICAL IMPRESSION(S) / ED DIAGNOSES  Final diagnoses:  Alcoholic ketoacidosis  Hypokalemia  Acute hyperactive alcohol withdrawal delirium ([place])  Hypertension (likely secondary to withdrawal in the setting of known, untreated hypertension)      Note:  This document was prepared using Dragon voice recognition software and may include unintentional dictation errors       Delman Kitten, MD 05/20/21 1624

## 2021-05-20 NOTE — ED Notes (Signed)
This RN to bedside for elevated HR on cont. Cardiac monitor. Pt. Making his way to the end of the bed to stand up and urinate, diaphoretic, and arms shaking. Pt. Repositioned in bed, dried off and cleaned up, and Reminded that he has a Primo external catheter on, and no longer needs to get up to urinate. Cont. Cardiac monitoring reapplied, leads taped. Once pt. Is repositioned in bed, and relaxed, he is no longer diaphoretic or shaking. Pt. States he only starts sweating and shaking when he strains.

## 2021-05-20 NOTE — Assessment & Plan Note (Signed)
Pt states that he usually drinks about 1/5 of alcohol a day. His last drink was 2 days ago.

## 2021-05-20 NOTE — Assessment & Plan Note (Signed)
Patient states that he is not aware of the presence of liver disease. LFT's are elevated today. This is possibly due to alcoholic hepatitis. Will check RUQ [place] to evaluate.

## 2021-05-20 NOTE — Assessment & Plan Note (Signed)
Lactic acid is 2.9. Likely due to alcoholic acidosis or possibly a consequence of alcohol withdrawal seizures. CK is pending. IV fluids given cautiously due to EF of 30-35%.

## 2021-05-20 NOTE — ED Notes (Signed)
ED Provider at bedside. 

## 2021-05-20 NOTE — ED Provider Notes (Addendum)
Emergency Medicine Provider Triage Evaluation Note  Justin Moore , a 55 y.o. male  was evaluated in triage.  Pt complains of tremors. He had been drinking a 5th of vodka per night until 3 days ago. Feels dehydrated and thirsty. Denies SI or HI.  Review of Systems  Positive: Tremors, vomiting. Negative: Diarrhea  Physical Exam  BP (!) 224/132 (BP Location: Left Arm)   Pulse (!) 122   Resp 18   Ht 5\' 8"  (1.727 m)   Wt 99.8 kg   SpO2 97%   BMI 33.45 kg/m  Gen:   Awake, no distress, tremors visible Resp:  Normal effort  MSK:   Moves extremities without difficulty  Other:    Medical Decision Making  Medically screening exam initiated at 12:08 PM.  Appropriate orders placed.  GAYLEN VENNING was informed that the remainder of the evaluation will be completed by another provider, this initial triage assessment does not replace that evaluation, and the importance of remaining in the ED until their evaluation is complete.   Delton See, FNP 05/20/21 1210    05/22/21 B, FNP 05/20/21 1213    05/22/21, MD 05/20/21 1732

## 2021-05-20 NOTE — Assessment & Plan Note (Signed)
Beta hydroxybutyrate is elevated at 1.53. Patient will receive IV fluids, but must be cautious due to EF of 30-35%. Monitor.

## 2021-05-20 NOTE — Assessment & Plan Note (Addendum)
Blood pressures are particularly high, likely due to alcohol withdrawal. Labetalol 5 mg IV given x 1. Patient is on chlorthalidone, amlodipine, and HCTZ today.

## 2021-05-20 NOTE — BH Assessment (Signed)
Writer seen patient. He stated did not want any help for his substance. Writer provider him with outpatient referral and treatment information for [facility] and contact information for [facility].

## 2021-05-21 LAB — COMPREHENSIVE METABOLIC PANEL
ALT: 109 U/L — ABNORMAL HIGH (ref 0–44)
ALT: 103 U/L — ABNORMAL HIGH (ref 0–44)
AST: 114 U/L — ABNORMAL HIGH (ref 15–41)
AST: 112 U/L — ABNORMAL HIGH (ref 15–41)
Albumin: 4 g/dL (ref 3.5–5.0)
Albumin: 3.7 g/dL (ref 3.5–5.0)
Alkaline Phosphatase: 66 U/L (ref 38–126)
Alkaline Phosphatase: 62 U/L (ref 38–126)
Anion gap: 14 (ref 5–15)
Anion gap: 14 (ref 5–15)
BUN: 10 mg/dL (ref 6–20)
BUN: 9 mg/dL (ref 6–20)
CO2: 23 mmol/L (ref 22–32)
CO2: 21 mmol/L — ABNORMAL LOW (ref 22–32)
Calcium: 9.1 mg/dL (ref 8.9–10.3)
Calcium: 9.1 mg/dL (ref 8.9–10.3)
Chloride: 96 mmol/L — ABNORMAL LOW (ref 98–111)
Chloride: 98 mmol/L (ref 98–111)
Creatinine, Ser: 1.02 mg/dL (ref 0.61–1.24)
Creatinine, Ser: 0.88 mg/dL (ref 0.61–1.24)
GFR, Estimated: >60 mL/min (ref >60)
GFR, Estimated: >60 mL/min (ref >60)
Glucose, Bld: 91 mg/dL (ref 70–99)
Glucose, Bld: 89 mg/dL (ref 70–99)
Potassium: 2.7 mmol/L — CL (ref 3.5–5.1)
Potassium: 4 mmol/L (ref 3.5–5.1)
Sodium: 133 mmol/L — ABNORMAL LOW (ref 135–145)
Sodium: 133 mmol/L — ABNORMAL LOW (ref 135–145)
Total Bilirubin: 2.3 mg/dL — ABNORMAL HIGH (ref 0.3–1.2)
Total Bilirubin: 2.3 mg/dL — ABNORMAL HIGH (ref 0.3–1.2)
Total Protein: 7.4 g/dL (ref 6.5–8.1)
Total Protein: 7.4 g/dL (ref 6.5–8.1)

## 2021-05-21 LAB — PROTIME-INR
INR: 1 (ref 0.8–1.2)
Prothrombin Time: 13.3 seconds (ref 11.4–15.2)

## 2021-05-21 LAB — CBC
Hemoglobin: 14.8 g/dL (ref 13.0–17.0)
Platelets: 83 10*3/uL — ABNORMAL LOW (ref 150–400)
WBC: 8.1 10*3/uL (ref 4.0–10.5)

## 2021-05-21 LAB — GLUCOSE, CAPILLARY: Glucose-Capillary: 101 mg/dL — ABNORMAL HIGH (ref 70–99)

## 2021-05-21 LAB — HIV ANTIBODY (ROUTINE TESTING W REFLEX): HIV Screen 4th Generation wRfx: NONREACTIVE

## 2021-05-21 MED ORDER — CHLORTHALIDONE 25 MG PO TABS
25.0000 mg | ORAL_TABLET | Freq: Every day | ORAL | Status: DC
  Administered 2021-05-21 – 2021-05-25 (×5): 25 mg via ORAL
  Filled 2021-05-21 (×5): qty 1

## 2021-05-21 MED ORDER — AMLODIPINE BESYLATE 10 MG PO TABS
10.0000 mg | ORAL_TABLET | Freq: Every day | ORAL | Status: DC
  Administered 2021-05-21 – 2021-05-25 (×5): 10 mg via ORAL
  Filled 2021-05-21 (×4): qty 1
  Filled 2021-05-21: qty 2

## 2021-05-21 NOTE — ED Notes (Signed)
Called floor rn to turn greenman green. No questions on pt handoff

## 2021-05-21 NOTE — ED Notes (Signed)
Critical K of 2.7 called at this time for this pt, MD [place] notified, this RN redrawing since previous level was 3.4

## 2021-05-21 NOTE — ED Notes (Signed)
IVC by admitting MD

## 2021-05-21 NOTE — ED Notes (Signed)
Gown change

## 2021-05-21 NOTE — ED Notes (Signed)
This RN entered this pt's room upon hearing the bed alarm going off. This pt attempting to get out of bed, shaking a lot. This RN got this pt back into bed and hooked back up to monitoring equipment. Pt Aox4, answers all questions correctly, but states they want to go home and requesting paperwork to leave AMA. This RN explained the dangers of unmonitored alcohol withdrawal to this pt, this pt states "I don't care, bring me the papers, I'm leaving now". This pt currently sweating lightly, noticeably weak and shaking. MD [place] made aware and requested to come speak with this pt.

## 2021-05-21 NOTE — Progress Notes (Signed)
PROGRESS NOTE    Justin Moore  WUX:[id number] DOB: 1966-04-19 DOA: 05/20/2021 PCP: Tamsen Roers, PA-C (Inactive)    Brief Narrative:  Justin Moore is a 55 year old male with past medical history significant for essential hypertension, hyperlipidemia, EtOH use disorder, and medical noncompliance who presented to [facility] ED on 10/27 via EMS with tremors, confusion.  Also with nausea, fatigue, decreased appetite.  Patient normally drinks 1/5 of vodka daily, has not had a drink in 2-3 days.  Denied fever/chills, no recent illnesses.  No sick contacts.  Denies chest pain or shortness of breath.  In the ED, temperature 98.4 F, HR 122, RR 18, BP 224/132, SPO2 97% on room air.  137, potassium 2.9, chloride 94, CO2 19, BUN 10, creatinine 1.33, glucose 146, anion gap 24, phosphorus 3.4, magnesium 1.7, AST 198, ALT 148, total bilirubin 1.8.  Lactic acid 2.9.  WBC 8.7.  Platelets 92.  Salicylate level less than 7.0.  EtOH level less than 10.  Beta hydroxybutyrate acid 1.53.  UDS negative.  Influenza A/B PCR negative.  COVID-19 PCR negative.  CT head without contrast with no acute intracranial findings, atrophy.  Chest x-ray with no acute cardiopulmonary disease process.  Was noted to have a CIWA score initially greater than 20.  Patient was given IV Ativan.  Duration consulted for further evaluation and management for acute EtOH withdrawal.   Assessment & Plan:   Active Problems:   Essential hypertension   Alcohol withdrawal seizure (HCC)   Alcoholic hepatitis without ascites   Alcohol abuse   Lactic acidosis   Alcoholic ketoacidosis   Acute metabolic encephalopathy, POA Patient presenting to the ED via EMS after being found with tremors, intermittent confusion secondary to recent cessation from heavy alcohol use over the last 2-3 days.  Patient is afebrile.  Chest x-ray and CT head without contrast unrevealing.  EtOH level less than 10.  Salicylate level less than 7.0.  Etiology likely  secondary to active withdrawal. --Given Patient's continued confusion, active withdrawal symptoms, and threats to leave AMA, IVC was initiated as patient is danger to himself for discharge at this time given active withdrawal and continued confusion with high CIWA scores --One-to-one safety sitter --Continue treatment as below  Acute EtOH withdrawal Patient presenting to ED via EMS after recent abrupt cessation of heavy alcohol use.  EtOH level less than 10 on admission.  Reportedly drinks fifth of vodka daily.  Was noted to be tremulous, diaphoretic, with tachycardia, hypertension and confusion with CIWA score initially greater than 20 on ED presentation. --CIWAA protocol with symptom triggered Ativan --Thiamine, folic acid, multivitamin --LR w/ 20 mEq KCL at 170mL/h --Environmental education officer --TOC consult for substance abuse counseling  Hypertensive urgency/emergency On ED presentation, BP 224/132; etiology likely combination of medication noncompliance with antihypertensives as well as acute alcohol withdrawal as above. --Amlodipine 10 mg p.o. daily --Chlorthalidone 25 mg p.o. daily --Hydralazine 50 mg p.o. q6h --Labetalol 10mg  IV q2h prn SBP >165 --continue to monitor BP closely and will adjust as needed.  Anion gap metabolic acidosis Alcohol ketoacidosis Lactic acidosis On admission, anion gap 24, lactic acid 2.4, beta hydroxybutyrate acid 1.53, AST 198, ALT 148.  Likely secondary to chronic alcohol abuse. --Continue to trend CMP daily --Repeat lactic acid in a.m. --Supportive care, IV fluid hydration  Alcoholic hepatitis Likely secondary to chronic alcohol abuse. --AST 198>112 --ALT 148>103 -- Avoid hepatotoxins --CMP daily  Thrombocytopenia Platelets 92, likely secondary to chronic alcohol abuse. --SCDs for DVT prophylaxis --CBC daily  Hypokalemia Potassium 2.9 on admission, likely secondary to poor oral intake in the setting of EtOH abuse.  Repleted. --Repeat  electrolytes in the a.m.   DVT prophylaxis: SCDs Start: 05/20/21 1411   Code Status: Full Code Family Communication: No family present at bedside this morning, attempted to contact patient's spouse, no answer on telephone  Disposition Plan:  Level of care: Med-Surg Status is: Inpatient  Remains inpatient appropriate because: Remains in acute alcohol withdrawal, confused, IVC paperwork submitted   Consultants:  None  Procedures:  None  Antimicrobials:  None   Subjective: Patient seen examined at bedside, lying in bed.  Continues with slight diaphoresis, tremors, intermittent confusion, tachycardia and significant hypertension.  Reports that he can "continue going through this process at home".  Was requesting to leave AMA this morning but given his continued acute withdrawals with high CIWA scores he is unfit for discharge at this time and have initiated IVC paperwork.  Attempted to contact patient's family unsuccessful via telephone and no family present at bedside.  Denies chest pain, no shortness of breath, no abdominal pain.  No other acute concerns overnight per nursing staff.  Objective: Vitals:   05/21/21 1202 05/21/21 1315 05/21/21 1317 05/21/21 1341  BP: (!) 181/110  (!) 172/106 (!) 172/106  Pulse: (!) 115 78 (!) 105 (!) 105  Resp:  (!) 25  20  Temp:    98.5 F (36.9 C)  TempSrc:      SpO2:  98%  96%  Weight:      Height:        Intake/Output Summary (Last 24 hours) at 05/21/2021 1354 Last data filed at 05/21/2021 1200 Gross per 24 hour  Intake 1050 ml  Output 1850 ml  Net -800 ml   Filed Weights   05/20/21 1206  Weight: 99.8 kg    Examination:  General exam: Intermittently confused, diaphoretic, tremulous; ill in appearance Respiratory system: Clear to auscultation. Respiratory effort normal.  On room air Cardiovascular system: S1 & S2 heard, tachycardic, regular rhythm. No JVD, murmurs, rubs, gallops or clicks. No pedal edema. Gastrointestinal  system: Abdomen is nondistended, soft and nontender. No organomegaly or masses felt. Normal bowel sounds heard. Central nervous system: Alert, oriented to person/place/time but not situation. No focal neurological deficits. Extremities: Symmetric 5 x 5 power. Skin: No rashes, lesions or ulcers Psychiatry: Judgement and insight appear extremely poor.  Agitated.    Data Reviewed: I have personally reviewed following labs and imaging studies  CBC: Recent Labs  Lab 05/20/21 1211 05/21/21 0707  WBC 8.7 8.1  HGB 15.3 14.8  HCT RESULTS UNAVAILABLE DUE TO INTERFERING SUBSTANCE RESULTS UNAVAILABLE DUE TO INTERFERING SUBSTANCE  MCV RESULTS UNAVAILABLE DUE TO INTERFERING SUBSTANCE RESULTS UNAVAILABLE DUE TO INTERFERING SUBSTANCE  PLT 92* 83*   Basic Metabolic Panel: Recent Labs  Lab 05/20/21 1211 05/20/21 2008 05/21/21 0707 05/21/21 0800  NA 137 135 133* 133*  K 2.9* 3.4* 2.7* 4.0  CL 94* 96* 96* 98  CO2 19* 25 23 21*  GLUCOSE 146* 105* 91 89  BUN 10 10 10 9   CREATININE 1.33* 1.03 1.02 0.88  CALCIUM 9.5 9.6 9.1 9.1  MG 1.7  --   --   --   PHOS 3.4  --   --   --    GFR: Estimated Creatinine Clearance: 111.2 mL/min (by C-G formula based on SCr of 0.88 mg/dL). Liver Function Tests: Recent Labs  Lab 05/20/21 1211 05/21/21 0707 05/21/21 0800  AST 198* 114* 112*  ALT  148* 109* 103*  ALKPHOS 75 66 62  BILITOT 1.8* 2.3* 2.3*  PROT 8.6* 7.4 7.4  ALBUMIN 4.4 4.0 3.7   No results for input(s): LIPASE, AMYLASE in the last 168 hours. No results for input(s): AMMONIA in the last 168 hours. Coagulation Profile: Recent Labs  Lab 05/21/21 0707  INR 1.0   Cardiac Enzymes: Recent Labs  Lab 05/20/21 2008  CKTOTAL 641*   BNP (last 3 results) No results for input(s): PROBNP in the last 8760 hours. HbA1C: No results for input(s): HGBA1C in the last 72 hours. CBG: No results for input(s): GLUCAP in the last 168 hours. Lipid Profile: No results for input(s): CHOL, HDL, LDLCALC,  TRIG, CHOLHDL, LDLDIRECT in the last 72 hours. Thyroid Function Tests: No results for input(s): TSH, T4TOTAL, FREET4, T3FREE, THYROIDAB in the last 72 hours. Anemia Panel: No results for input(s): VITAMINB12, FOLATE, FERRITIN, TIBC, IRON, RETICCTPCT in the last 72 hours. Sepsis Labs: Recent Labs  Lab 05/20/21 1400 05/20/21 1655  LATICACIDVEN 2.9* 3.8*    Recent Results (from the past 240 hour(s))  Resp Panel by RT-PCR (Flu A&B, Covid) Nasopharyngeal Swab     Status: None   Collection Time: 05/20/21  2:00 PM   Specimen: Nasopharyngeal Swab; Nasopharyngeal(NP) swabs in vial transport medium  Result Value Ref Range Status   SARS Coronavirus 2 by RT PCR NEGATIVE NEGATIVE Final    Comment: (NOTE) SARS-CoV-2 target nucleic acids are NOT DETECTED.  The SARS-CoV-2 RNA is generally detectable in upper respiratory specimens during the acute phase of infection. The lowest concentration of SARS-CoV-2 viral copies this assay can detect is 138 copies/mL. A negative result does not preclude SARS-Cov-2 infection and should not be used as the sole basis for treatment or other patient management decisions. A negative result may occur with  improper specimen collection/handling, submission of specimen other than nasopharyngeal swab, presence of viral mutation(s) within the areas targeted by this assay, and inadequate number of viral copies(<138 copies/mL). A negative result must be combined with clinical observations, patient history, and epidemiological information. The expected result is Negative.  Fact Sheet for Patients:  BloggerCourse.com  Fact Sheet for Healthcare Providers:  SeriousBroker.it  This test is no t yet approved or cleared by the [place] FDA and  has been authorized for detection and/or diagnosis of SARS-CoV-2 by FDA under an Emergency Use Authorization (EUA). This EUA will remain  in effect (meaning this test can be  used) for the duration of the COVID-19 declaration under Section 564(b)(1) of the Act, 21 U.S.C.section 360bbb-3(b)(1), unless the authorization is terminated  or revoked sooner.       Influenza A by PCR NEGATIVE NEGATIVE Final   Influenza B by PCR NEGATIVE NEGATIVE Final    Comment: (NOTE) The Xpert Xpress SARS-CoV-2/FLU/RSV plus assay is intended as an aid in the diagnosis of influenza from Nasopharyngeal swab specimens and should not be used as a sole basis for treatment. Nasal washings and aspirates are unacceptable for Xpert Xpress SARS-CoV-2/FLU/RSV testing.  Fact Sheet for Patients: BloggerCourse.com  Fact Sheet for Healthcare Providers: SeriousBroker.it  This test is not yet approved or cleared by the [place] FDA and has been authorized for detection and/or diagnosis of SARS-CoV-2 by FDA under an Emergency Use Authorization (EUA). This EUA will remain in effect (meaning this test can be used) for the duration of the COVID-19 declaration under Section 564(b)(1) of the Act, 21 U.S.C. section 360bbb-3(b)(1), unless the authorization is terminated or revoked.  Performed at [facility], [address] [place], [place] [address]          Radiology Studies: CT HEAD WO CONTRAST ( )  Result Date: 05/20/2021 CLINICAL DATA:  Seizures EXAM: CT HEAD WITHOUT CONTRAST TECHNIQUE: Contiguous axial images were obtained from the base of the skull through the vertex without intravenous contrast. COMPARISON:  None. FINDINGS: Brain: There are no signs of recent bleeding within the cranium. There is no focal mass effect. Ventricles are not dilated. Cortical sulci are prominent. There is decreased density in periventricular white matter. Vascular: Unremarkable. Skull: Unremarkable Sinuses/Orbits: There is mild mucosal thickening in the ethmoid sinus Other: None IMPRESSION: No acute intracranial findings are seen in  noncontrast CT brain. Atrophy. Reading location: [place], [place]. Electronically Signed   By: Ernie Avena M.D.   On: 05/20/2021 13:26   DG Chest Port 1 View  Result Date: 05/20/2021 CLINICAL DATA:  Hypertension, seizure EXAM: PORTABLE CHEST 1 VIEW COMPARISON:  07/26/2017 FINDINGS: The heart size and mediastinal contours are within normal limits. No focal airspace consolidation, pleural effusion, or pneumothorax. The visualized skeletal structures are unremarkable. IMPRESSION: No active disease. Electronically Signed   By: Duanne Guess D.O.   On: 05/20/2021 18:29        Scheduled Meds:  amLODipine  10 mg Oral Daily   chlorthalidone  25 mg Oral Daily   folic acid  1 mg Oral Daily   hydrALAZINE  50 mg Oral Q6H   LORazepam  0-4 mg Intravenous Q6H   Or   LORazepam  0-4 mg Oral Q6H   [START ON 05/22/2021] LORazepam  0-4 mg Intravenous Q12H   Or   [START ON 05/22/2021] LORazepam  0-4 mg Oral Q12H   multivitamin with minerals  1 tablet Oral Daily   thiamine  100 mg Oral Daily   Continuous Infusions:  lactated ringers with kcl 100 mL/hr at 05/21/21 0352     LOS: 1 day    Time spent: 39 minutes spent on chart review, discussion with nursing staff, consultants, updating family and interview/physical exam; more than 50% of that time was spent in counseling and/or coordination of care.    Alvira Philips [place], DO [facility] Available via Epic secure chat 7am-7pm After these hours, please refer to coverage provider listed on [email or web address] 05/21/2021, 1:54 PM

## 2021-05-21 NOTE — ED Notes (Signed)
RN aware bed assigned ?

## 2021-05-21 NOTE — ED Notes (Signed)
Pt refusing medication for CIWA score of 15. Pt Aox4. MD [place] messaged at this time.

## 2021-05-22 DIAGNOSIS — F10931 Alcohol use, unspecified with withdrawal delirium: Secondary | ICD-10-CM | POA: Diagnosis present

## 2021-05-22 LAB — COMPREHENSIVE METABOLIC PANEL
ALT: 99 U/L — ABNORMAL HIGH (ref 0–44)
AST: 111 U/L — ABNORMAL HIGH (ref 15–41)
Albumin: 3.9 g/dL (ref 3.5–5.0)
Alkaline Phosphatase: 70 U/L (ref 38–126)
Anion gap: 15 (ref 5–15)
BUN: 13 mg/dL (ref 6–20)
CO2: 21 mmol/L — ABNORMAL LOW (ref 22–32)
Calcium: 10.2 mg/dL (ref 8.9–10.3)
Chloride: 95 mmol/L — ABNORMAL LOW (ref 98–111)
Creatinine, Ser: 1 mg/dL (ref 0.61–1.24)
GFR, Estimated: >60 mL/min (ref >60)
Glucose, Bld: 105 mg/dL — ABNORMAL HIGH (ref 70–99)
Potassium: 3.2 mmol/L — ABNORMAL LOW (ref 3.5–5.1)
Sodium: 131 mmol/L — ABNORMAL LOW (ref 135–145)
Total Bilirubin: 2.1 mg/dL — ABNORMAL HIGH (ref 0.3–1.2)
Total Protein: 7.6 g/dL (ref 6.5–8.1)

## 2021-05-22 LAB — MAGNESIUM
Magnesium: 1.6 mg/dL — ABNORMAL LOW (ref 1.7–2.4)
Magnesium: 2.5 mg/dL — ABNORMAL HIGH (ref 1.7–2.4)

## 2021-05-22 LAB — CBC
Hemoglobin: 15.3 g/dL (ref 13.0–17.0)
Platelets: 89 10*3/uL — ABNORMAL LOW (ref 150–400)
WBC: 10.2 10*3/uL (ref 4.0–10.5)

## 2021-05-22 LAB — PHOSPHORUS: Phosphorus: 5.1 mg/dL — ABNORMAL HIGH (ref 2.5–4.6)

## 2021-05-22 LAB — GLUCOSE, CAPILLARY
Glucose-Capillary: 125 mg/dL — ABNORMAL HIGH (ref 70–99)
Glucose-Capillary: 100 mg/dL — ABNORMAL HIGH (ref 70–99)

## 2021-05-22 LAB — MRSA NEXT GEN BY PCR, NASAL: MRSA by PCR Next Gen: NOT DETECTED

## 2021-05-22 LAB — POTASSIUM: Potassium: 3.3 mmol/L — ABNORMAL LOW (ref 3.5–5.1)

## 2021-05-22 MED ORDER — POTASSIUM CHLORIDE CRYS ER 20 MEQ PO TBCR
40.0000 meq | EXTENDED_RELEASE_TABLET | ORAL | Status: AC
Start: 2021-05-22 — End: 2021-05-22
  Administered 2021-05-22: 40 meq via ORAL
  Filled 2021-05-22: qty 2

## 2021-05-22 MED ORDER — POTASSIUM CHLORIDE 10 MEQ/100ML IV SOLN
10.0000 meq | INTRAVENOUS | Status: AC
  Administered 2021-05-22 – 2021-05-23 (×4): 10 meq via INTRAVENOUS
  Filled 2021-05-22 (×4): qty 100

## 2021-05-22 MED ORDER — CHLORDIAZEPOXIDE HCL 25 MG PO CAPS: 25.0000 mg | ORAL_CAPSULE | Freq: Two times a day (BID) | ORAL | Status: DC

## 2021-05-22 MED ORDER — CHLORDIAZEPOXIDE HCL 25 MG PO CAPS: 25.0000 mg | ORAL_CAPSULE | Freq: Every day | ORAL | Status: DC

## 2021-05-22 MED ORDER — DEXMEDETOMIDINE HCL IN NACL 400 MCG/100ML IV SOLN
0.4000 ug/kg/h | INTRAVENOUS | Status: DC
  Administered 2021-05-22 – 2021-05-23 (×2): 0.8 ug/kg/h via INTRAVENOUS
  Filled 2021-05-22 (×2): qty 100

## 2021-05-22 MED ORDER — MAGNESIUM SULFATE 4 GM/100ML IV SOLN
4.0000 g | Freq: Once | INTRAVENOUS | Status: AC
  Administered 2021-05-22: 4 g via INTRAVENOUS
  Filled 2021-05-22: qty 100

## 2021-05-22 MED ORDER — LOSARTAN POTASSIUM 50 MG PO TABS
50.0000 mg | ORAL_TABLET | Freq: Every day | ORAL | Status: DC
  Administered 2021-05-22 – 2021-05-23 (×2): 50 mg via ORAL
  Filled 2021-05-22 (×2): qty 1

## 2021-05-22 MED ORDER — LORAZEPAM 2 MG/ML IJ SOLN
1.0000 mg | Freq: Four times a day (QID) | INTRAMUSCULAR | Status: DC
  Administered 2021-05-23: 1 mg via INTRAVENOUS
  Filled 2021-05-22 (×2): qty 1

## 2021-05-22 MED ORDER — CHLORDIAZEPOXIDE HCL 25 MG PO CAPS: 25.0000 mg | ORAL_CAPSULE | Freq: Four times a day (QID) | ORAL | Status: DC

## 2021-05-22 MED ORDER — DEXMEDETOMIDINE HCL IN NACL 200 MCG/50ML IV SOLN
0.4000 ug/kg/h | INTRAVENOUS | Status: DC
  Administered 2021-05-22: 0.4 ug/kg/h via INTRAVENOUS
  Administered 2021-05-22: 0.8 ug/kg/h via INTRAVENOUS
  Filled 2021-05-22: qty 50

## 2021-05-22 MED ORDER — CHLORHEXIDINE GLUCONATE CLOTH 2 % EX PADS
6.0000 | MEDICATED_PAD | Freq: Every day | CUTANEOUS | Status: DC
  Administered 2021-05-22 – 2021-05-25 (×4): 6 via TOPICAL

## 2021-05-22 MED ORDER — CHLORDIAZEPOXIDE HCL 25 MG PO CAPS: 50.0000 mg | ORAL_CAPSULE | Freq: Four times a day (QID) | ORAL | Status: DC

## 2021-05-22 NOTE — Consult Note (Signed)
NAME:  Justin Moore, MRN:  [id number], DOB:  [birth year], LOS: 2 ADMISSION DATE:  05/20/2021, CONSULTATION DATE:  05/22/21  REFERRING MD:  [place] CHIEF COMPLAINT:  Alcohol withdrawal   History of Present Illness:  Justin Moore is a 55 y.o. male with h/o alcohol abuse admitted with suspected alcohol withdrawal on 10/27. He has required numerous frequent doses of lorazepam both oral and intravenous for elevated CIWA score and is refusing oral medications. He was put on IVC as he was threatening to leave AMA and determined to not have capacity to do so. Transfer to Stepdown initiated in order to start Precedex infusion to better control psychomotor agitation related to alcohol withdrawal. PCCM consulted for assistance with management of continuous Precedex.  Pertinent  Medical History  Alcohol abuse Hypertension  Significant Hospital Events: Including procedures, antibiotic start and stop dates in addition to other pertinent events   10/28: requiring frequent benzodiazepine administration for alcohol withdrawal  Interim History / Subjective:  N/A  Objective   Blood pressure (!) 161/107, pulse (!) 103, temperature 98.3 F (36.8 C), temperature source Oral, resp. rate 17, height _0  (1.727 m), weight 99.8 kg, SpO2 97 %.        Intake/Output Summary (Last 24 hours) at 05/22/2021 1215 Last data filed at 05/22/2021 0500 Gross per 24 hour  Intake 1008 ml  Output 450 ml  Net 558 ml   Filed Weights   05/20/21 1206  Weight: 99.8 kg    Examination: General: African American male in NAD, resting HENT: pupils equal and reactive, mosit OMM Lungs: CTA b/l, no W/C/R Cardiovascular: tachycardic, no M/R/G Abdomen: NTND, +BS Extremities: no C/C/E Neuro: resting comfortably in bed, no gross abnormalities GU: deferred  Resolved Hospital Problem list   N/A  Assessment & Plan:  Severe alcohol withdrawal Mild alcoholic hepatitis Thrombocytopenia Electrolyte disturbances  - OK to  start Precedex - Ensure that he continues to receive benzodiazepines as Precedex is an adjunctive agent that treats psychomotor symptoms but does not prevent withdrawal seizures - Continue thiamine, folate, MVI - Replete electrolytes per protocol - Monitor serum electrolytes; repletion per protocol  Best Practice (right click and "Reselect all SmartList Selections" daily)   Diet/type: NPO w/ oral meds DVT prophylaxis: prophylactic heparin  GI prophylaxis: N/A Lines: N/A Foley:  N/A Code Status:  full code Last date of multidisciplinary goals of care discussion [n/a]  I Assessed the need for Labs I Assessed the need for Foley I Assessed the need for Central Venous Line Family Discussion when available I Assessed the need for Mobilization I made an Assessment of medications to be adjusted accordingly Safety Risk assessment completed  Labs   CBC: Recent Labs  Lab 05/20/21 1211 05/21/21 0707 05/22/21 0515  WBC 8.7 8.1 10.2  HGB 15.3 14.8 15.3  HCT RESULTS UNAVAILABLE DUE TO INTERFERING SUBSTANCE RESULTS UNAVAILABLE DUE TO INTERFERING SUBSTANCE RESULTS UNAVAILABLE DUE TO INTERFERING SUBSTANCE  MCV RESULTS UNAVAILABLE DUE TO INTERFERING SUBSTANCE RESULTS UNAVAILABLE DUE TO INTERFERING SUBSTANCE RESULTS UNAVAILABLE DUE TO INTERFERING SUBSTANCE  PLT 92* 83* 89*    Basic Metabolic Panel: Recent Labs  Lab 05/20/21 1211 05/20/21 2008 05/21/21 0707 05/21/21 0800 05/22/21 0515  NA 137 135 133* 133* 131*  K 2.9* 3.4* 2.7* 4.0 3.2*  CL 94* 96* 96* 98 95*  CO2 19* 25 23 21* 21*  GLUCOSE 146* 105* 91 89 105*  BUN _1 CREATININE 1.33* 1.03 1.02 0.88 1.00  CALCIUM 9.5 9.6 9.1 9.1  10.2  MG 1.7  --   --   --  1.6*  PHOS 3.4  --   --   --   --    GFR: Estimated Creatinine Clearance: 97.9 mL/min (by C-G formula based on SCr of 1 mg/dL). Recent Labs  Lab 05/20/21 1211 05/20/21 1400 05/20/21 1655 05/21/21 0707 05/22/21 0515  WBC 8.7  --   --  8.1 10.2   LATICACIDVEN  --  2.9* 3.8*  --   --     Liver Function Tests: Recent Labs  Lab 05/20/21 1211 05/21/21 0707 05/21/21 0800 05/22/21 0515  AST 198* 114* 112* 111*  ALT 148* 109* 103* 99*  ALKPHOS 75 66 62 70  BILITOT 1.8* 2.3* 2.3* 2.1*  PROT 8.6* 7.4 7.4 7.6  ALBUMIN 4.4 4.0 3.7 3.9   No results for input(s): LIPASE, AMYLASE in the last 168 hours. No results for input(s): AMMONIA in the last 168 hours.  ABG    Component Value Date/Time   TCO2 24 07/26/2017 2252     Coagulation Profile: Recent Labs  Lab 05/21/21 0707  INR 1.0    Cardiac Enzymes: Recent Labs  Lab 05/20/21 2008  CKTOTAL 641*    HbA1C: Hemoglobin A1C  Date/Time Value Ref Range Status  08/25/2017 10:11 AM 5.6  Final   Hgb A1c MFr Bld  Date/Time Value Ref Range Status  12/04/2019 12:12 PM 5.6 4.8 - 5.6 % Final    Comment:             Prediabetes: 5.7 - 6.4          Diabetes: >6.4          Glycemic control for adults with diabetes: <7.0     CBG: Recent Labs  Lab 05/21/21 1434 05/22/21 0645  GLUCAP 101* 125*    Review of Systems:   Unable to assess due to patient's clinical status.  Past Medical History:  He,  has a past medical history of Hypertension.   Surgical History:  History reviewed. No pertinent surgical history.   Social History:   reports that he has been smoking. He has been smoking an average of .5 packs per day. He uses smokeless tobacco. He reports current alcohol use. He reports that he does not use drugs.   Family History:  His family history includes Heart attack in his father; Hypertension in his father and mother.   Allergies No Known Allergies   Home Medications  Prior to Admission medications   Medication Sig Start Date End Date Taking? Authorizing Provider  amLODipine (NORVASC) 10 MG tablet Take 1 tablet (10 mg total) by mouth daily. 05/25/20 05/25/21 Yes Chrismon, Vickki Muff, PA-C  chlorthalidone (HYGROTON) 25 MG tablet Take 1 tablet (25 mg total) by  mouth daily. 06/08/20  Yes Chrismon, Vickki Muff, PA-C  MELATONIN ER PO Take by mouth.   Yes [provider]  Aspirin-Salicylamide-Caffeine (BC HEADACHE POWDER PO) Take 3 packets by mouth daily as needed (pain). Patient not taking: Reported on 05/20/2021    [provider]  Blood Pressure Monitor KIT 1 kit by Does not apply route daily. 11/28/19   Iloabachie, Chioma E, NP  diclofenac (VOLTAREN) 75 MG EC tablet Take 1 tablet (75 mg total) by mouth 2 (two) times daily. Patient not taking: Reported on 05/20/2021 06/08/20   Chrismon, Vickki Muff, PA-C     Caitlin Ainley Sharene Butters, MD 05/22/21 2:02 PM

## 2021-05-22 NOTE — Progress Notes (Addendum)
PROGRESS NOTE    Justin Moore  Justin Moore:[id number] DOB: 10-14-1965 DOA: 05/20/2021 PCP: Tamsen Roers, PA-C (Inactive)    Brief Narrative:  Justin Moore is a 55 year old male with past medical history significant for essential hypertension, hyperlipidemia, EtOH use disorder, and medical noncompliance who presented to [facility] ED on 10/27 via EMS with tremors, confusion.  Also with nausea, fatigue, decreased appetite.  Patient normally drinks 1/5 of vodka daily, has not had a drink in 2-3 days.  Denied fever/chills, no recent illnesses.  No sick contacts.  Denies chest pain or shortness of breath.  In the ED, temperature 98.4 F, HR 122, RR 18, BP 224/132, SPO2 97% on room air.  137, potassium 2.9, chloride 94, CO2 19, BUN 10, creatinine 1.33, glucose 146, anion gap 24, phosphorus 3.4, magnesium 1.7, AST 198, ALT 148, total bilirubin 1.8.  Lactic acid 2.9.  WBC 8.7.  Platelets 92.  Salicylate level less than 7.0.  EtOH level less than 10.  Beta hydroxybutyrate acid 1.53.  UDS negative.  Influenza A/B PCR negative.  COVID-19 PCR negative.  CT head without contrast with no acute intracranial findings, atrophy.  Chest x-ray with no acute cardiopulmonary disease process.  Was noted to have a CIWA score initially greater than 20.  Patient was given IV Ativan.  Duration consulted for further evaluation and management for acute EtOH withdrawal.   Assessment & Plan:   Active Problems:   Essential hypertension   Alcohol withdrawal seizure (HCC)   Alcoholic hepatitis without ascites   Alcohol abuse   Lactic acidosis   Alcoholic ketoacidosis   Acute metabolic encephalopathy, POA Patient presenting to the ED via EMS after being found with tremors, intermittent confusion secondary to recent cessation from heavy alcohol use over the last 2-3 days.  Patient is afebrile.  Chest x-ray and CT head without contrast unrevealing.  EtOH level less than 10.  Salicylate level less than 7.0.  Etiology likely  secondary to active withdrawal. --Given Patient's continued confusion, active withdrawal symptoms, and threats to leave AMA, IVC was initiated as patient is danger to himself for discharge at this time given active withdrawal and continued confusion with high CIWA scores --One-to-one safety sitter --Continue treatment as below  Acute EtOH withdrawal Patient presenting to ED via EMS after recent abrupt cessation of heavy alcohol use.  EtOH level less than 10 on admission.  Reportedly drinks fifth of vodka daily.  Was noted to be tremulous, diaphoretic, with tachycardia, hypertension and confusion with CIWA score initially greater than 20 on ED presentation. --CIWAA protocol with symptom triggered Ativan; received 13 mg yesterday and so far 8 mg today --Start Librium taper --Thiamine, folic acid, multivitamin --LR w/ 20 mEq KCL at 147mL/h --Environmental education officer --TOC consult for substance abuse counseling --If requires greater than 20 mg IV Ativan today, will consider transfer to ICU for Precedex infusion  Addendum 12:09 PM: Patient continues to be combative, trying to get out of bed and refusing oral medications despite IV Ativan.  Discussed with critical care, Dr. Arlean Hopping and will transfer to ICU and initiate Precedex drip.  Hypertensive urgency/emergency On ED presentation, BP 224/132; etiology likely combination of medication noncompliance with antihypertensives as well as acute alcohol withdrawal as above. --Amlodipine 10 mg p.o. daily --Chlorthalidone 25 mg p.o. daily --Losartan 50mg  PO daily --Hydralazine 50 mg p.o. q6h --Labetalol 10mg  IV q2h prn SBP >165 --continue to monitor BP closely and will adjust as needed.  Anion gap metabolic acidosis Alcohol ketoacidosis Lactic acidosis  On admission, anion gap 24, lactic acid 2.4, beta hydroxybutyrate acid 1.53, AST 198, ALT 148.  Likely secondary to chronic alcohol abuse. --Continue to trend CMP daily --Repeat lactic acid in  a.m. --Supportive care, IV fluid hydration  Alcoholic hepatitis Likely secondary to chronic alcohol abuse. --AST 198>112>111 --ALT 148>103>99 --Avoid hepatotoxins --CMP daily  Thrombocytopenia Platelets 92, likely secondary to chronic alcohol abuse. --SCDs for DVT prophylaxis --CBC daily  Hypokalemia Hypomagnesemia Potassium 2.9 on admission, likely secondary to poor oral intake in the setting of EtOH abuse.  Repleted. --K 3.2, Mag 1.6 this am; will continue to replete --Repeat electrolytes in the a.m.   DVT prophylaxis: SCDs Start: 05/20/21 1411   Code Status: Full Code Family Communication: No family present at bedside this morning.  Disposition Plan:  Level of care: Med-Surg Status is: Inpatient  Remains inpatient appropriate because: Remains in acute alcohol withdrawal, confused, IVC paperwork submitted   Consultants:  None  Procedures:  None  Antimicrobials:  None   Subjective: Patient seen examined at bedside, lying in bed.  Continues with tremors, intermittent confusion.  Continues with poor oral intake, sitter at bedside states draining some fluids this morning.  Has received 30 mg of Ativan yesterday, 8 mg Ativan so far today.  Nursing concerned about him needing to go to the ICU on Precedex.  Given he still taking oral medications, will start Librium taper on top of Ativan today.  No family present at bedside.  Remains on IVC.  IVC paperwork initiated yesterday.  No other questions or concerns at this time.  Denies headache, no chest pain, no shortness of breath, no abdominal pain.  No acute events overnight per nursing staff.  Objective: Vitals:   05/22/21 0144 05/22/21 0434 05/22/21 0651 05/22/21 0747  BP: (!) 179/102 (!) 171/128 (!) 156/111 (!) 161/107  Pulse: 93 (!) 107  (!) 103  Resp: 19 18  17   Temp: 98.2 F (36.8 C) 98.4 F (36.9 C)  98.3 F (36.8 C)  TempSrc: Oral   Oral  SpO2: 96% 100%  97%  Weight:      Height:        Intake/Output  Summary (Last 24 hours) at 05/22/2021 1115 Last data filed at 05/22/2021 0500 Gross per 24 hour  Intake 1008 ml  Output 1400 ml  Net -392 ml   Filed Weights   05/20/21 1206  Weight: 99.8 kg    Examination:  General exam: Intermittently confused, tremulous; ill in appearance Respiratory system: Clear to auscultation. Respiratory effort normal.  On room air Cardiovascular system: S1 & S2 heard, tachycardic, regular rhythm. No JVD, murmurs, rubs, gallops or clicks. No pedal edema. Gastrointestinal system: Abdomen is nondistended, soft and nontender. No organomegaly or masses felt. Normal bowel sounds heard. Central nervous system: Alert, oriented to person/place/time but not situation. No focal neurological deficits. Extremities: Symmetric 5 x 5 power. Skin: No rashes, lesions or ulcers Psychiatry: Judgement and insight appear extremely poor.     Data Reviewed: I have personally reviewed following labs and imaging studies  CBC: Recent Labs  Lab 05/20/21 1211 05/21/21 0707 05/22/21 0515  WBC 8.7 8.1 10.2  HGB 15.3 14.8 15.3  HCT RESULTS UNAVAILABLE DUE TO INTERFERING SUBSTANCE RESULTS UNAVAILABLE DUE TO INTERFERING SUBSTANCE RESULTS UNAVAILABLE DUE TO INTERFERING SUBSTANCE  MCV RESULTS UNAVAILABLE DUE TO INTERFERING SUBSTANCE RESULTS UNAVAILABLE DUE TO INTERFERING SUBSTANCE RESULTS UNAVAILABLE DUE TO INTERFERING SUBSTANCE  PLT 92* 83* 89*   Basic Metabolic Panel: Recent Labs  Lab 05/20/21 1211 05/20/21 2008  05/21/21 0707 05/21/21 0800 05/22/21 0515  NA 137 135 133* 133* 131*  K 2.9* 3.4* 2.7* 4.0 3.2*  CL 94* 96* 96* 98 95*  CO2 19* 25 23 21* 21*  GLUCOSE 146* 105* 91 89 105*  BUN 10 10 10 9 13   CREATININE 1.33* 1.03 1.02 0.88 1.00  CALCIUM 9.5 9.6 9.1 9.1 10.2  MG 1.7  --   --   --  1.6*  PHOS 3.4  --   --   --   --    GFR: Estimated Creatinine Clearance: 97.9 mL/min (by C-G formula based on SCr of 1 mg/dL). Liver Function Tests: Recent Labs  Lab  05/20/21 1211 05/21/21 0707 05/21/21 0800 05/22/21 0515  AST 198* 114* 112* 111*  ALT 148* 109* 103* 99*  ALKPHOS 75 66 62 70  BILITOT 1.8* 2.3* 2.3* 2.1*  PROT 8.6* 7.4 7.4 7.6  ALBUMIN 4.4 4.0 3.7 3.9   No results for input(s): LIPASE, AMYLASE in the last 168 hours. No results for input(s): AMMONIA in the last 168 hours. Coagulation Profile: Recent Labs  Lab 05/21/21 0707  INR 1.0   Cardiac Enzymes: Recent Labs  Lab 05/20/21 2008  CKTOTAL 641*   BNP (last 3 results) No results for input(s): PROBNP in the last 8760 hours. HbA1C: No results for input(s): HGBA1C in the last 72 hours. CBG: Recent Labs  Lab 05/21/21 1434 05/22/21 0645  GLUCAP 101* 125*   Lipid Profile: No results for input(s): CHOL, HDL, LDLCALC, TRIG, CHOLHDL, LDLDIRECT in the last 72 hours. Thyroid Function Tests: No results for input(s): TSH, T4TOTAL, FREET4, T3FREE, THYROIDAB in the last 72 hours. Anemia Panel: No results for input(s): VITAMINB12, FOLATE, FERRITIN, TIBC, IRON, RETICCTPCT in the last 72 hours. Sepsis Labs: Recent Labs  Lab 05/20/21 1400 05/20/21 1655  LATICACIDVEN 2.9* 3.8*    Recent Results (from the past 240 hour(s))  Resp Panel by RT-PCR (Flu A&B, Covid) Nasopharyngeal Swab     Status: None   Collection Time: 05/20/21  2:00 PM   Specimen: Nasopharyngeal Swab; Nasopharyngeal(NP) swabs in vial transport medium  Result Value Ref Range Status   SARS Coronavirus 2 by RT PCR NEGATIVE NEGATIVE Final    Comment: (NOTE) SARS-CoV-2 target nucleic acids are NOT DETECTED.  The SARS-CoV-2 RNA is generally detectable in upper respiratory specimens during the acute phase of infection. The lowest concentration of SARS-CoV-2 viral copies this assay can detect is 138 copies/mL. A negative result does not preclude SARS-Cov-2 infection and should not be used as the sole basis for treatment or other patient management decisions. A negative result may occur with  improper specimen  collection/handling, submission of specimen other than nasopharyngeal swab, presence of viral mutation(s) within the areas targeted by this assay, and inadequate number of viral copies(<138 copies/mL). A negative result must be combined with clinical observations, patient history, and epidemiological information. The expected result is Negative.  Fact Sheet for Patients:  BloggerCourse.com  Fact Sheet for Healthcare Providers:  SeriousBroker.it  This test is no t yet approved or cleared by the [place] FDA and  has been authorized for detection and/or diagnosis of SARS-CoV-2 by FDA under an Emergency Use Authorization (EUA). This EUA will remain  in effect (meaning this test can be used) for the duration of the COVID-19 declaration under Section 564(b)(1) of the Act, 21 U.S.C.section 360bbb-3(b)(1), unless the authorization is terminated  or revoked sooner.       Influenza A by PCR NEGATIVE NEGATIVE Final   Influenza  B by PCR NEGATIVE NEGATIVE Final    Comment: (NOTE) The Xpert Xpress SARS-CoV-2/FLU/RSV plus assay is intended as an aid in the diagnosis of influenza from Nasopharyngeal swab specimens and should not be used as a sole basis for treatment. Nasal washings and aspirates are unacceptable for Xpert Xpress SARS-CoV-2/FLU/RSV testing.  Fact Sheet for Patients: BloggerCourse.com  Fact Sheet for Healthcare Providers: SeriousBroker.it  This test is not yet approved or cleared by the [place] FDA and has been authorized for detection and/or diagnosis of SARS-CoV-2 by FDA under an Emergency Use Authorization (EUA). This EUA will remain in effect (meaning this test can be used) for the duration of the COVID-19 declaration under Section 564(b)(1) of the Act, 21 U.S.C. section 360bbb-3(b)(1), unless the authorization is terminated or revoked.  Performed at [facility], [address] [place], [place] [address]          Radiology Studies: CT HEAD WO CONTRAST ( )  Result Date: 05/20/2021 CLINICAL DATA:  Seizures EXAM: CT HEAD WITHOUT CONTRAST TECHNIQUE: Contiguous axial images were obtained from the base of the skull through the vertex without intravenous contrast. COMPARISON:  None. FINDINGS: Brain: There are no signs of recent bleeding within the cranium. There is no focal mass effect. Ventricles are not dilated. Cortical sulci are prominent. There is decreased density in periventricular white matter. Vascular: Unremarkable. Skull: Unremarkable Sinuses/Orbits: There is mild mucosal thickening in the ethmoid sinus Other: None IMPRESSION: No acute intracranial findings are seen in noncontrast CT brain. Atrophy. Reading location: [place], [place]. Electronically Signed   By: Ernie Avena M.D.   On: 05/20/2021 13:26   DG Chest Port 1 View  Result Date: 05/20/2021 CLINICAL DATA:  Hypertension, seizure EXAM: PORTABLE CHEST 1 VIEW COMPARISON:  07/26/2017 FINDINGS: The heart size and mediastinal contours are within normal limits. No focal airspace consolidation, pleural effusion, or pneumothorax. The visualized skeletal structures are unremarkable. IMPRESSION: No active disease. Electronically Signed   By: Duanne Guess D.O.   On: 05/20/2021 18:29        Scheduled Meds:  amLODipine  10 mg Oral Daily   chlordiazePOXIDE  50 mg Oral QID   Followed by   Melene Muller ON 05/23/2021] chlordiazePOXIDE  25 mg Oral QID   Followed by   Melene Muller ON 05/24/2021] chlordiazePOXIDE  25 mg Oral BID   Followed by   Melene Muller ON 05/25/2021] chlordiazePOXIDE  25 mg Oral QHS   chlorthalidone  25 mg Oral Daily   folic acid  1 mg Oral Daily   hydrALAZINE  50 mg Oral Q6H   LORazepam  0-4 mg Intravenous Q12H   Or   LORazepam  0-4 mg Oral Q12H   losartan  50 mg Oral Daily   multivitamin with minerals  1 tablet Oral Daily   potassium chloride  40 mEq Oral  Q4H   thiamine  100 mg Oral Daily   Continuous Infusions:  lactated ringers with kcl 100 mL/hr at 05/22/21 0206     LOS: 2 days    Time spent: 38 minutes spent on chart review, discussion with nursing staff, consultants, updating family and interview/physical exam; more than 50% of that time was spent in counseling and/or coordination of care.    Justin Philips [place], DO [facility] Available via Epic secure chat 7am-7pm After these hours, please refer to coverage provider listed on [email or web address] 05/22/2021, 11:15 AM

## 2021-05-22 NOTE — Progress Notes (Signed)
1630 Received from 1A via bed. Combative and attempting to get OOB. Confused to everything but self and spouse.

## 2021-05-23 LAB — CBC
HCT: 35.8 % — ABNORMAL LOW (ref 39.0–52.0)
Hemoglobin: 13.6 g/dL (ref 13.0–17.0)
MCH: 34.1 pg — ABNORMAL HIGH (ref 26.0–34.0)
MCHC: 38 g/dL — ABNORMAL HIGH (ref 30.0–36.0)
MCV: 89.7 fL (ref 80.0–100.0)
Platelets: 88 10*3/uL — ABNORMAL LOW (ref 150–400)
RBC: 3.99 MIL/uL — ABNORMAL LOW (ref 4.22–5.81)
RDW: 12.7 % (ref 11.5–15.5)
WBC: 8.1 10*3/uL (ref 4.0–10.5)
nRBC: 0 % (ref 0.0–0.2)

## 2021-05-23 LAB — GLUCOSE, CAPILLARY
Glucose-Capillary: 110 mg/dL — ABNORMAL HIGH (ref 70–99)
Glucose-Capillary: 96 mg/dL (ref 70–99)

## 2021-05-23 LAB — COMPREHENSIVE METABOLIC PANEL
ALT: 84 U/L — ABNORMAL HIGH (ref 0–44)
AST: 80 U/L — ABNORMAL HIGH (ref 15–41)
Albumin: 3.3 g/dL — ABNORMAL LOW (ref 3.5–5.0)
Alkaline Phosphatase: 58 U/L (ref 38–126)
Anion gap: 12 (ref 5–15)
BUN: 19 mg/dL (ref 6–20)
CO2: 22 mmol/L (ref 22–32)
Calcium: 9.5 mg/dL (ref 8.9–10.3)
Chloride: 100 mmol/L (ref 98–111)
Creatinine, Ser: 1.05 mg/dL (ref 0.61–1.24)
GFR, Estimated: >60 mL/min (ref >60)
Glucose, Bld: 109 mg/dL — ABNORMAL HIGH (ref 70–99)
Potassium: 3.6 mmol/L (ref 3.5–5.1)
Sodium: 134 mmol/L — ABNORMAL LOW (ref 135–145)
Total Bilirubin: 1.8 mg/dL — ABNORMAL HIGH (ref 0.3–1.2)
Total Protein: 6.9 g/dL (ref 6.5–8.1)

## 2021-05-23 LAB — PHOSPHORUS: Phosphorus: 4.3 mg/dL (ref 2.5–4.6)

## 2021-05-23 LAB — MAGNESIUM: Magnesium: 2 mg/dL (ref 1.7–2.4)

## 2021-05-23 LAB — LACTIC ACID, PLASMA: Lactic Acid, Venous: 0.9 mmol/L (ref 0.5–1.9)

## 2021-05-23 MED ORDER — LORAZEPAM 1 MG PO TABS
1.0000 mg | ORAL_TABLET | Freq: Three times a day (TID) | ORAL | Status: AC
  Administered 2021-05-23 (×3): 1 mg via ORAL
  Filled 2021-05-23 (×3): qty 1

## 2021-05-23 MED ORDER — CLONIDINE HCL 0.1 MG PO TABS
0.1000 mg | ORAL_TABLET | Freq: Three times a day (TID) | ORAL | Status: AC
  Administered 2021-05-24 (×3): 0.1 mg via ORAL
  Filled 2021-05-23 (×3): qty 1

## 2021-05-23 MED ORDER — CLONIDINE HCL 0.1 MG PO TABS
0.1000 mg | ORAL_TABLET | Freq: Two times a day (BID) | ORAL | Status: DC
  Administered 2021-05-25: 10:00:00 0.1 mg via ORAL
  Filled 2021-05-23: qty 1

## 2021-05-23 MED ORDER — LORAZEPAM 1 MG PO TABS
1.0000 mg | ORAL_TABLET | Freq: Two times a day (BID) | ORAL | Status: AC
  Administered 2021-05-24 (×2): 1 mg via ORAL
  Filled 2021-05-23 (×2): qty 1

## 2021-05-23 MED ORDER — CLONIDINE HCL 0.1 MG PO TABS
0.2000 mg | ORAL_TABLET | Freq: Three times a day (TID) | ORAL | Status: AC
  Administered 2021-05-23 (×3): 0.2 mg via ORAL
  Filled 2021-05-23 (×3): qty 2

## 2021-05-23 MED ORDER — LORAZEPAM 0.5 MG PO TABS
0.5000 mg | ORAL_TABLET | Freq: Two times a day (BID) | ORAL | Status: DC
  Administered 2021-05-25: 10:00:00 0.5 mg via ORAL
  Filled 2021-05-23: qty 1

## 2021-05-23 MED ORDER — MELATONIN 5 MG PO TABS
5.0000 mg | ORAL_TABLET | Freq: Every day | ORAL | Status: DC
  Administered 2021-05-23 – 2021-05-24 (×2): 5 mg via ORAL
  Filled 2021-05-23 (×3): qty 1

## 2021-05-23 NOTE — Progress Notes (Signed)
NAME:  Justin Moore, MRN:  [id number], DOB:  1966-02-29, LOS: 3 ADMISSION DATE:  05/20/2021, CONSULTATION DATE:  05/23/21  REFERRING MD:  [place] CHIEF COMPLAINT:  Alcohol withdrawal   History of Present Illness:  Justin Moore is a 55 y.o. male with h/o alcohol abuse admitted with suspected alcohol withdrawal on 10/27. He has required numerous frequent doses of lorazepam both oral and intravenous for elevated CIWA score and is refusing oral medications. He was put on IVC as he was threatening to leave AMA and determined to not have capacity to do so. Transfer to Stepdown initiated in order to start Precedex infusion to better control psychomotor agitation related to alcohol withdrawal. PCCM consulted for assistance with management of continuous Precedex.  Pertinent  Medical History  Alcohol abuse Hypertension  Significant Hospital Events: Including procedures, antibiotic start and stop dates in addition to other pertinent events   10/28: requiring frequent benzodiazepine administration for alcohol withdrawal 10/29: did well on Precedex and scheduled Ativan  Interim History / Subjective:  N/A  Objective   Blood pressure (!) 159/111, pulse 67, temperature 98.2 F (36.8 C), temperature source Oral, resp. rate (!) 8, height 5' 8"  (1.727 m), weight 99.8 kg, SpO2 99 %.        Intake/Output Summary (Last 24 hours) at 05/23/2021 0800 Last data filed at 05/23/2021 0719 Gross per 24 hour  Intake 2609.92 ml  Output 975 ml  Net 1634.92 ml   Filed Weights   05/20/21 1206  Weight: 99.8 kg    Examination: General: African American male in NAD, resting HENT: pupils equal and reactive, mosit OMM Lungs: CTA b/l, no W/C/R Cardiovascular: tachycardic, no M/R/G Abdomen: NTND, +BS Extremities: no C/C/E Neuro: resting comfortably in bed, no gross abnormalities GU: deferred  Resolved Hospital Problem list   N/A  Assessment & Plan:  Severe alcohol withdrawal Mild alcoholic  hepatitis Thrombocytopenia Electrolyte disturbances  - Wean Precedex; add clonidine taper - Continue Ativan; switch to PO and taper - Continue thiamine, folate, MVI - Replete electrolytes per protocol - Monitor serum electrolytes; repletion per protocol - Blood pressure control - PCCM will sign off once patient is off Precedex infusion  Best Practice (right click and "Reselect all SmartList Selections" daily)   Diet/type: Regular consistency (see orders) DVT prophylaxis: prophylactic heparin  GI prophylaxis: N/A Lines: N/A Foley:  N/A Code Status:  full code Last date of multidisciplinary goals of care discussion [n/a]  I Assessed the need for Labs I Assessed the need for Foley I Assessed the need for Central Venous Line Family Discussion when available I Assessed the need for Mobilization I made an Assessment of medications to be adjusted accordingly Safety Risk assessment completed  Labs   CBC: Recent Labs  Lab 05/20/21 1211 05/21/21 0707 05/22/21 0515 05/23/21 0435  WBC 8.7 8.1 10.2 8.1  HGB 15.3 14.8 15.3 13.6  HCT RESULTS UNAVAILABLE DUE TO INTERFERING SUBSTANCE RESULTS UNAVAILABLE DUE TO INTERFERING SUBSTANCE RESULTS UNAVAILABLE DUE TO INTERFERING SUBSTANCE 35.8*  MCV RESULTS UNAVAILABLE DUE TO INTERFERING SUBSTANCE RESULTS UNAVAILABLE DUE TO INTERFERING SUBSTANCE RESULTS UNAVAILABLE DUE TO INTERFERING SUBSTANCE 89.7  PLT 92* 83* 89* 88*    Basic Metabolic Panel: Recent Labs  Lab 05/20/21 1211 05/20/21 2008 05/21/21 0707 05/21/21 0800 05/22/21 0515 05/22/21 1959 05/23/21 0435  NA 137 135 133* 133* 131*  --  134*  K 2.9* 3.4* 2.7* 4.0 3.2* 3.3* 3.6  CL 94* 96* 96* 98 95*  --  100  CO2 19* 25 23  21* 21*  --  22  GLUCOSE 146* 105* 91 89 105*  --  109*  BUN 10 10 10 9 13   --  19  CREATININE 1.33* 1.03 1.02 0.88 1.00  --  1.05  CALCIUM 9.5 9.6 9.1 9.1 10.2  --  9.5  MG 1.7  --   --   --  1.6* 2.5* 2.0  PHOS 3.4  --   --   --   --  5.1* 4.3    GFR: Estimated Creatinine Clearance: 93.2 mL/min (by C-G formula based on SCr of 1.05 mg/dL). Recent Labs  Lab 05/20/21 1211 05/20/21 1400 05/20/21 1655 05/21/21 0707 05/22/21 0515 05/23/21 0435  WBC 8.7  --   --  8.1 10.2 8.1  LATICACIDVEN  --  2.9* 3.8*  --   --  0.9    Liver Function Tests: Recent Labs  Lab 05/20/21 1211 05/21/21 0707 05/21/21 0800 05/22/21 0515 05/23/21 0435  AST 198* 114* 112* 111* 80*  ALT 148* 109* 103* 99* 84*  ALKPHOS 75 66 62 70 58  BILITOT 1.8* 2.3* 2.3* 2.1* 1.8*  PROT 8.6* 7.4 7.4 7.6 6.9  ALBUMIN 4.4 4.0 3.7 3.9 3.3*   No results for input(s): LIPASE, AMYLASE in the last 168 hours. No results for input(s): AMMONIA in the last 168 hours.  ABG    Component Value Date/Time   TCO2 24 07/26/2017 2252     Coagulation Profile: Recent Labs  Lab 05/21/21 0707  INR 1.0    Cardiac Enzymes: Recent Labs  Lab 05/20/21 2008  CKTOTAL 641*    HbA1C: Hemoglobin A1C  Date/Time Value Ref Range Status  08/25/2017 10:11 AM 5.6  Final   Hgb A1c MFr Bld  Date/Time Value Ref Range Status  12/04/2019 12:12 PM 5.6 4.8 - 5.6 % Final    Comment:             Prediabetes: 5.7 - 6.4          Diabetes: >6.4          Glycemic control for adults with diabetes: <7.0     CBG: Recent Labs  Lab 05/21/21 1434 05/22/21 0645 05/22/21 1630 05/22/21 1952 05/23/21 0043  GLUCAP 101* 125* 100* 110* 96    Review of Systems:   Unable to assess due to patient's clinical status.  Past Medical History:  He,  has a past medical history of Hypertension.   Surgical History:  History reviewed. No pertinent surgical history.   Social History:   reports that he has been smoking. He has been smoking an average of .5 packs per day. He uses smokeless tobacco. He reports current alcohol use. He reports that he does not use drugs.   Family History:  His family history includes Heart attack in his father; Hypertension in his father and mother.    Allergies No Known Allergies   Home Medications  Prior to Admission medications   Medication Sig Start Date End Date Taking? Authorizing Provider  amLODipine (NORVASC) 10 MG tablet Take 1 tablet (10 mg total) by mouth daily. 05/25/20 05/25/21 Yes Chrismon, Vickki Muff, PA-C  chlorthalidone (HYGROTON) 25 MG tablet Take 1 tablet (25 mg total) by mouth daily. 06/08/20  Yes Chrismon, Vickki Muff, PA-C  MELATONIN ER PO Take by mouth.   Yes [provider]  Aspirin-Salicylamide-Caffeine (BC HEADACHE POWDER PO) Take 3 packets by mouth daily as needed (pain). Patient not taking: Reported on 05/20/2021    [provider]  Blood Pressure Monitor  KIT 1 kit by Does not apply route daily. 11/28/19   Iloabachie, Chioma E, NP  diclofenac (VOLTAREN) 75 MG EC tablet Take 1 tablet (75 mg total) by mouth 2 (two) times daily. Patient not taking: Reported on 05/20/2021 06/08/20   Chrismon, Vickki Muff, PA-C

## 2021-05-23 NOTE — Progress Notes (Addendum)
PROGRESS NOTE    Justin Moore  CBS:[id number] DOB: 1966/06/30 DOA: 05/20/2021 PCP: Tamsen Roers, PA-C (Inactive)    Brief Narrative:  Justin Moore is a 55 year old male with past medical history significant for essential hypertension, hyperlipidemia, EtOH use disorder, and medical noncompliance who presented to [facility] ED on 10/27 via EMS with tremors, confusion.  Also with nausea, fatigue, decreased appetite.  Patient normally drinks 1/5 of vodka daily, has not had a drink in 2-3 days.  Denied fever/chills, no recent illnesses.  No sick contacts.  Denies chest pain or shortness of breath.  In the ED, temperature 98.4 F, HR 122, RR 18, BP 224/132, SPO2 97% on room air.  137, potassium 2.9, chloride 94, CO2 19, BUN 10, creatinine 1.33, glucose 146, anion gap 24, phosphorus 3.4, magnesium 1.7, AST 198, ALT 148, total bilirubin 1.8.  Lactic acid 2.9.  WBC 8.7.  Platelets 92.  Salicylate level less than 7.0.  EtOH level less than 10.  Beta hydroxybutyrate acid 1.53.  UDS negative.  Influenza A/B PCR negative.  COVID-19 PCR negative.  CT head without contrast with no acute intracranial findings, atrophy.  Chest x-ray with no acute cardiopulmonary disease process.  Was noted to have a CIWA score initially greater than 20.  Patient was given IV Ativan.  Duration consulted for further evaluation and management for acute EtOH withdrawal.   Assessment & Plan:   Principal Problem:   Alcohol withdrawal delirium, acute, hyperactive (HCC) Active Problems:   Essential hypertension   Alcoholic hepatitis without ascites   Alcohol abuse   Lactic acidosis   Alcoholic ketoacidosis   Acute metabolic encephalopathy, POA Patient presenting to the ED via EMS after being found with tremors, intermittent confusion secondary to recent cessation from heavy alcohol use over the last 2-3 days.  Patient is afebrile.  Chest x-ray and CT head without contrast unrevealing.  EtOH level less than 10.  Salicylate  level less than 7.0.  Etiology likely secondary to active withdrawal. --Given Patient's continued confusion, active withdrawal symptoms, and threats to leave AMA, IVC was initiated as patient is danger to himself for discharge at this time given active withdrawal and continued confusion with high CIWA scores --One-to-one safety sitter --Continue treatment as below  Severe EtOH withdrawal Patient presenting to ED via EMS after recent abrupt cessation of heavy alcohol use.  EtOH level less than 10 on admission.  Reportedly drinks fifth of vodka daily.  Was noted to be tremulous, diaphoretic, with tachycardia, hypertension and confusion with CIWA score initially greater than 20 on ED presentation. --PCCM following; appreciate assistance --Weaning Precedex drip --CIWAA protocol symptom triggered Ativan --Clonidine/ativan taper --Thiamine, folic acid, multivitamin --LR w/ 20 mEq KCL at 158mL/h --Environmental education officer --TOC consult for substance abuse counseling  Hypertensive urgency/emergency On ED presentation, BP 224/132; etiology likely combination of medication noncompliance with antihypertensives as well as acute alcohol withdrawal as above. --Amlodipine 10 mg p.o. daily --Chlorthalidone 25 mg p.o. daily --Losartan 50mg  PO daily --Hydralazine 50 mg p.o. q6h --Labetalol 10mg  IV q2h prn SBP >165 --continue to monitor BP closely and will adjust as needed.  Anion gap metabolic acidosis Alcohol ketoacidosis Lactic acidosis: resolved On admission, anion gap 24, lactic acid 2.4, beta hydroxybutyrate acid 1.53, AST 198, ALT 148.  Likely secondary to chronic alcohol abuse. --LA 2.4>0.9 --AG 24>12 --Continue to trend CMP daily --Repeat lactic acid in a.m. --Supportive care, IV fluid hydration  Alcoholic hepatitis Likely secondary to chronic alcohol abuse. --AST 198>112>111>80 --ALT 148>103>99>84 --  Avoid hepatotoxins --CMP daily  Thrombocytopenia Platelets 92, likely secondary to  chronic alcohol abuse. --SCDs for DVT prophylaxis --CBC daily  Hypokalemia Hypomagnesemia Potassium 2.9 on admission, likely secondary to poor oral intake in the setting of EtOH abuse.  Repleted. --K 3.4, Mag 2.0 this am; replete K today --Repeat electrolytes in the a.m.   DVT prophylaxis: SCDs Start: 05/20/21 1411   Code Status: Full Code Family Communication: No family present at bedside this morning.  Disposition Plan:  Level of care: Stepdown Status is: Inpatient  Remains inpatient appropriate because: Remains in acute alcohol withdrawal, confused, on IVC   Consultants:  PCCM  Procedures:  Precedex drip 10/29>>  Antimicrobials:  None   Subjective: Patient seen examined at bedside, lying in bed.  Sleeping but arousable.  Sitter present.  Confusion improved, continues with mild tremors.  PCCM weaning Precedex drip today.  Started on clonidine taper.  No family present at bedside.  Discussed with RN at bedside.  Patient ate breakfast this morning.  No other questions or concerns at this time.  Denies headache, no chest pain, no shortness of breath, no abdominal pain.  No acute events overnight per nursing staff.  Objective: Vitals:   05/23/21 0400 05/23/21 0500 05/23/21 0700 05/23/21 0830  BP: (!) 157/105 (!) 157/112 (!) 159/111 134/89  Pulse: 71 74 67 78  Resp:  17 (!) 8 15  Temp:  98.2 F (36.8 C)  98.9 F (37.2 C)  TempSrc:  Oral  Oral  SpO2:  99% 99% 95%  Weight:      Height:        Intake/Output Summary (Last 24 hours) at 05/23/2021 0938 Last data filed at 05/23/2021 0830 Gross per 24 hour  Intake 3051.79 ml  Output 1425 ml  Net 1626.79 ml   Filed Weights   05/20/21 1206  Weight: 99.8 kg    Examination:  General exam: Intermittently confused, tremulous; ill in appearance Respiratory system: Clear to auscultation. Respiratory effort normal.  On room air Cardiovascular system: S1 & S2 heard, RRR. No JVD, murmurs, rubs, gallops or clicks. No pedal  edema. Gastrointestinal system: Abdomen is nondistended, soft and nontender. No organomegaly or masses felt. Normal bowel sounds heard. Central nervous system: Alert, oriented to person/place/time but not situation. No focal neurological deficits. Extremities: Symmetric 5 x 5 power. Skin: No rashes, lesions or ulcers Psychiatry: Judgement and insight appear poor.     Data Reviewed: I have personally reviewed following labs and imaging studies  CBC: Recent Labs  Lab 05/20/21 1211 05/21/21 0707 05/22/21 0515 05/23/21 0435  WBC 8.7 8.1 10.2 8.1  HGB 15.3 14.8 15.3 13.6  HCT RESULTS UNAVAILABLE DUE TO INTERFERING SUBSTANCE RESULTS UNAVAILABLE DUE TO INTERFERING SUBSTANCE RESULTS UNAVAILABLE DUE TO INTERFERING SUBSTANCE 35.8*  MCV RESULTS UNAVAILABLE DUE TO INTERFERING SUBSTANCE RESULTS UNAVAILABLE DUE TO INTERFERING SUBSTANCE RESULTS UNAVAILABLE DUE TO INTERFERING SUBSTANCE 89.7  PLT 92* 83* 89* 88*   Basic Metabolic Panel: Recent Labs  Lab 05/20/21 1211 05/20/21 2008 05/21/21 0707 05/21/21 0800 05/22/21 0515 05/22/21 1959 05/23/21 0435  NA 137 135 133* 133* 131*  --  134*  K 2.9* 3.4* 2.7* 4.0 3.2* 3.3* 3.6  CL 94* 96* 96* 98 95*  --  100  CO2 19* 25 23 21* 21*  --  22  GLUCOSE 146* 105* 91 89 105*  --  109*  BUN 10 10 10 9 13   --  19  CREATININE 1.33* 1.03 1.02 0.88 1.00  --  1.05  CALCIUM 9.5  9.6 9.1 9.1 10.2  --  9.5  MG 1.7  --   --   --  1.6* 2.5* 2.0  PHOS 3.4  --   --   --   --  5.1* 4.3   GFR: Estimated Creatinine Clearance: 93.2 mL/min (by C-G formula based on SCr of 1.05 mg/dL). Liver Function Tests: Recent Labs  Lab 05/20/21 1211 05/21/21 0707 05/21/21 0800 05/22/21 0515 05/23/21 0435  AST 198* 114* 112* 111* 80*  ALT 148* 109* 103* 99* 84*  ALKPHOS 75 66 62 70 58  BILITOT 1.8* 2.3* 2.3* 2.1* 1.8*  PROT 8.6* 7.4 7.4 7.6 6.9  ALBUMIN 4.4 4.0 3.7 3.9 3.3*   No results for input(s): LIPASE, AMYLASE in the last 168 hours. No results for input(s):  AMMONIA in the last 168 hours. Coagulation Profile: Recent Labs  Lab 05/21/21 0707  INR 1.0   Cardiac Enzymes: Recent Labs  Lab 05/20/21 2008  CKTOTAL 641*   BNP (last 3 results) No results for input(s): PROBNP in the last 8760 hours. HbA1C: No results for input(s): HGBA1C in the last 72 hours. CBG: Recent Labs  Lab 05/21/21 1434 05/22/21 0645 05/22/21 1630 05/22/21 1952 05/23/21 0043  GLUCAP 101* 125* 100* 110* 96   Lipid Profile: No results for input(s): CHOL, HDL, LDLCALC, TRIG, CHOLHDL, LDLDIRECT in the last 72 hours. Thyroid Function Tests: No results for input(s): TSH, T4TOTAL, FREET4, T3FREE, THYROIDAB in the last 72 hours. Anemia Panel: No results for input(s): VITAMINB12, FOLATE, FERRITIN, TIBC, IRON, RETICCTPCT in the last 72 hours. Sepsis Labs: Recent Labs  Lab 05/20/21 1400 05/20/21 1655 05/23/21 0435  LATICACIDVEN 2.9* 3.8* 0.9    Recent Results (from the past 240 hour(s))  Resp Panel by RT-PCR (Flu A&B, Covid) Nasopharyngeal Swab     Status: None   Collection Time: 05/20/21  2:00 PM   Specimen: Nasopharyngeal Swab; Nasopharyngeal(NP) swabs in vial transport medium  Result Value Ref Range Status   SARS Coronavirus 2 by RT PCR NEGATIVE NEGATIVE Final    Comment: (NOTE) SARS-CoV-2 target nucleic acids are NOT DETECTED.  The SARS-CoV-2 RNA is generally detectable in upper respiratory specimens during the acute phase of infection. The lowest concentration of SARS-CoV-2 viral copies this assay can detect is 138 copies/mL. A negative result does not preclude SARS-Cov-2 infection and should not be used as the sole basis for treatment or other patient management decisions. A negative result may occur with  improper specimen collection/handling, submission of specimen other than nasopharyngeal swab, presence of viral mutation(s) within the areas targeted by this assay, and inadequate number of viral copies(<138 copies/mL). A negative result must be  combined with clinical observations, patient history, and epidemiological information. The expected result is Negative.  Fact Sheet for Patients:  BloggerCourse.com  Fact Sheet for Healthcare Providers:  SeriousBroker.it  This test is no t yet approved or cleared by the [place] FDA and  has been authorized for detection and/or diagnosis of SARS-CoV-2 by FDA under an Emergency Use Authorization (EUA). This EUA will remain  in effect (meaning this test can be used) for the duration of the COVID-19 declaration under Section 564(b)(1) of the Act, 21 U.S.C.section 360bbb-3(b)(1), unless the authorization is terminated  or revoked sooner.       Influenza A by PCR NEGATIVE NEGATIVE Final   Influenza B by PCR NEGATIVE NEGATIVE Final    Comment: (NOTE) The Xpert Xpress SARS-CoV-2/FLU/RSV plus assay is intended as an aid in the diagnosis of influenza from Nasopharyngeal swab  specimens and should not be used as a sole basis for treatment. Nasal washings and aspirates are unacceptable for Xpert Xpress SARS-CoV-2/FLU/RSV testing.  Fact Sheet for Patients: BloggerCourse.com  Fact Sheet for Healthcare Providers: SeriousBroker.it  This test is not yet approved or cleared by the [place] FDA and has been authorized for detection and/or diagnosis of SARS-CoV-2 by FDA under an Emergency Use Authorization (EUA). This EUA will remain in effect (meaning this test can be used) for the duration of the COVID-19 declaration under Section 564(b)(1) of the Act, 21 U.S.C. section 360bbb-3(b)(1), unless the authorization is terminated or revoked.  Performed at [facility] [place], [address] [place], [place] [address]   MRSA Next Gen by PCR, Nasal     Status: None   Collection Time: 05/22/21  4:41 PM   Specimen: Nasal Mucosa; Nasal Swab  Result Value Ref Range Status   MRSA by PCR  Next Gen NOT DETECTED NOT DETECTED Final    Comment: (NOTE) The GeneXpert MRSA Assay (FDA approved for NASAL specimens only), is one component of a comprehensive MRSA colonization surveillance program. It is not intended to diagnose MRSA infection nor to guide or monitor treatment for MRSA infections. Test performance is not FDA approved in patients less than 2 years old. Performed at [facility], [address] [place], [place] [address]          Radiology Studies: No results found.      Scheduled Meds:  amLODipine  10 mg Oral Daily   Chlorhexidine Gluconate Cloth  6 each Topical Daily   chlorthalidone  25 mg Oral Daily   cloNIDine  0.2 mg Oral TID   Followed by   Melene Muller ON 05/24/2021] cloNIDine  0.1 mg Oral TID   Followed by   Melene Muller ON 05/25/2021] cloNIDine  0.1 mg Oral BID   folic acid  1 mg Oral Daily   hydrALAZINE  50 mg Oral Q6H   LORazepam  1 mg Oral TID   Followed by   Melene Muller ON 05/24/2021] LORazepam  1 mg Oral BID   Followed by   Melene Muller ON 05/25/2021] LORazepam  0.5 mg Oral BID   losartan  50 mg Oral Daily   multivitamin with minerals  1 tablet Oral Daily   thiamine  100 mg Oral Daily   Continuous Infusions:  dexmedetomidine (PRECEDEX) IV infusion 0.8 mcg/kg/hr (05/23/21 0800)     LOS: 3 days    Critical Care Time Upon my evaluation, this patient had a high probability of imminent or life-threatening deterioration due to acute severe EtOH withdrawal requiring Precedex drip, which required my direct attention, intervention, and personal management.  I have personally provided 52 minutes of critical care time exclusive of my time spent on separately billable procedures.  Time includes review of laboratory data, radiology results, discussion with consultants, and monitoring for potential decompensation.       Alvira Philips [place], DO [facility] Available via Epic secure chat 7am-7pm After these hours, please refer to coverage provider  listed on [email or web address] 05/23/2021, 9:38 AM

## 2021-05-23 NOTE — Progress Notes (Signed)
No significant events during shift. Patient pleasantly confused at times. Wife at bedside and sitter.

## 2021-05-24 LAB — COMPREHENSIVE METABOLIC PANEL
ALT: 101 U/L — ABNORMAL HIGH (ref 0–44)
AST: 99 U/L — ABNORMAL HIGH (ref 15–41)
Albumin: 3.7 g/dL (ref 3.5–5.0)
Alkaline Phosphatase: 65 U/L (ref 38–126)
Anion gap: 10 (ref 5–15)
BUN: 17 mg/dL (ref 6–20)
CO2: 25 mmol/L (ref 22–32)
Calcium: 10.2 mg/dL (ref 8.9–10.3)
Chloride: 97 mmol/L — ABNORMAL LOW (ref 98–111)
Creatinine, Ser: 1.06 mg/dL (ref 0.61–1.24)
GFR, Estimated: >60 mL/min (ref >60)
Glucose, Bld: 112 mg/dL — ABNORMAL HIGH (ref 70–99)
Potassium: 3.2 mmol/L — ABNORMAL LOW (ref 3.5–5.1)
Sodium: 132 mmol/L — ABNORMAL LOW (ref 135–145)
Total Bilirubin: 1.4 mg/dL — ABNORMAL HIGH (ref 0.3–1.2)
Total Protein: 7.1 g/dL (ref 6.5–8.1)

## 2021-05-24 LAB — MAGNESIUM: Magnesium: 1.7 mg/dL (ref 1.7–2.4)

## 2021-05-24 MED ORDER — TRAMADOL HCL 50 MG PO TABS
50.0000 mg | ORAL_TABLET | Freq: Once | ORAL | Status: AC
  Administered 2021-05-24: 50 mg via ORAL
  Filled 2021-05-24: qty 1

## 2021-05-24 MED ORDER — MAGNESIUM SULFATE 2 GM/50ML IV SOLN
2.0000 g | Freq: Once | INTRAVENOUS | Status: AC
  Administered 2021-05-24: 2 g via INTRAVENOUS
  Filled 2021-05-24: qty 50

## 2021-05-24 MED ORDER — LOSARTAN POTASSIUM 50 MG PO TABS
100.0000 mg | ORAL_TABLET | Freq: Every day | ORAL | Status: DC
  Administered 2021-05-24 – 2021-05-25 (×2): 100 mg via ORAL
  Filled 2021-05-24 (×2): qty 2

## 2021-05-24 MED ORDER — POTASSIUM CHLORIDE CRYS ER 20 MEQ PO TBCR
40.0000 meq | EXTENDED_RELEASE_TABLET | ORAL | Status: AC
  Administered 2021-05-24 (×2): 40 meq via ORAL
  Filled 2021-05-24 (×2): qty 2

## 2021-05-24 NOTE — Progress Notes (Signed)
PROGRESS NOTE    Justin Moore  JQB:[id number] DOB: 04-16-1966 DOA: 05/20/2021 PCP: Tamsen Roers, PA-C (Inactive)    Brief Narrative:  Justin Moore is a 55 year old male with past medical history significant for essential hypertension, hyperlipidemia, EtOH use disorder, and medical noncompliance who presented to [facility] ED on 10/27 via EMS with tremors, confusion.  Also with nausea, fatigue, decreased appetite.  Patient normally drinks 1/5 of vodka daily, has not had a drink in 2-3 days.  Denied fever/chills, no recent illnesses.  No sick contacts.  Denies chest pain or shortness of breath.  In the ED, temperature 98.4 F, HR 122, RR 18, BP 224/132, SPO2 97% on room air.  137, potassium 2.9, chloride 94, CO2 19, BUN 10, creatinine 1.33, glucose 146, anion gap 24, phosphorus 3.4, magnesium 1.7, AST 198, ALT 148, total bilirubin 1.8.  Lactic acid 2.9.  WBC 8.7.  Platelets 92.  Salicylate level less than 7.0.  EtOH level less than 10.  Beta hydroxybutyrate acid 1.53.  UDS negative.  Influenza A/B PCR negative.  COVID-19 PCR negative.  CT head without contrast with no acute intracranial findings, atrophy.  Chest x-ray with no acute cardiopulmonary disease process.  Was noted to have a CIWA score initially greater than 20.  Patient was given IV Ativan.  Duration consulted for further evaluation and management for acute EtOH withdrawal.   Assessment & Plan:   Principal Problem:   Alcohol withdrawal delirium, acute, hyperactive (HCC) Active Problems:   Essential hypertension   Alcoholic hepatitis without ascites   Alcohol abuse   Lactic acidosis   Alcoholic ketoacidosis   Acute metabolic encephalopathy, POA Patient presenting to the ED via EMS after being found with tremors, intermittent confusion secondary to recent cessation from heavy alcohol use over the last 2-3 days.  Patient is afebrile.  Chest x-ray and CT head without contrast unrevealing.  EtOH level less than 10.  Salicylate  level less than 7.0.  Etiology likely secondary to active withdrawal.  Patient was transferred to the ICU and started on Precedex drip which has now been weaned off. --IVC now discontinued given his mental status has markedly improved, close to baseline --Continue treatment as below  Severe EtOH withdrawal Patient presenting to ED via EMS after recent abrupt cessation of heavy alcohol use.  EtOH level less than 10 on admission.  Reportedly drinks fifth of vodka daily.  Was noted to be tremulous, diaphoretic, with tachycardia, hypertension and confusion with CIWA score initially greater than 20 on ED presentation.  Patient continued to be combative, threatening to leave AMA and was transferred to the ICU and initiated on Precedex drip, now weaned off. --CIWAA protocol symptom triggered Ativan --Clonidine/ativan taper --Thiamine, folic acid, multivitamin --TOC consult for substance abuse counseling  Hypertensive urgency/emergency On ED presentation, BP 224/132; etiology likely combination of medication noncompliance with antihypertensives as well as acute alcohol withdrawal as above. --Amlodipine 10 mg p.o. daily --Chlorthalidone 25 mg p.o. daily --Losartan 1000mg  PO daily --Hydralazine 50 mg p.o. q6h --Labetalol 10mg  IV q2h prn SBP >165 --continue to monitor BP closely and will adjust as needed.  Anion gap metabolic acidosis Alcohol ketoacidosis Lactic acidosis: resolved On admission, anion gap 24, lactic acid 2.4, beta hydroxybutyrate acid 1.53, AST 198, ALT 148.  Likely secondary to chronic alcohol abuse. --LA 2.4>0.9 --AG 24>12 --Continue to trend CMP daily --Repeat lactic acid in a.m. --Supportive care, IV fluid hydration  Alcoholic hepatitis Likely secondary to chronic alcohol abuse. --AST 198>112>111>80>99 --ALT 148>103>99>84>101 --Avoid  hepatotoxins --CMP daily  Thrombocytopenia Platelets 92, likely secondary to chronic alcohol abuse. --SCDs for DVT  prophylaxis  Hypokalemia Hypomagnesemia Potassium 2.9 on admission, likely secondary to poor oral intake in the setting of EtOH abuse.  Repleted. --K 3.2, Mag 1.7 this am; will replete  --Repeat electrolytes in the a.m.   DVT prophylaxis: SCDs Start: 05/20/21 1411   Code Status: Full Code Family Communication: Updated patient spouse was present at bedside this morning  Disposition Plan:  Level of care: Med-Surg Status is: Inpatient  Remains inpatient appropriate because: Blood pressure remains poorly controlled, continues with high CIWA scores requiring IV Ativan and on Ativan/clonidine taper.  Okay for transfer out of ICU today.   Consultants:  PCCM - signed off 10/30  Procedures:  Precedex drip 10/29 - 10/30  Antimicrobials:  None   Subjective: Patient seen examined at bedside, lying in bed.  Spouse present.  Precedex drip titrated off yesterday.  Confusion much improved and close to baseline.  We will discontinue sitter/IVC today as patient now with capacity for medical decision-making.  Patient blood pressure remains poorly controlled.  Tremors much improved.  No other questions or concerns at this time.  Denies headache, no chest pain, no shortness of breath, no abdominal pain.  No acute events overnight per nursing staff.  Objective: Vitals:   05/24/21 1115 05/24/21 1200 05/24/21 1300 05/24/21 1308  BP:  118/84    Pulse: (!) 102   (!) 110  Resp:  15 16   Temp:      TempSrc:      SpO2:      Weight:      Height:        Intake/Output Summary (Last 24 hours) at 05/24/2021 1348 Last data filed at 05/24/2021 0454 Gross per 24 hour  Intake 240 ml  Output 1700 ml  Net -1460 ml   Filed Weights   05/20/21 1206  Weight: 99.8 kg    Examination:  General exam: Calm and comfortable in appearance, NAD Respiratory system: Clear to auscultation. Respiratory effort normal.  On room air Cardiovascular system: S1 & S2 heard, RRR. No JVD, murmurs, rubs, gallops or  clicks. No pedal edema. Gastrointestinal system: Abdomen is nondistended, soft and nontender. No organomegaly or masses felt. Normal bowel sounds heard. Central nervous system: Alert, oriented to person/place/tim/situation. No focal neurological deficits. Extremities: Symmetric 5 x 5 power. Skin: No rashes, lesions or ulcers Psychiatry: Judgement and insight appear poor.     Data Reviewed: I have personally reviewed following labs and imaging studies  CBC: Recent Labs  Lab 05/20/21 1211 05/21/21 0707 05/22/21 0515 05/23/21 0435  WBC 8.7 8.1 10.2 8.1  HGB 15.3 14.8 15.3 13.6  HCT RESULTS UNAVAILABLE DUE TO INTERFERING SUBSTANCE RESULTS UNAVAILABLE DUE TO INTERFERING SUBSTANCE RESULTS UNAVAILABLE DUE TO INTERFERING SUBSTANCE 35.8*  MCV RESULTS UNAVAILABLE DUE TO INTERFERING SUBSTANCE RESULTS UNAVAILABLE DUE TO INTERFERING SUBSTANCE RESULTS UNAVAILABLE DUE TO INTERFERING SUBSTANCE 89.7  PLT 92* 83* 89* 88*   Basic Metabolic Panel: Recent Labs  Lab 05/20/21 1211 05/20/21 2008 05/21/21 0707 05/21/21 0800 05/22/21 0515 05/22/21 1959 05/23/21 0435 05/24/21 0548  NA 137   < > 133* 133* 131*  --  134* 132*  K 2.9*   < > 2.7* 4.0 3.2* 3.3* 3.6 3.2*  CL 94*   < > 96* 98 95*  --  100 97*  CO2 19*   < > 23 21* 21*  --  22 25  GLUCOSE 146*   < > 91  89 105*  --  109* 112*  BUN 10   < > 10 9 13   --  19 17  CREATININE 1.33*   < > 1.02 0.88 1.00  --  1.05 1.06  CALCIUM 9.5   < > 9.1 9.1 10.2  --  9.5 10.2  MG 1.7  --   --   --  1.6* 2.5* 2.0 1.7  PHOS 3.4  --   --   --   --  5.1* 4.3  --    < > = values in this interval not displayed.   GFR: Estimated Creatinine Clearance: 92.3 mL/min (by C-G formula based on SCr of 1.06 mg/dL). Liver Function Tests: Recent Labs  Lab 05/21/21 0707 05/21/21 0800 05/22/21 0515 05/23/21 0435 05/24/21 0548  AST 114* 112* 111* 80* 99*  ALT 109* 103* 99* 84* 101*  ALKPHOS 66 62 70 58 65  BILITOT 2.3* 2.3* 2.1* 1.8* 1.4*  PROT 7.4 7.4 7.6 6.9 7.1   ALBUMIN 4.0 3.7 3.9 3.3* 3.7   No results for input(s): LIPASE, AMYLASE in the last 168 hours. No results for input(s): AMMONIA in the last 168 hours. Coagulation Profile: Recent Labs  Lab 05/21/21 0707  INR 1.0   Cardiac Enzymes: Recent Labs  Lab 05/20/21 2008  CKTOTAL 641*   BNP (last 3 results) No results for input(s): PROBNP in the last 8760 hours. HbA1C: No results for input(s): HGBA1C in the last 72 hours. CBG: Recent Labs  Lab 05/21/21 1434 05/22/21 0645 05/22/21 1630 05/22/21 1952 05/23/21 0043  GLUCAP 101* 125* 100* 110* 96   Lipid Profile: No results for input(s): CHOL, HDL, LDLCALC, TRIG, CHOLHDL, LDLDIRECT in the last 72 hours. Thyroid Function Tests: No results for input(s): TSH, T4TOTAL, FREET4, T3FREE, THYROIDAB in the last 72 hours. Anemia Panel: No results for input(s): VITAMINB12, FOLATE, FERRITIN, TIBC, IRON, RETICCTPCT in the last 72 hours. Sepsis Labs: Recent Labs  Lab 05/20/21 1400 05/20/21 1655 05/23/21 0435  LATICACIDVEN 2.9* 3.8* 0.9    Recent Results (from the past 240 hour(s))  Resp Panel by RT-PCR (Flu A&B, Covid) Nasopharyngeal Swab     Status: None   Collection Time: 05/20/21  2:00 PM   Specimen: Nasopharyngeal Swab; Nasopharyngeal(NP) swabs in vial transport medium  Result Value Ref Range Status   SARS Coronavirus 2 by RT PCR NEGATIVE NEGATIVE Final    Comment: (NOTE) SARS-CoV-2 target nucleic acids are NOT DETECTED.  The SARS-CoV-2 RNA is generally detectable in upper respiratory specimens during the acute phase of infection. The lowest concentration of SARS-CoV-2 viral copies this assay can detect is 138 copies/mL. A negative result does not preclude SARS-Cov-2 infection and should not be used as the sole basis for treatment or other patient management decisions. A negative result may occur with  improper specimen collection/handling, submission of specimen other than nasopharyngeal swab, presence of viral mutation(s)  within the areas targeted by this assay, and inadequate number of viral copies(<138 copies/mL). A negative result must be combined with clinical observations, patient history, and epidemiological information. The expected result is Negative.  Fact Sheet for Patients:  05/22/21  Fact Sheet for Healthcare Providers:  BloggerCourse.com  This test is no t yet approved or cleared by the [place] FDA and  has been authorized for detection and/or diagnosis of SARS-CoV-2 by FDA under an Emergency Use Authorization (EUA). This EUA will remain  in effect (meaning this test can be used) for the duration of the COVID-19 declaration under Section 564(b)(1) of the  Act, 21 U.S.C.section 360bbb-3(b)(1), unless the authorization is terminated  or revoked sooner.       Influenza A by PCR NEGATIVE NEGATIVE Final   Influenza B by PCR NEGATIVE NEGATIVE Final    Comment: (NOTE) The Xpert Xpress SARS-CoV-2/FLU/RSV plus assay is intended as an aid in the diagnosis of influenza from Nasopharyngeal swab specimens and should not be used as a sole basis for treatment. Nasal washings and aspirates are unacceptable for Xpert Xpress SARS-CoV-2/FLU/RSV testing.  Fact Sheet for Patients: BloggerCourse.com  Fact Sheet for Healthcare Providers: SeriousBroker.it  This test is not yet approved or cleared by the [place] FDA and has been authorized for detection and/or diagnosis of SARS-CoV-2 by FDA under an Emergency Use Authorization (EUA). This EUA will remain in effect (meaning this test can be used) for the duration of the COVID-19 declaration under Section 564(b)(1) of the Act, 21 U.S.C. section 360bbb-3(b)(1), unless the authorization is terminated or revoked.  Performed at [facility], [address] [place], [place] [address]   MRSA Next Gen by PCR, Nasal     Status: None    Collection Time: 05/22/21  4:41 PM   Specimen: Nasal Mucosa; Nasal Swab  Result Value Ref Range Status   MRSA by PCR Next Gen NOT DETECTED NOT DETECTED Final    Comment: (NOTE) The GeneXpert MRSA Assay (FDA approved for NASAL specimens only), is one component of a comprehensive MRSA colonization surveillance program. It is not intended to diagnose MRSA infection nor to guide or monitor treatment for MRSA infections. Test performance is not FDA approved in patients less than 60 years old. Performed at [facility], [address], [place], [place] [address]          Radiology Studies: No results found.      Scheduled Meds:  amLODipine  10 mg Oral Daily   Chlorhexidine Gluconate Cloth  6 each Topical Daily   chlorthalidone  25 mg Oral Daily   cloNIDine  0.1 mg Oral TID   Followed by   Melene Muller ON 05/25/2021] cloNIDine  0.1 mg Oral BID   folic acid  1 mg Oral Daily   hydrALAZINE  50 mg Oral Q6H   LORazepam  1 mg Oral BID   Followed by   Melene Muller ON 05/25/2021] LORazepam  0.5 mg Oral BID   losartan  100 mg Oral Daily   melatonin  5 mg Oral QHS   multivitamin with minerals  1 tablet Oral Daily   thiamine  100 mg Oral Daily   Continuous Infusions:     LOS: 4 days    Time spent: 39 minutes spent on chart review, discussion with nursing staff, consultants, updating family and interview/physical exam; more than 50% of that time was spent in counseling and/or coordination of care.     Alvira Philips [place], DO [facility] Available via Epic secure chat 7am-7pm After these hours, please refer to coverage provider listed on [email or web address] 05/24/2021, 1:48 PM

## 2021-05-24 NOTE — TOC Initial Note (Signed)
Transition of Care [facility]) - Initial/Assessment Note    Patient Details  Name: Justin Moore MRN: [id number] Date of Birth: Nov 16, 1965  Transition of Care [facility]) CM/SW Contact:    Ova Freshwater Phone Number:  [phone] 05/24/2021, 10:21 AM  Clinical Narrative:                  Patient presents to [facility] from home due to alcohol withdrawal.  Patient has hx of daily alcohol use, typically a 5th, stopped "cold [place]". Patient met with TTS and stated he did not want help with his substance use. Patient is uninsured. CSW called patient's main contact Blubaugh,Jennifer (Spouse) [phone] (Mobile), but was unable to leave voicemail, it is not set up.  Patient remains confused.  Is currently IVC, due to desire to leave AMA and possible safety concerns.  Expected Discharge Plan: [place] Barriers to Discharge: Continued Medical Work up, Inadequate or no insurance   Patient Goals and CMS Choice        Expected Discharge Plan and Services Expected Discharge Plan: [place] In-house Referral: Clinical Social Work   Post Acute Care Choice: [place] arrangements for the past 2 months: [place]                                      Prior Living Arrangements/Services Living arrangements for the past 2 months: Single Family Home Lives with:: Spouse (Cowdery,Jennifer (Spouse)   [phone] (Mobile)) Patient language and need for interpreter reviewed:: Yes Do you feel safe going back to the place where you live?: Yes      Need for Family Participation in Patient Care: Yes (Comment) Care giver support system in place?: Yes (comment)   Criminal Activity/Legal Involvement Pertinent to Current Situation/Hospitalization: No - Comment as needed  Activities of Daily Living Home Assistive Devices/Equipment: None ADL Screening (condition at time of admission) Patient's cognitive ability adequate to safely complete daily  activities?: Yes Is the patient deaf or have difficulty hearing?: No Does the patient have difficulty seeing, even when wearing glasses/contacts?: No Does the patient have difficulty concentrating, remembering, or making decisions?: No Patient able to express need for assistance with ADLs?: Yes Does the patient have difficulty dressing or bathing?: No Independently performs ADLs?: No Communication: Independent Dressing (OT): Independent Grooming: Independent Feeding: Independent Bathing: Independent Toileting: Independent In/Out Bed: Independent Walks in Home: Independent Does the patient have difficulty walking or climbing stairs?: No Weakness of Legs: None Weakness of Arms/Hands: None  Permission Sought/Granted Permission sought to share information with : Family Supports    Share Information with NAME: Antos,Jennifer (Spouse)   [phone] (Mobile)           Emotional Assessment Appearance:: Appears stated age Attitude/Demeanor/Rapport: Unable to Assess Affect (typically observed): Unable to Assess Orientation: : Fluctuating Orientation (Suspected and/or reported [place]) Alcohol / Substance Use: Alcohol Use Psych Involvement: No (comment)  Admission diagnosis:  Hypokalemia [T02.1] Alcoholic ketoacidosis [R17.35] CHF (congestive heart failure) ([place]) [I50.9] Alcohol withdrawal seizure ([place]) [A70.141, R56.9] Acute hyperactive alcohol withdrawal delirium ([place]) [F10.931] Patient Active Problem List   Diagnosis Date Noted   Alcohol withdrawal delirium, acute, hyperactive ([place]) 09/22/3141   Alcoholic hepatitis without ascites 05/20/2021   Alcohol abuse 05/20/2021   Lactic acidosis 88/87/5797   Alcoholic ketoacidosis 28/20/6015   Smoking 12/05/2019   Elevated lipids 12/05/2019   Leukocytosis 12/05/2019  Encounter to establish care 11/28/2019   Essential hypertension 11/28/2019   PCP:  Margo Common, PA-C (Inactive) Pharmacy:   [id number]  [place], [place]Phone: [phone]: [phone]    Social Determinants of Health (SDOH) Interventions    Readmission Risk Interventions No flowsheet data found.

## 2021-05-24 NOTE — Progress Notes (Signed)
0900 Talked in detail to patient about alcohol abuse and how he will have many struggles to stay sober. Also discussed taking blood pressure medications and how that affects his kidneys as well as possibilities of a stroke. Stated he understood . He has been treated x 2 today for increased tremors and agitation from ETOH withdraw.

## 2021-05-25 LAB — CBC
HCT: 38.2 % — ABNORMAL LOW (ref 39.0–52.0)
Hemoglobin: 14.3 g/dL (ref 13.0–17.0)
MCH: 34 pg (ref 26.0–34.0)
MCHC: 37.4 g/dL — ABNORMAL HIGH (ref 30.0–36.0)
MCV: 90.7 fL (ref 80.0–100.0)
Platelets: 155 10*3/uL (ref 150–400)
RBC: 4.21 MIL/uL — ABNORMAL LOW (ref 4.22–5.81)
RDW: 12.7 % (ref 11.5–15.5)
WBC: 7.2 10*3/uL (ref 4.0–10.5)
nRBC: 0 % (ref 0.0–0.2)

## 2021-05-25 LAB — COMPREHENSIVE METABOLIC PANEL
ALT: 180 U/L — ABNORMAL HIGH (ref 0–44)
AST: 196 U/L — ABNORMAL HIGH (ref 15–41)
Albumin: 3.8 g/dL (ref 3.5–5.0)
Alkaline Phosphatase: 80 U/L (ref 38–126)
Anion gap: 8 (ref 5–15)
BUN: 24 mg/dL — ABNORMAL HIGH (ref 6–20)
CO2: 24 mmol/L (ref 22–32)
Calcium: 10.5 mg/dL — ABNORMAL HIGH (ref 8.9–10.3)
Chloride: 99 mmol/L (ref 98–111)
Creatinine, Ser: 1.19 mg/dL (ref 0.61–1.24)
GFR, Estimated: >60 mL/min (ref >60)
Glucose, Bld: 114 mg/dL — ABNORMAL HIGH (ref 70–99)
Potassium: 3.8 mmol/L (ref 3.5–5.1)
Sodium: 131 mmol/L — ABNORMAL LOW (ref 135–145)
Total Bilirubin: 1.4 mg/dL — ABNORMAL HIGH (ref 0.3–1.2)
Total Protein: 7.2 g/dL (ref 6.5–8.1)

## 2021-05-25 LAB — MAGNESIUM: Magnesium: 1.7 mg/dL (ref 1.7–2.4)

## 2021-05-25 MED ORDER — HYDRALAZINE HCL 100 MG PO TABS: 100.0000 mg | ORAL_TABLET | Freq: Three times a day (TID) | ORAL | 2 refills | Status: DC

## 2021-05-25 MED ORDER — CHLORTHALIDONE 25 MG PO TABS: 25.0000 mg | ORAL_TABLET | Freq: Every day | ORAL | 2 refills | Status: DC

## 2021-05-25 MED ORDER — LOSARTAN POTASSIUM 100 MG PO TABS: 100.0000 mg | ORAL_TABLET | Freq: Every day | ORAL | 2 refills | Status: DC

## 2021-05-25 MED ORDER — AMLODIPINE BESYLATE 10 MG PO TABS: 10.0000 mg | ORAL_TABLET | Freq: Every day | ORAL | 2 refills | Status: DC

## 2021-05-25 MED ORDER — HYDRALAZINE HCL 50 MG PO TABS: 100.0000 mg | ORAL_TABLET | Freq: Three times a day (TID) | ORAL | Status: DC

## 2021-05-25 NOTE — Evaluation (Signed)
Physical Therapy Evaluation Patient Details Name: Justin Moore MRN: [id number] DOB: [birth year] Today's Date: 05/25/2021  History of Present Illness  Justin Moore is a 55 y.o. male with h/o alcohol abuse admitted with suspected alcohol withdrawal and confusion on 10/27. He has required numerous frequent doses of lorazepam both oral and intravenous for elevated CIWA score and is refusing oral medications. PMH: HTN, hyperlipidemia   Clinical Impression  Pt is a pleasant 55 year old male who presents to PT evaluation after admission for ETOH withdraw symptoms. Prior to admission, pt was independent with all aspects of mobility without assistive devices and was working full time as a Surveyor, [employer]. At PT evaluation, pt able to complete bed mobility independently, transfers with SPV, and ambulated 350 ft with CGA and no assistive device. Pt demonstrating multiple instances of scissoring during gait trail requiring CGA to maintain balance. Pt demonstrating difficulty with maintaining tandem stance and  SLS bilaterally placing patient at an increased risk of falls. Recommending pt discharge home with spouse with intermittent  SPV for balance, usage of SPC to improve balance, and outpatient PT. Pt will benefit from skilled PT services to improve balance to reduce fall risk and improve overall independence.      Recommendations for follow up therapy are one component of a multi-disciplinary discharge planning process, led by the attending physician.  Recommendations may be updated based on patient status, additional functional criteria and insurance authorization.  Follow Up Recommendations Outpatient PT    Assistance Recommended at Discharge Set up Supervision/Assistance  Functional Status Assessment Patient has had a recent decline in their functional status and demonstrates the ability to make significant improvements in function in a reasonable and predictable amount of time.  Electrical engineer    Recommendations for Other Services       Precautions / Restrictions Precautions Precautions: Fall Restrictions Weight Bearing Restrictions: No      Mobility  Bed Mobility Overal bed mobility: Independent             General bed mobility comments: No assistance required for sitting up at EOB    Transfers Overall transfer level: Needs assistance Equipment used: None Transfers: Sit to/from Stand Sit to Stand: Supervision           General transfer comment: Pt reaching to hold onto counter top with initial standing at EOB. No physical assistance required to complete sit to stand transfer    Ambulation/Gait Ambulation/Gait assistance: Min guard Gait Distance (Feet): 350 Feet Assistive device: None Gait Pattern/deviations: Step-through pattern;Scissoring;Drifts right/left     General Gait Details: Pt demonstrating multiple bouts of crossing his feet during ambulation requiring CGA for maintaining balance. Pt ambulates with R foot slightly internally rotated. Pt cued to decrease crossing of his feet and to decrease gait speed to improve balance  Stairs            Wheelchair Mobility    Modified Rankin (Stroke Patients Only)       Balance Overall balance assessment: Needs assistance Sitting-balance support: No upper extremity supported;Feet supported Sitting balance-Leahy Scale: Normal     Standing balance support: No upper extremity supported;During functional activity Standing balance-Leahy Scale: Fair Standing balance comment: Good static standing balance; Fair dynamic balance during ambulation Single Leg Stance - Right Leg: 3 Single Leg Stance - Left Leg: 3 Tandem Stance - Right Leg: 1 Tandem Stance - Left Leg: 1       High Level Balance Comments: Pt unable to achieve tandem stance  and withstand the position without UE support or minA for correction of balance             Pertinent Vitals/Pain      Home Living Family/patient  expects to be discharged to:: Private residence Living Arrangements: Spouse/significant other Available Help at Discharge: Family;Available 24 hours/day Type of Home: Apartment Home Access: Level entry       Home Layout: One level Home Equipment: Shower seat - built in Additional Comments: Pt works as a Aeronautical engineer for work. Pt driving prior to admission    Prior Function Prior Level of Function : Independent/Modified Independent;Working/employed;Driving             Mobility Comments: Independent with all mobility without any usage of assistive devices ADLs Comments: No assistance needed prior to admission     Hand Dominance        Extremity/Trunk Assessment        Lower Extremity Assessment Lower Extremity Assessment: Overall WFL for tasks assessed       Communication   Communication: No difficulties  Cognition Arousal/Alertness: Awake/alert Behavior During Therapy: WFL for tasks assessed/performed Overall Cognitive Status: Within Functional Limits for tasks assessed                                          General Comments      Exercises     Assessment/Plan    PT Assessment Patient needs continued PT services  PT Problem List Decreased balance;Decreased knowledge of use of DME;Decreased safety awareness       PT Treatment Interventions DME instruction;Gait training;Stair training;Functional mobility training;Therapeutic activities;Therapeutic exercise;Balance training;Neuromuscular re-education;Patient/family education    PT Goals (Current goals can be found in the Care Plan section)  Acute Rehab PT Goals Patient Stated Goal: to get back to work and stop drinking PT Goal Formulation: With patient/family Time For Goal Achievement: 06/08/21 Potential to Achieve Goals: Fair    Frequency Min 2X/week   Barriers to discharge        Co-evaluation               AM-PAC PT "6 Clicks" Mobility   Outcome Measure Help needed turning from your back to your side while in a flat bed without using bedrails?: None Help needed moving from lying on your back to sitting on the side of a flat bed without using bedrails?: None Help needed moving to and from a bed to a chair (including a wheelchair)?: A Little Help needed standing up from a chair using your arms (e.g., wheelchair or bedside chair)?: None Help needed to walk in hospital room?: A Little Help needed climbing 3-5 steps with a railing? : A Little 6 Click Score: 21    End of Session Equipment Utilized During Treatment: Gait belt Activity Tolerance: Patient tolerated treatment well Patient left: in bed;with call bell/phone within reach;with bed alarm set;with family/visitor present Nurse Communication: Mobility status PT Visit Diagnosis: Unsteadiness on feet (R26.81);Other abnormalities of gait and mobility (R26.89)    Time: 3299-2426 PT Time Calculation (min) (ACUTE ONLY): 21 min   Charges:   PT Evaluation $PT Eval Low Complexity: 1 Low PT Treatments $Gait Training: 8-22 mins        Verl Blalock, SPT   Verl Blalock 05/25/2021, 2:13 PM

## 2021-05-25 NOTE — TOC Progression Note (Signed)
Transition of Care [facility]) - Progression Note    Patient Details  Name: Justin Moore MRN: [id number] Date of Birth: 31-Oct-1965  [facility] CM/SW Contact  Caryn Section, RN Phone Number: 05/25/2021, 10:54 AM  Clinical Narrative:   Patient is discharged today, IVC cancelled as per MD.  [facility] provided Substance use assistance resources to patient, who graciously accepted.  Patient has no PCP, RNCM provided PCP resources, including open door application, patient again graciously accepted, states he will look into these resources.  Patient has no concerns about returning home at this time, states he is starting a new job and anticipates medical insurance and ability to establish PCP there.      Expected Discharge Plan: Home/Self Care Barriers to Discharge: Continued Medical Work up, Inadequate or no insurance  Expected Discharge Plan and Services Expected Discharge Plan: Home/Self Care In-house Referral: Clinical Social Work   Post Acute Care Choice: Home Health Living arrangements for the past 2 months: Single Family Home Expected Discharge Date: 05/25/21                                     Social Determinants of Health (SDOH) Interventions    Readmission Risk Interventions No flowsheet data found.

## 2021-05-25 NOTE — Progress Notes (Signed)
Patient alert and oriented, no complaints of pain, scheduled ativan given and PRN ativan given x1. Spouse in room all shift. Patient and spouse refuses bed alarm, educated about safety, will continue to monitor.

## 2021-05-25 NOTE — Discharge Summary (Signed)
Physician Discharge Summary  Justin Moore ION:[id number] DOB: 01-22-1966 DOA: 05/20/2021  PCP: Margo Common, PA-C (Inactive)  Admit date: 05/20/2021 Discharge date: 05/25/2021  Admitted From: Home Disposition: Home  Recommendations for Outpatient Follow-up:  Follow up with PCP in 1-2 weeks; needs further monitoring of blood pressure Restarted on amlodipine 10 mg p.o. daily, chlorthalidone 25 mg p.o. daily Started on losartan 100 mg p.o. daily, hydralazine 100 mg p.o. 3 times daily Please obtain CMP/CBC to assess renal function, LFTs, platelet level Would benefit from outpatient substance abuse counseling for severe alcohol abuse  Home Health: Outpatient PT Equipment/Devices: Single-point cane  Discharge Condition: Stable CODE STATUS: Full code Diet recommendation: Heart healthy diet  History of present illness:  Justin Moore is a 55 year old male with past medical history significant for essential hypertension, hyperlipidemia, EtOH use disorder, and medical noncompliance who presented to [facility] ED on 10/27 via EMS with tremors, confusion.  Also with nausea, fatigue, decreased appetite.  Patient normally drinks 1/5 of vodka daily, has not had a drink in 2-3 days.  Denied fever/chills, no recent illnesses.  No sick contacts.  Denies chest pain or shortness of breath.   In the ED, temperature 98.4 F, HR 122, RR 18, BP 224/132, SPO2 97% on room air.  137, potassium 2.9, chloride 94, CO2 19, BUN 10, creatinine 1.33, glucose 146, anion gap 24, phosphorus 3.4, magnesium 1.7, AST 198, ALT 148, total bilirubin 1.8.  Lactic acid 2.9.  WBC 8.7.  Platelets 92.  Salicylate level less than 7.0.  EtOH level less than 10.  Beta hydroxybutyrate acid 1.53.  UDS negative.  Influenza A/B PCR negative.  COVID-19 PCR negative.  CT head without contrast with no acute intracranial findings, atrophy.  Chest x-ray with no acute cardiopulmonary disease process.  Was noted to have a CIWA score initially  greater than 20.  Patient was given IV Ativan.  Duration consulted for further evaluation and management for acute EtOH withdrawal.  Hospital course:  Acute metabolic encephalopathy, POA Patient presenting to the ED via EMS after being found with tremors, intermittent confusion secondary to recent cessation from heavy alcohol use over the last 2-3 days.  Patient is afebrile.  Chest x-ray and CT head without contrast unrevealing.  EtOH level less than 10.  Salicylate level less than 7.0.  Etiology likely secondary to active withdrawal.  Patient was transferred to the ICU and started on Precedex drip which has now been weaned off.  Patient was then started on a Ativan/clonidine taper which he completed.  Discussed patient needs complete cessation from alcohol use in the future.   Severe EtOH withdrawal Patient presenting to ED via EMS after recent abrupt cessation of heavy alcohol use.  EtOH level less than 10 on admission.  Reportedly drinks fifth of vodka daily.  Was noted to be tremulous, diaphoretic, with tachycardia, hypertensive with confusion; CIWA score initially greater than 20 on ED presentation.  Patient continued to be combative, threatening to leave AMA and was transferred to the ICU and initiated on Precedex drip, now weaned off.  Patient continued Ativan/Colonie taper.  Discussed with patient needs complete cessation from alcohol use in the future.  May benefit from outpatient subs abuse counseling.   Hypertensive urgency/emergency On ED presentation, BP 224/132; etiology likely combination of medication noncompliance with antihypertensives as well as acute alcohol withdrawal as above.  Restarted home amlodipine 10 mg p.o. daily, chlorthalidone 25 mg p.o. daily.  Started on losartan 100 mg p.o. daily, hydralazine 100 mg  p.o. 3 times daily.  Will need further follow-up and monitoring by PCP outpatient.   Anion gap metabolic acidosis Alcohol ketoacidosis Lactic acidosis: resolved On  admission, anion gap 24, lactic acid 2.4, beta hydroxybutyrate acid 1.53, AST 198, ALT 148.  Likely secondary to chronic alcohol abuse.  Anion gap and lactic acid resolved with IV fluid hydration.  Repeat CMP 1 week.   Alcoholic hepatitis Likely secondary to chronic alcohol abuse.  Alcohol cessation.  Repeat CMP 1 week.   Thrombocytopenia Platelets 92, likely secondary to chronic alcohol abuse.  CBC 1 week.   Hypokalemia Hypomagnesemia Potassium 2.9 on admission, likely secondary to poor oral intake in the setting of EtOH abuse.  Repleted during hospitalization.  Potassium 3.8 at time of discharge.  Discharge Diagnoses:  Active Problems:   Essential hypertension   Alcoholic hepatitis without ascites   Alcohol abuse    Discharge Instructions  Discharge Instructions     Call MD for:  difficulty breathing, headache or visual disturbances   Complete by: As directed    Call MD for:  extreme fatigue   Complete by: As directed    Call MD for:  persistant dizziness or light-headedness   Complete by: As directed    Call MD for:  persistant nausea and vomiting   Complete by: As directed    Call MD for:  severe uncontrolled pain   Complete by: As directed    Call MD for:  temperature >100.4   Complete by: As directed    Diet - low sodium heart healthy   Complete by: As directed    Increase activity slowly   Complete by: As directed       Allergies as of 05/25/2021   No Known Allergies      Medication List     STOP taking these medications    BC HEADACHE POWDER PO   diclofenac 75 MG EC tablet Commonly known as: VOLTAREN       TAKE these medications    amLODipine 10 MG tablet Commonly known as: NORVASC Take 1 tablet (10 mg total) by mouth daily.   Blood Pressure Monitor Kit 1 kit by Does not apply route daily.   chlorthalidone 25 MG tablet Commonly known as: HYGROTON Take 1 tablet (25 mg total) by mouth daily.   hydrALAZINE 100 MG tablet Commonly known as:  APRESOLINE Take 1 tablet (100 mg total) by mouth every 8 (eight) hours.   losartan 100 MG tablet Commonly known as: COZAAR Take 1 tablet (100 mg total) by mouth daily. Start taking on: May 26, 2021   MELATONIN ER PO Take by mouth.               Durable Medical Equipment  (From admission, onward)           Start     Ordered   05/25/21 1012  For home use only DME Cane  Once        05/25/21 1012            Follow-up Information     Chrismon, Vickki Muff, PA-C. Schedule an appointment as soon as possible for a visit in 1 week(s).   Specialty: Family Medicine Contact information: [address] [place] [address] [phone]                No Known Allergies  Consultations: PCCM   Procedures/Studies: CT HEAD WO CONTRAST (5MM)  Result Date: 05/20/2021 CLINICAL DATA:  Seizures EXAM: CT HEAD WITHOUT CONTRAST TECHNIQUE:  Contiguous axial images were obtained from the base of the skull through the vertex without intravenous contrast. COMPARISON:  None. FINDINGS: Brain: There are no signs of recent bleeding within the cranium. There is no focal mass effect. Ventricles are not dilated. Cortical sulci are prominent. There is decreased density in periventricular white matter. Vascular: Unremarkable. Skull: Unremarkable Sinuses/Orbits: There is mild mucosal thickening in the ethmoid sinus Other: None IMPRESSION: No acute intracranial findings are seen in noncontrast CT brain. Atrophy. Reading location: [place], [place]. Electronically Signed   By: Elmer Picker M.D.   On: 05/20/2021 13:26   DG Chest Port 1 View  Result Date: 05/20/2021 CLINICAL DATA:  Hypertension, seizure EXAM: PORTABLE CHEST 1 VIEW COMPARISON:  07/26/2017 FINDINGS: The heart size and mediastinal contours are within normal limits. No focal airspace consolidation, pleural effusion, or pneumothorax. The visualized skeletal structures are unremarkable. IMPRESSION: No active disease.  Electronically Signed   By: Davina Poke D.O.   On: 05/20/2021 18:29     Subjective: Patient seen examined bedside, resting comfortably.  Blood pressure improved.  No further tremors.  Work with therapy this morning with recommendations of outpatient PT and cane.  Ready for discharge home.  Discussed with him once again needs complete cessation from alcohol use; patient acknowledges.  No other questions or concerns at this time.  Denies headache, no visual changes, no chest pain, no palpitations, no shortness of breath, no abdominal pain, no weakness, no fatigue, no fever/chills/night sweats, no nausea/vomiting/diarrhea, no paresthesias.  No acute events overnight per nurse staff.  Discharge Exam: Vitals:   05/25/21 0547 05/25/21 0738  BP: (!) 159/98 (!) 154/108  Pulse: 89 89  Resp: 20 16  Temp: 98 F (36.7 C) 98.5 F (36.9 C)  SpO2: 96% 99%   Vitals:   05/24/21 1938 05/24/21 2335 05/25/21 0547 05/25/21 0738  BP: (!) 150/95 (!) 164/101 (!) 159/98 (!) 154/108  Pulse: 81 89 89 89  Resp: 16  20 16   Temp: 98.1 F (36.7 C) 98.3 F (36.8 C) 98 F (36.7 C) 98.5 F (36.9 C)  TempSrc: Oral Oral Oral Oral  SpO2: 100% 97% 96% 99%  Weight:      Height:        General: Pt is alert, awake, not in acute distress Cardiovascular: RRR, S1/S2 +, no rubs, no gallops Respiratory: CTA bilaterally, no wheezing, no rhonchi, on room air Abdominal: Soft, NT, ND, bowel sounds + Extremities: no edema, no cyanosis    The results of significant diagnostics from this hospitalization (including imaging, microbiology, ancillary and laboratory) are listed below for reference.     Microbiology: Recent Results (from the past 240 hour(s))  Resp Panel by RT-PCR (Flu A&B, Covid) Nasopharyngeal Swab     Status: None   Collection Time: 05/20/21  2:00 PM   Specimen: Nasopharyngeal Swab; Nasopharyngeal(NP) swabs in vial transport medium  Result Value Ref Range Status   SARS Coronavirus 2 by RT PCR  NEGATIVE NEGATIVE Final    Comment: (NOTE) SARS-CoV-2 target nucleic acids are NOT DETECTED.  The SARS-CoV-2 RNA is generally detectable in upper respiratory specimens during the acute phase of infection. The lowest concentration of SARS-CoV-2 viral copies this assay can detect is 138 copies/mL. A negative result does not preclude SARS-Cov-2 infection and should not be used as the sole basis for treatment or other patient management decisions. A negative result may occur with  improper specimen collection/handling, submission of specimen other than nasopharyngeal swab, presence of viral mutation(s) within the  areas targeted by this assay, and inadequate number of viral copies(<138 copies/mL). A negative result must be combined with clinical observations, patient history, and epidemiological information. The expected result is Negative.  Fact Sheet for Patients:  EntrepreneurPulse.com.au  Fact Sheet for Healthcare Providers:  IncredibleEmployment.be  This test is no t yet approved or cleared by the [place] FDA and  has been authorized for detection and/or diagnosis of SARS-CoV-2 by FDA under an Emergency Use Authorization (EUA). This EUA will remain  in effect (meaning this test can be used) for the duration of the COVID-19 declaration under Section 564(b)(1) of the Act, 21 U.S.C.section 360bbb-3(b)(1), unless the authorization is terminated  or revoked sooner.       Influenza A by PCR NEGATIVE NEGATIVE Final   Influenza B by PCR NEGATIVE NEGATIVE Final    Comment: (NOTE) The Xpert Xpress SARS-CoV-2/FLU/RSV plus assay is intended as an aid in the diagnosis of influenza from Nasopharyngeal swab specimens and should not be used as a sole basis for treatment. Nasal washings and aspirates are unacceptable for Xpert Xpress SARS-CoV-2/FLU/RSV testing.  Fact Sheet for Patients: EntrepreneurPulse.com.au  Fact Sheet for  Healthcare Providers: IncredibleEmployment.be  This test is not yet approved or cleared by the [place] FDA and has been authorized for detection and/or diagnosis of SARS-CoV-2 by FDA under an Emergency Use Authorization (EUA). This EUA will remain in effect (meaning this test can be used) for the duration of the COVID-19 declaration under Section 564(b)(1) of the Act, 21 U.S.C. section 360bbb-3(b)(1), unless the authorization is terminated or revoked.  Performed at [facility], [address] [place], [place] [address]   MRSA Next Gen by PCR, Nasal     Status: None   Collection Time: 05/22/21  4:41 PM   Specimen: Nasal Mucosa; Nasal Swab  Result Value Ref Range Status   MRSA by PCR Next Gen NOT DETECTED NOT DETECTED Final    Comment: (NOTE) The GeneXpert MRSA Assay (FDA approved for NASAL specimens only), is one component of a comprehensive MRSA colonization surveillance program. It is not intended to diagnose MRSA infection nor to guide or monitor treatment for MRSA infections. Test performance is not FDA approved in patients less than 68 years old. Performed at [facility], [address] [place], [place] [address]      Labs: BNP (last 3 results) No results for input(s): BNP in the last 8760 hours. Basic Metabolic Panel: Recent Labs  Lab 05/20/21 1211 05/20/21 2008 05/21/21 0800 05/22/21 0515 05/22/21 1959 05/23/21 0435 05/24/21 0548 05/25/21 0542  NA 137   < > 133* 131*  --  134* 132* 131*  K 2.9*   < > 4.0 3.2* 3.3* 3.6 3.2* 3.8  CL 94*   < > 98 95*  --  100 97* 99  CO2 19*   < > 21* 21*  --  22 25 24   GLUCOSE 146*   < > 89 105*  --  109* 112* 114*  BUN 10   < > 9 13  --  19 17 24*  CREATININE 1.33*   < > 0.88 1.00  --  1.05 1.06 1.19  CALCIUM 9.5   < > 9.1 10.2  --  9.5 10.2 10.5*  MG 1.7  --   --  1.6* 2.5* 2.0 1.7 1.7  PHOS 3.4  --   --   --  5.1* 4.3  --   --    < > = values in this interval  not displayed.    Liver Function Tests: Recent Labs  Lab 05/21/21 0800 05/22/21 0515 05/23/21 0435 05/24/21 0548 05/25/21 0542  AST 112* 111* 80* 99* 196*  ALT 103* 99* 84* 101* 180*  ALKPHOS 62 70 58 65 80  BILITOT 2.3* 2.1* 1.8* 1.4* 1.4*  PROT 7.4 7.6 6.9 7.1 7.2  ALBUMIN 3.7 3.9 3.3* 3.7 3.8   No results for input(s): LIPASE, AMYLASE in the last 168 hours. No results for input(s): AMMONIA in the last 168 hours. CBC: Recent Labs  Lab 05/20/21 1211 05/21/21 0707 05/22/21 0515 05/23/21 0435 05/25/21 0542  WBC 8.7 8.1 10.2 8.1 7.2  HGB 15.3 14.8 15.3 13.6 14.3  HCT RESULTS UNAVAILABLE DUE TO INTERFERING SUBSTANCE RESULTS UNAVAILABLE DUE TO INTERFERING SUBSTANCE RESULTS UNAVAILABLE DUE TO INTERFERING SUBSTANCE 35.8* 38.2*  MCV RESULTS UNAVAILABLE DUE TO INTERFERING SUBSTANCE RESULTS UNAVAILABLE DUE TO INTERFERING SUBSTANCE RESULTS UNAVAILABLE DUE TO INTERFERING SUBSTANCE 89.7 90.7  PLT 92* 83* 89* 88* 155   Cardiac Enzymes: Recent Labs  Lab 05/20/21 2008  CKTOTAL 641*   BNP: Invalid input(s): POCBNP CBG: Recent Labs  Lab 05/21/21 1434 05/22/21 0645 05/22/21 1630 05/22/21 1952 05/23/21 0043  GLUCAP 101* 125* 100* 110* 96   D-Dimer No results for input(s): DDIMER in the last 72 hours. Hgb A1c No results for input(s): HGBA1C in the last 72 hours. Lipid Profile No results for input(s): CHOL, HDL, LDLCALC, TRIG, CHOLHDL, LDLDIRECT in the last 72 hours. Thyroid function studies No results for input(s): TSH, T4TOTAL, T3FREE, THYROIDAB in the last 72 hours.  Invalid input(s): FREET3 Anemia work up No results for input(s): VITAMINB12, FOLATE, FERRITIN, TIBC, IRON, RETICCTPCT in the last 72 hours. Urinalysis    Component Value Date/Time   APPEARANCEUR Clear 12/04/2019 1212   LABSPEC 1.025 08/25/2017 1010   PHURINE 6.0 08/25/2017 1010   GLUCOSEU Negative 12/04/2019 1212   HGBUR NEGATIVE 08/25/2017 1010   BILIRUBINUR Negative 05/25/2020 1124   BILIRUBINUR Negative  12/04/2019 1212   KETONESUR TRACE (A) 08/25/2017 1010   PROTEINUR Positive (A) 05/25/2020 1124   PROTEINUR Negative 12/04/2019 1212   PROTEINUR 100 (A) 08/25/2017 1010   UROBILINOGEN 0.2 05/25/2020 1124   UROBILINOGEN 2.0 (H) 08/25/2017 1010   NITRITE Negative 05/25/2020 1124   NITRITE Negative 12/04/2019 1212   NITRITE NEGATIVE 08/25/2017 1010   LEUKOCYTESUR Negative 05/25/2020 1124   LEUKOCYTESUR Negative 12/04/2019 1212   Sepsis Labs Invalid input(s): PROCALCITONIN,  WBC,  LACTICIDVEN Microbiology Recent Results (from the past 240 hour(s))  Resp Panel by RT-PCR (Flu A&B, Covid) Nasopharyngeal Swab     Status: None   Collection Time: 05/20/21  2:00 PM   Specimen: Nasopharyngeal Swab; Nasopharyngeal(NP) swabs in vial transport medium  Result Value Ref Range Status   SARS Coronavirus 2 by RT PCR NEGATIVE NEGATIVE Final    Comment: (NOTE) SARS-CoV-2 target nucleic acids are NOT DETECTED.  The SARS-CoV-2 RNA is generally detectable in upper respiratory specimens during the acute phase of infection. The lowest concentration of SARS-CoV-2 viral copies this assay can detect is 138 copies/mL. A negative result does not preclude SARS-Cov-2 infection and should not be used as the sole basis for treatment or other patient management decisions. A negative result may occur with  improper specimen collection/handling, submission of specimen other than nasopharyngeal swab, presence of viral mutation(s) within the areas targeted by this assay, and inadequate number of viral copies(<138 copies/mL). A negative result must be combined with clinical observations, patient history, and epidemiological information. The expected result is Negative.  Fact Sheet for Patients:  EntrepreneurPulse.com.au  Fact Sheet for Healthcare Providers:  IncredibleEmployment.be  This test is no t yet approved or cleared by the [place] FDA and  has been authorized for  detection and/or diagnosis of SARS-CoV-2 by FDA under an Emergency Use Authorization (EUA). This EUA will remain  in effect (meaning this test can be used) for the duration of the COVID-19 declaration under Section 564(b)(1) of the Act, 21 U.S.C.section 360bbb-3(b)(1), unless the authorization is terminated  or revoked sooner.       Influenza A by PCR NEGATIVE NEGATIVE Final   Influenza B by PCR NEGATIVE NEGATIVE Final    Comment: (NOTE) The Xpert Xpress SARS-CoV-2/FLU/RSV plus assay is intended as an aid in the diagnosis of influenza from Nasopharyngeal swab specimens and should not be used as a sole basis for treatment. Nasal washings and aspirates are unacceptable for Xpert Xpress SARS-CoV-2/FLU/RSV testing.  Fact Sheet for Patients: EntrepreneurPulse.com.au  Fact Sheet for Healthcare Providers: IncredibleEmployment.be  This test is not yet approved or cleared by the [place] FDA and has been authorized for detection and/or diagnosis of SARS-CoV-2 by FDA under an Emergency Use Authorization (EUA). This EUA will remain in effect (meaning this test can be used) for the duration of the COVID-19 declaration under Section 564(b)(1) of the Act, 21 U.S.C. section 360bbb-3(b)(1), unless the authorization is terminated or revoked.  Performed at [facility], [address] [place], [place] [address]   MRSA Next Gen by PCR, Nasal     Status: None   Collection Time: 05/22/21  4:41 PM   Specimen: Nasal Mucosa; Nasal Swab  Result Value Ref Range Status   MRSA by PCR Next Gen NOT DETECTED NOT DETECTED Final    Comment: (NOTE) The GeneXpert MRSA Assay (FDA approved for NASAL specimens only), is one component of a comprehensive MRSA colonization surveillance program. It is not intended to diagnose MRSA infection nor to guide or monitor treatment for MRSA infections. Test performance is not FDA approved in patients less than 28  years old. Performed at [facility], [address] [place], [place] [address]      Time coordinating discharge: Over 30 minutes  SIGNED:   Lillian Ballester J [place] DO  [facility] 05/25/2021, 10:26 AM

## 2021-12-25 ENCOUNTER — Encounter: Payer: Self-pay | Admitting: Emergency Medicine

## 2021-12-25 ENCOUNTER — Inpatient Hospital Stay
Admission: EM | Admit: 2021-12-25 | Discharge: 2021-12-30 | DRG: 897 | Disposition: A | Discharge status: Home / Self Care | Payer: Self-pay | Attending: Hospitalist | Admitting: Hospitalist

## 2021-12-25 ENCOUNTER — Other Ambulatory Visit: Payer: Self-pay

## 2021-12-25 DIAGNOSIS — F1090 Alcohol use, unspecified, uncomplicated: Secondary | ICD-10-CM | POA: Diagnosis present

## 2021-12-25 DIAGNOSIS — F1010 Alcohol abuse, uncomplicated: Principal | ICD-10-CM

## 2021-12-25 DIAGNOSIS — E876 Hypokalemia: Secondary | ICD-10-CM

## 2021-12-25 DIAGNOSIS — I161 Hypertensive emergency: Secondary | ICD-10-CM

## 2021-12-25 DIAGNOSIS — F17200 Nicotine dependence, unspecified, uncomplicated: Secondary | ICD-10-CM | POA: Diagnosis present

## 2021-12-25 DIAGNOSIS — D72829 Elevated white blood cell count, unspecified: Secondary | ICD-10-CM | POA: Diagnosis present

## 2021-12-25 DIAGNOSIS — F10231 Alcohol dependence with withdrawal delirium: Principal | ICD-10-CM | POA: Diagnosis present

## 2021-12-25 DIAGNOSIS — Z6831 Body mass index (BMI) 31.0-31.9, adult: Secondary | ICD-10-CM

## 2021-12-25 DIAGNOSIS — D6959 Other secondary thrombocytopenia: Secondary | ICD-10-CM | POA: Diagnosis present

## 2021-12-25 DIAGNOSIS — K5900 Constipation, unspecified: Secondary | ICD-10-CM | POA: Diagnosis not present

## 2021-12-25 DIAGNOSIS — IMO0001 Reserved for inherently not codable concepts without codable children: Secondary | ICD-10-CM | POA: Diagnosis present

## 2021-12-25 DIAGNOSIS — K7010 Alcoholic hepatitis without ascites: Secondary | ICD-10-CM | POA: Diagnosis present

## 2021-12-25 DIAGNOSIS — Z79899 Other long term (current) drug therapy: Secondary | ICD-10-CM

## 2021-12-25 DIAGNOSIS — Y906 Blood alcohol level of 120-199 mg/100 ml: Secondary | ICD-10-CM | POA: Diagnosis present

## 2021-12-25 DIAGNOSIS — I10 Essential (primary) hypertension: Secondary | ICD-10-CM | POA: Diagnosis present

## 2021-12-25 DIAGNOSIS — F17210 Nicotine dependence, cigarettes, uncomplicated: Secondary | ICD-10-CM | POA: Diagnosis present

## 2021-12-25 DIAGNOSIS — E669 Obesity, unspecified: Secondary | ICD-10-CM | POA: Diagnosis present

## 2021-12-25 DIAGNOSIS — F10931 Alcohol use, unspecified with withdrawal delirium: Secondary | ICD-10-CM | POA: Diagnosis present

## 2021-12-25 DIAGNOSIS — F10939 Alcohol use, unspecified with withdrawal, unspecified: Secondary | ICD-10-CM | POA: Diagnosis present

## 2021-12-25 DIAGNOSIS — D696 Thrombocytopenia, unspecified: Secondary | ICD-10-CM | POA: Diagnosis present

## 2021-12-25 DIAGNOSIS — Z8249 Family history of ischemic heart disease and other diseases of the circulatory system: Secondary | ICD-10-CM

## 2021-12-25 DIAGNOSIS — Z91199 Patient's noncompliance with other medical treatment and regimen due to unspecified reason: Secondary | ICD-10-CM

## 2021-12-25 LAB — URINE DRUG SCREEN, QUALITATIVE (ARMC ONLY)
Amphetamines, Ur Screen: NOT DETECTED
Barbiturates, Ur Screen: NOT DETECTED
Benzodiazepine, Ur Scrn: NOT DETECTED
Cannabinoid 50 Ng, Ur ~~LOC~~: POSITIVE — AB
Cocaine Metabolite,Ur ~~LOC~~: NOT DETECTED
MDMA (Ecstasy)Ur Screen: NOT DETECTED
Methadone Scn, Ur: NOT DETECTED
Opiate, Ur Screen: NOT DETECTED
Phencyclidine (PCP) Ur S: NOT DETECTED
Tricyclic, Ur Screen: NOT DETECTED

## 2021-12-25 LAB — CBC
HCT: 39.4 % (ref 39.0–52.0)
Hemoglobin: 14.6 g/dL (ref 13.0–17.0)
MCH: 33.2 pg (ref 26.0–34.0)
MCHC: 37.1 g/dL — ABNORMAL HIGH (ref 30.0–36.0)
MCV: 89.5 fL (ref 80.0–100.0)
Platelets: 85 K/uL — ABNORMAL LOW (ref 150–400)
RBC: 4.4 MIL/uL (ref 4.22–5.81)
RDW: 12.9 % (ref 11.5–15.5)
WBC: 5.5 K/uL (ref 4.0–10.5)
nRBC: 0 % (ref 0.0–0.2)

## 2021-12-25 LAB — COMPREHENSIVE METABOLIC PANEL
ALT: 191 U/L — ABNORMAL HIGH (ref 0–44)
AST: 256 U/L — ABNORMAL HIGH (ref 15–41)
Albumin: 4.6 g/dL (ref 3.5–5.0)
Alkaline Phosphatase: 83 U/L (ref 38–126)
Anion gap: 18 — ABNORMAL HIGH (ref 5–15)
BUN: 14 mg/dL (ref 6–20)
CO2: 23 mmol/L (ref 22–32)
Calcium: 9.6 mg/dL (ref 8.9–10.3)
Chloride: 97 mmol/L — ABNORMAL LOW (ref 98–111)
Creatinine, Ser: 0.96 mg/dL (ref 0.61–1.24)
GFR, Estimated: >60 mL/min (ref >60)
Glucose, Bld: 146 mg/dL — ABNORMAL HIGH (ref 70–99)
Potassium: 3.1 mmol/L — ABNORMAL LOW (ref 3.5–5.1)
Sodium: 138 mmol/L (ref 135–145)
Total Bilirubin: 1.7 mg/dL — ABNORMAL HIGH (ref 0.3–1.2)
Total Protein: 8.3 g/dL — ABNORMAL HIGH (ref 6.5–8.1)

## 2021-12-25 LAB — ETHANOL: Alcohol, Ethyl (B): 184 mg/dL — ABNORMAL HIGH (ref <10)

## 2021-12-25 MED ORDER — LORAZEPAM 2 MG/ML IJ SOLN
2.0000 mg | Freq: Once | INTRAMUSCULAR | Status: AC
Start: 2021-12-25 — End: 2021-12-25
  Administered 2021-12-25: 2 mg via INTRAVENOUS
  Filled 2021-12-25: qty 1

## 2021-12-25 MED ORDER — LORAZEPAM 2 MG/ML IJ SOLN
2.0000 mg | Freq: Once | INTRAMUSCULAR | Status: AC
  Administered 2021-12-25: 2 mg via INTRAVENOUS
  Filled 2021-12-25: qty 1

## 2021-12-25 MED ORDER — SODIUM CHLORIDE 0.9 % IV BOLUS
1000.0000 mL | Freq: Once | INTRAVENOUS | Status: AC
  Administered 2021-12-25: 1000 mL via INTRAVENOUS

## 2021-12-25 NOTE — ED Provider Notes (Addendum)
[facility] Provider Note    Event Date/Time   First MD Initiated Contact with Patient 12/25/21 1814     (approximate)   History   Detox   HPI  Justin Moore is a 56 y.o. male  who presents to the emergency department today because of desire for alcohol detox.  He states he drinks about 1/5 a day.  Last drink yesterday.  He would like to detox.  He does state he is having some tremors.  Physical Exam   Triage Vital Signs: ED Triage Vitals  Enc Vitals Group     BP 12/25/21 1557 (!) 181/116     Pulse Rate 12/25/21 1557 (!) 106     Resp 12/25/21 1557 18     Temp 12/25/21 1557 99.3 F (37.4 C)     Temp Source 12/25/21 1557 Oral     SpO2 12/25/21 1557 96 %     Weight 12/25/21 1601 210 lb (95.3 kg)     Height 12/25/21 1601 5\' 8"  (1.727 m)     Head Circumference --      Peak Flow --      Pain Score 12/25/21 1600 10   Most recent vital signs: Vitals:   12/25/21 1557  BP: (!) 181/116  Pulse: (!) 106  Resp: 18  Temp: 99.3 F (37.4 C)  SpO2: 96%    General: Awake, alert, oriented. CV:  Good peripheral perfusion. Tachycardia. Resp:  Normal effort. Lungs clear. Abd:  No distention.  Other:  Slight tremors. Not responding to internal stimuli.   ED Results / Procedures / Treatments   Labs (all labs ordered are listed, but only abnormal results are displayed) Labs Reviewed  COMPREHENSIVE METABOLIC PANEL - Abnormal; Notable for the following components:      Result Value   Potassium 3.1 (*)    Chloride 97 (*)    Glucose, Bld 146 (*)    Total Protein 8.3 (*)    AST 256 (*)    ALT 191 (*)    Total Bilirubin 1.7 (*)    Anion gap 18 (*)    All other components within normal limits  ETHANOL - Abnormal; Notable for the following components:   Alcohol, Ethyl (B) 184 (*)    All other components within normal limits  CBC - Abnormal; Notable for the following components:   MCHC 37.1 (*)    Platelets 85 (*)    All other components within  normal limits  URINE DRUG SCREEN, QUALITATIVE ([facility] ONLY) - Abnormal; Notable for the following components:   Cannabinoid 50 Ng, Ur Bennett POSITIVE (*)    All other components within normal limits     EKG  None   RADIOLOGY None    PROCEDURES:  Critical Care performed: No  Procedures   MEDICATIONS ORDERED IN ED: Medications - No data to display   IMPRESSION / MDM / ASSESSMENT AND PLAN / ED COURSE  I reviewed the triage vital signs and the nursing notes.                              Differential diagnosis includes, but is not limited to, alcohol withdrawal, other drug use.  Patient's presentation is most consistent with acute presentation with potential threat to life or bodily function.  Patient presented to the emergency department today with desire for alcohol detox.  Patient states last drink was yesterday.  Patient's ethanol level however still  quite elevated today.  Patient does have some tremors in his blood pressure and heart rate are slightly elevated.  Because of this patient will be given IV Ativan and CIWA will be initiated.  We will have TTS evaluate for possible detox placement.  At this time patient without evidence of any hallucinations or delirium.   TTS unable to find detox bed for patient. Discussed with Dr. Mikeal Hawthorne who will plan on admission.  FINAL CLINICAL IMPRESSION(S) / ED DIAGNOSES   Final diagnoses:  Alcohol abuse     Note:  This document was prepared using Dragon voice recognition software and may include unintentional dictation errors.    Phineas Semen, MD 12/25/21 2250    Phineas Semen, MD 12/26/21 [phone]

## 2021-12-25 NOTE — ED Triage Notes (Addendum)
Pt via POV from home. Pt wanting detox off of alcohol. Pt is everyday drinker, last drink was last night where he drank a 5th of alcohol and that is normally how much he drinks a day. States hes been on a binge the past week. Pt states this feels like the last time he was in alcohol withdrawal. Denies any other substance use. Denies SI/HI. Pt c/o tremors, nausea, sweating. Pt is A&OX4 but has severe tremors.

## 2021-12-25 NOTE — BH Assessment (Incomplete)
TTS has contacted [facility]. Representative has stated that they have no available detox beds, with no anticipated discharges until Monday

## 2021-12-25 NOTE — ED Provider Notes (Incomplete Revision)
[facility] Provider Note    Event Date/Time   First MD Initiated Contact with Patient 12/25/21 1814     (approximate)   History   Detox   HPI  Justin Moore is a 56 y.o. male  who presents to the emergency department today because of desire for alcohol detox.  He states he drinks about 1/5 a day.  Last drink yesterday.  He would like to detox.  He does state he is having some tremors.  Physical Exam   Triage Vital Signs: ED Triage Vitals  Enc Vitals Group     BP 12/25/21 1557 (!) 181/116     Pulse Rate 12/25/21 1557 (!) 106     Resp 12/25/21 1557 18     Temp 12/25/21 1557 99.3 F (37.4 C)     Temp Source 12/25/21 1557 Oral     SpO2 12/25/21 1557 96 %     Weight 12/25/21 1601 210 lb (95.3 kg)     Height 12/25/21 1601 5\' 8"  (1.727 m)     Head Circumference --      Peak Flow --      Pain Score 12/25/21 1600 10   Most recent vital signs: Vitals:   12/25/21 1557  BP: (!) 181/116  Pulse: (!) 106  Resp: 18  Temp: 99.3 F (37.4 C)  SpO2: 96%    General: Awake, alert, oriented. CV:  Good peripheral perfusion. Tachycardia. Resp:  Normal effort. Lungs clear. Abd:  No distention.  Other:  Slight tremors. Not responding to internal stimuli.   ED Results / Procedures / Treatments   Labs (all labs ordered are listed, but only abnormal results are displayed) Labs Reviewed  COMPREHENSIVE METABOLIC PANEL - Abnormal; Notable for the following components:      Result Value   Potassium 3.1 (*)    Chloride 97 (*)    Glucose, Bld 146 (*)    Total Protein 8.3 (*)    AST 256 (*)    ALT 191 (*)    Total Bilirubin 1.7 (*)    Anion gap 18 (*)    All other components within normal limits  ETHANOL - Abnormal; Notable for the following components:   Alcohol, Ethyl (B) 184 (*)    All other components within normal limits  CBC - Abnormal; Notable for the following components:   MCHC 37.1 (*)    Platelets 85 (*)    All other components within  normal limits  URINE DRUG SCREEN, QUALITATIVE ([facility] ONLY) - Abnormal; Notable for the following components:   Cannabinoid 50 Ng, Ur Bennett POSITIVE (*)    All other components within normal limits     EKG  None   RADIOLOGY None    PROCEDURES:  Critical Care performed: No  Procedures   MEDICATIONS ORDERED IN ED: Medications - No data to display   IMPRESSION / MDM / ASSESSMENT AND PLAN / ED COURSE  I reviewed the triage vital signs and the nursing notes.                              Differential diagnosis includes, but is not limited to, alcohol withdrawal, other drug use.  Patient's presentation is most consistent with acute presentation with potential threat to life or bodily function.  Patient presented to the emergency department today with desire for alcohol detox.  Patient states last drink was yesterday.  Patient's ethanol level however still  quite elevated today.  Patient does have some tremors in his blood pressure and heart rate are slightly elevated.  Because of this patient will be given IV Ativan and CIWA will be initiated.  We will have TTS evaluate for possible detox placement.  At this time patient without evidence of any hallucinations or delirium.   TTS unable to find detox bed for patient. Discussed with Dr. Marland Kitchen who will plan on admission.  FINAL CLINICAL IMPRESSION(S) / ED DIAGNOSES   Final diagnoses:  Alcohol abuse     Note:  This document was prepared using Dragon voice recognition software and may include unintentional dictation errors.    Phineas Semen, MD 12/25/21 2250

## 2021-12-25 NOTE — ED Notes (Signed)
Pt shaky, sweating.

## 2021-12-26 DIAGNOSIS — D696 Thrombocytopenia, unspecified: Secondary | ICD-10-CM | POA: Diagnosis present

## 2021-12-26 DIAGNOSIS — I161 Hypertensive emergency: Secondary | ICD-10-CM

## 2021-12-26 DIAGNOSIS — F10939 Alcohol use, unspecified with withdrawal, unspecified: Secondary | ICD-10-CM | POA: Diagnosis present

## 2021-12-26 DIAGNOSIS — I10 Essential (primary) hypertension: Secondary | ICD-10-CM

## 2021-12-26 DIAGNOSIS — K7010 Alcoholic hepatitis without ascites: Secondary | ICD-10-CM

## 2021-12-26 DIAGNOSIS — D72829 Elevated white blood cell count, unspecified: Secondary | ICD-10-CM

## 2021-12-26 DIAGNOSIS — E876 Hypokalemia: Secondary | ICD-10-CM

## 2021-12-26 DIAGNOSIS — F10931 Alcohol use, unspecified with withdrawal delirium: Secondary | ICD-10-CM

## 2021-12-26 DIAGNOSIS — F1010 Alcohol abuse, uncomplicated: Secondary | ICD-10-CM

## 2021-12-26 LAB — COMPREHENSIVE METABOLIC PANEL
ALT: 168 U/L — ABNORMAL HIGH (ref 0–44)
AST: 209 U/L — ABNORMAL HIGH (ref 15–41)
Albumin: 4.1 g/dL (ref 3.5–5.0)
Alkaline Phosphatase: 77 U/L (ref 38–126)
Anion gap: 14 (ref 5–15)
BUN: 11 mg/dL (ref 6–20)
CO2: 22 mmol/L (ref 22–32)
Calcium: 9.3 mg/dL (ref 8.9–10.3)
Chloride: 99 mmol/L (ref 98–111)
Creatinine, Ser: 0.85 mg/dL (ref 0.61–1.24)
GFR, Estimated: >60 mL/min (ref >60)
Glucose, Bld: 150 mg/dL — ABNORMAL HIGH (ref 70–99)
Potassium: 3 mmol/L — ABNORMAL LOW (ref 3.5–5.1)
Sodium: 135 mmol/L (ref 135–145)
Total Bilirubin: 2.2 mg/dL — ABNORMAL HIGH (ref 0.3–1.2)
Total Protein: 7.5 g/dL (ref 6.5–8.1)

## 2021-12-26 LAB — CBC
HCT: 39 % (ref 39.0–52.0)
HCT: 38.7 % — ABNORMAL LOW (ref 39.0–52.0)
Hemoglobin: 14.5 g/dL (ref 13.0–17.0)
Hemoglobin: 14.3 g/dL (ref 13.0–17.0)
MCH: 33.6 pg (ref 26.0–34.0)
MCH: 33.6 pg (ref 26.0–34.0)
MCHC: 37.2 g/dL — ABNORMAL HIGH (ref 30.0–36.0)
MCHC: 37 g/dL — ABNORMAL HIGH (ref 30.0–36.0)
MCV: 90.5 fL (ref 80.0–100.0)
MCV: 91.1 fL (ref 80.0–100.0)
Platelets: 74 10*3/uL — ABNORMAL LOW (ref 150–400)
Platelets: 69 10*3/uL — ABNORMAL LOW (ref 150–400)
RBC: 4.31 MIL/uL (ref 4.22–5.81)
RBC: 4.25 MIL/uL (ref 4.22–5.81)
RDW: 12.9 % (ref 11.5–15.5)
RDW: 13.1 % (ref 11.5–15.5)
WBC: 7 10*3/uL (ref 4.0–10.5)
WBC: 7.5 10*3/uL (ref 4.0–10.5)
nRBC: 0 % (ref 0.0–0.2)
nRBC: 0.3 % — ABNORMAL HIGH (ref 0.0–0.2)

## 2021-12-26 LAB — CREATININE, SERUM
Creatinine, Ser: 0.89 mg/dL (ref 0.61–1.24)
GFR, Estimated: >60 mL/min (ref >60)

## 2021-12-26 LAB — MRSA NEXT GEN BY PCR, NASAL: MRSA by PCR Next Gen: NOT DETECTED

## 2021-12-26 LAB — GLUCOSE, CAPILLARY: Glucose-Capillary: 179 mg/dL — ABNORMAL HIGH (ref 70–99)

## 2021-12-26 LAB — MAGNESIUM: Magnesium: 1.9 mg/dL (ref 1.7–2.4)

## 2021-12-26 MED ORDER — LABETALOL HCL 5 MG/ML IV SOLN
20.0000 mg | Freq: Once | INTRAVENOUS | Status: AC
  Administered 2021-12-26: 20 mg via INTRAVENOUS
  Filled 2021-12-26: qty 4

## 2021-12-26 MED ORDER — ONDANSETRON HCL 4 MG PO TABS: 4.0000 mg | ORAL_TABLET | Freq: Four times a day (QID) | ORAL | Status: DC | PRN

## 2021-12-26 MED ORDER — METOPROLOL TARTRATE 25 MG PO TABS
25.0000 mg | ORAL_TABLET | Freq: Two times a day (BID) | ORAL | Status: DC
  Administered 2021-12-26 – 2021-12-27 (×3): 25 mg via ORAL
  Filled 2021-12-26 (×3): qty 1

## 2021-12-26 MED ORDER — POTASSIUM CHLORIDE 10 MEQ/100ML IV SOLN
10.0000 meq | INTRAVENOUS | Status: AC
  Administered 2021-12-26 (×2): 10 meq via INTRAVENOUS
  Filled 2021-12-26: qty 100

## 2021-12-26 MED ORDER — LABETALOL HCL 5 MG/ML IV SOLN
20.0000 mg | Freq: Once | INTRAVENOUS | Status: AC
Start: 2021-12-26 — End: 2021-12-26
  Administered 2021-12-26: 20 mg via INTRAVENOUS
  Filled 2021-12-26: qty 4

## 2021-12-26 MED ORDER — HYDRALAZINE HCL 50 MG PO TABS
100.0000 mg | ORAL_TABLET | Freq: Three times a day (TID) | ORAL | Status: DC
  Administered 2021-12-26 – 2021-12-30 (×14): 100 mg via ORAL
  Filled 2021-12-26 (×14): qty 2

## 2021-12-26 MED ORDER — ENOXAPARIN SODIUM 60 MG/0.6ML IJ SOSY
0.5000 mg/kg | PREFILLED_SYRINGE | INTRAMUSCULAR | Status: DC
  Administered 2021-12-26: 47.5 mg via SUBCUTANEOUS
  Filled 2021-12-26: qty 0.6

## 2021-12-26 MED ORDER — CHLORHEXIDINE GLUCONATE CLOTH 2 % EX PADS
6.0000 | MEDICATED_PAD | Freq: Every day | CUTANEOUS | Status: DC
  Administered 2021-12-26 – 2021-12-28 (×3): 6 via TOPICAL

## 2021-12-26 MED ORDER — FOLIC ACID 1 MG PO TABS
1.0000 mg | ORAL_TABLET | Freq: Every day | ORAL | Status: DC
  Administered 2021-12-26 – 2021-12-30 (×5): 1 mg via ORAL
  Filled 2021-12-26 (×5): qty 1

## 2021-12-26 MED ORDER — CLONIDINE HCL 0.1 MG PO TABS
0.1000 mg | ORAL_TABLET | Freq: Two times a day (BID) | ORAL | Status: DC
Start: 2021-12-26 — End: 2021-12-30
  Administered 2021-12-26 – 2021-12-29 (×8): 0.1 mg via ORAL
  Filled 2021-12-26 (×8): qty 1

## 2021-12-26 MED ORDER — THIAMINE HCL 100 MG/ML IJ SOLN: 100.0000 mg | Freq: Every day | INTRAMUSCULAR | Status: DC

## 2021-12-26 MED ORDER — LORAZEPAM 2 MG/ML IJ SOLN
1.0000 mg | INTRAMUSCULAR | Status: DC | PRN
  Administered 2021-12-26: 2 mg via INTRAVENOUS
  Administered 2021-12-26 (×3): 1 mg via INTRAVENOUS
  Administered 2021-12-26: 3 mg via INTRAVENOUS
  Administered 2021-12-26: 1 mg via INTRAVENOUS
  Administered 2021-12-26: 3 mg via INTRAVENOUS
  Administered 2021-12-26 (×2): 2 mg via INTRAVENOUS
  Administered 2021-12-27: 4 mg via INTRAVENOUS
  Administered 2021-12-27 (×2): 2 mg via INTRAVENOUS
  Administered 2021-12-27: 4 mg via INTRAVENOUS
  Administered 2021-12-27 (×2): 2 mg via INTRAVENOUS
  Administered 2021-12-28: 4 mg via INTRAVENOUS
  Filled 2021-12-26 (×3): qty 1
  Filled 2021-12-26: qty 2
  Filled 2021-12-26: qty 1
  Filled 2021-12-26 (×3): qty 2
  Filled 2021-12-26 (×5): qty 1
  Filled 2021-12-26: qty 2
  Filled 2021-12-26: qty 1

## 2021-12-26 MED ORDER — HYDRALAZINE HCL 20 MG/ML IJ SOLN
20.0000 mg | Freq: Once | INTRAMUSCULAR | Status: AC
  Administered 2021-12-26: 20 mg via INTRAVENOUS
  Filled 2021-12-26: qty 1

## 2021-12-26 MED ORDER — LORAZEPAM 2 MG/ML IJ SOLN
0.0000 mg | Freq: Two times a day (BID) | INTRAMUSCULAR | Status: DC
  Administered 2021-12-28 (×2): 2 mg via INTRAVENOUS
  Filled 2021-12-26: qty 1
  Filled 2021-12-26: qty 2

## 2021-12-26 MED ORDER — NICOTINE 21 MG/24HR TD PT24
21.0000 mg | MEDICATED_PATCH | Freq: Every day | TRANSDERMAL | Status: DC
  Administered 2021-12-26 – 2021-12-30 (×5): 21 mg via TRANSDERMAL
  Filled 2021-12-26 (×5): qty 1

## 2021-12-26 MED ORDER — CHLORDIAZEPOXIDE HCL 5 MG PO CAPS
10.0000 mg | ORAL_CAPSULE | Freq: Three times a day (TID) | ORAL | Status: DC
Start: 2021-12-26 — End: 2021-12-26
  Administered 2021-12-26: 10 mg via ORAL
  Filled 2021-12-26: qty 2

## 2021-12-26 MED ORDER — LORAZEPAM 2 MG/ML IJ SOLN
2.0000 mg | Freq: Once | INTRAMUSCULAR | Status: AC
  Administered 2021-12-26: 2 mg via INTRAVENOUS
  Filled 2021-12-26: qty 1

## 2021-12-26 MED ORDER — LORAZEPAM 2 MG/ML IJ SOLN
0.0000 mg | Freq: Four times a day (QID) | INTRAMUSCULAR | Status: DC
  Administered 2021-12-26: 1 mg via INTRAVENOUS
  Administered 2021-12-26 – 2021-12-27 (×4): 2 mg via INTRAVENOUS
  Administered 2021-12-27: 3 mg via INTRAVENOUS
  Administered 2021-12-27: 2 mg via INTRAVENOUS
  Filled 2021-12-26 (×4): qty 1
  Filled 2021-12-26: qty 2
  Filled 2021-12-26 (×3): qty 1

## 2021-12-26 MED ORDER — LOSARTAN POTASSIUM 50 MG PO TABS
100.0000 mg | ORAL_TABLET | Freq: Every day | ORAL | Status: DC
  Administered 2021-12-26 – 2021-12-30 (×5): 100 mg via ORAL
  Filled 2021-12-26 (×5): qty 2

## 2021-12-26 MED ORDER — POTASSIUM CHLORIDE 20 MEQ PO PACK
40.0000 meq | PACK | ORAL | Status: AC
  Administered 2021-12-26 (×2): 40 meq via ORAL
  Filled 2021-12-26 (×2): qty 2

## 2021-12-26 MED ORDER — SODIUM CHLORIDE 0.9 % IV SOLN: Freq: Once | INTRAVENOUS | Status: AC

## 2021-12-26 MED ORDER — ONDANSETRON HCL 4 MG/2ML IJ SOLN: 4.0000 mg | Freq: Four times a day (QID) | INTRAMUSCULAR | Status: DC | PRN

## 2021-12-26 MED ORDER — KCL-LACTATED RINGERS-D5W 20 MEQ/L IV SOLN
INTRAVENOUS | Status: DC
  Filled 2021-12-26 (×11): qty 1000

## 2021-12-26 MED ORDER — CHLORDIAZEPOXIDE HCL 25 MG PO CAPS
25.0000 mg | ORAL_CAPSULE | Freq: Three times a day (TID) | ORAL | Status: DC
  Administered 2021-12-26 – 2021-12-28 (×8): 25 mg via ORAL
  Filled 2021-12-26 (×8): qty 1

## 2021-12-26 MED ORDER — LABETALOL HCL 5 MG/ML IV SOLN
20.0000 mg | INTRAVENOUS | Status: DC | PRN
Start: 2021-12-26 — End: 2021-12-30
  Administered 2021-12-27 (×2): 20 mg via INTRAVENOUS
  Filled 2021-12-26 (×2): qty 4

## 2021-12-26 MED ORDER — THIAMINE HCL 100 MG PO TABS
100.0000 mg | ORAL_TABLET | Freq: Every day | ORAL | Status: DC
  Administered 2021-12-26 – 2021-12-30 (×5): 100 mg via ORAL
  Filled 2021-12-26 (×5): qty 1

## 2021-12-26 MED ORDER — ADULT MULTIVITAMIN W/MINERALS CH
1.0000 | ORAL_TABLET | Freq: Every day | ORAL | Status: DC
  Administered 2021-12-26 – 2021-12-30 (×5): 1 via ORAL
  Filled 2021-12-26 (×5): qty 1

## 2021-12-26 MED ORDER — AMLODIPINE BESYLATE 10 MG PO TABS
10.0000 mg | ORAL_TABLET | Freq: Every day | ORAL | Status: DC
  Administered 2021-12-26 – 2021-12-30 (×5): 10 mg via ORAL
  Filled 2021-12-26 (×2): qty 1
  Filled 2021-12-26: qty 2
  Filled 2021-12-26 (×2): qty 1

## 2021-12-26 MED ORDER — LORAZEPAM 1 MG PO TABS
1.0000 mg | ORAL_TABLET | ORAL | Status: DC | PRN
  Administered 2021-12-26: 2 mg via ORAL
  Filled 2021-12-26: qty 2

## 2021-12-26 NOTE — Hospital Course (Signed)
Justin Moore is a 56 y.o. male with medical history significant of alcohol abuse, tobacco abuse, essential hypertension, previous detoxification that has failed who also presented to the ER now with restlessness, irritability and confusion. Patient last drinking of alcohol about 2 days ago.

## 2021-12-26 NOTE — Progress Notes (Signed)
  Progress Note   Patient: Justin Moore [id number] DOB: 01/01/1966 DOA: 12/25/2021     0 DOS: the patient was seen and examined on 12/26/2021   Brief hospital course: Justin Moore is a 56 y.o. male with medical history significant of alcohol abuse, tobacco abuse, essential hypertension, previous detoxification that has failed who also presented to the ER now with restlessness, irritability and confusion. Patient last drinking of alcohol about 2 days ago.  Assessment and Plan: Alcohol withdrawal delirium. Chronic alcohol use disorder. Alcoholic hepatitis. Patient has significant tremor and confusion today.  Has limited p.o. intake, will continue fluids. Continue CIWA protocol as needed Ativan with thiamine and folic acid.  I will also add Librium to the regimen.  Hypertension emergency. Patient has severe hypertension due to alcohol withdrawal.  Also has altered mental status.. Currently on amlodipine, losartan and metoprolol.  I will also add clonidine 0.1 mg twice a day.  Hypokalemia. Continue replete potassium, monitor potassium magnesium and phosphorus.  Obesity with a BMI 31.93. Continue to follow, diet and exercise will be advised when patient is more awake.    Subjective:  Has some confusion, no agitation.  Has hand tremor. No short of breath or cough.  Physical Exam: Vitals:   12/26/21 0930 12/26/21 1030 12/26/21 1100 12/26/21 1139  BP: (!) 188/118 (!) 182/117 (!) 192/126 (!) 183/124  Pulse: (!) 102 (!) 106 100 (!) 108  Resp:  (!) 24 (!) 21   Temp:      TempSrc:      SpO2:  98% 96%   Weight:      Height:       General exam: Appears calm and comfortable  Respiratory system: Clear to auscultation. Respiratory effort normal. Cardiovascular system: S1 & S2 heard, regular and tachycardic. No JVD, murmurs, rubs, gallops or clicks. No pedal edema. Gastrointestinal system: Abdomen is nondistended, soft and nontender. No organomegaly or masses felt. Normal bowel  sounds heard. Central nervous system: Alert and oriented x2. No focal neurological deficits. Extremities: Symmetric 5 x 5 power. Skin: No rashes, lesions or ulcers   Data Reviewed:  All labs reviewed.  Family Communication: Wife updated at the bedside  Disposition: Status is: Inpatient Remains inpatient appropriate because: Severity of disease, IV treatment.  High risk for deterioration.  Planned Discharge Destination: Home    Time spent: No charge minutes  Author: Sharen Hones, MD 12/26/2021 11:48 AM  For on call review [email or web address].

## 2021-12-26 NOTE — ED Notes (Signed)
Pt repeat BP 183/114 (130). Pt has been receiving ativan per CIWA protocol. Has received a total 12 mg ativan since 7 pm. New orders for IV labetalol.

## 2021-12-26 NOTE — ED Notes (Signed)
Provider will come see pt soon and determine whether IV BP meds needed.  Wife at bedside again. Pt alert, are breakfast.

## 2021-12-26 NOTE — ED Notes (Signed)
Wife at bedside helping pt to eat lunch.

## 2021-12-26 NOTE — ED Notes (Signed)
HTN noted on monitor, BP cuff adjusted. Pt given PO Hydralazine 100 mg for pressure.

## 2021-12-26 NOTE — ED Notes (Signed)
Repeat BP 214/136 after second dose of IV labetalol.

## 2021-12-26 NOTE — H&P (Signed)
History and Physical    Patient: Justin Moore [id number]:[id number] DOB: [birth year] DOA: 12/25/2021 DOS: the patient was seen and examined on 12/26/2021 PCP: Pcp, No  Patient coming from: Home  Chief Complaint:  Chief Complaint  Patient presents with   Detox   HPI: Justin Moore is a 56 y.o. male with medical history significant of alcohol abuse, tobacco abuse, essential hypertension, previous detoxification that has failed who also presented to the ER now with restlessness, irritability and confusion.  Patient's wife is with the patient.  He apparently has been tried to quit drinking on his own.  His last drink was last night.  Patient came to the ER wanting to have detoxification as he has failed at home. Attempt was made to get patient to some detox facilities but no beds available anywhere.  He is beginning to be clinically worsening with the tremors and irritability.  Patient has had similar episodes in the past where he was admitted and required Precedex.  At this point he is still somewhat hemodynamically stable.  He will therefore be admitted to the hospital with alcohol withdrawals early delirium.  He denied any other substance.  Drug screen shows positive for cannabis only.  Patient also smokes about a pack per day of cigarettes.  He is otherwise hemodynamically stable.  Review of Systems: As mentioned in the history of present illness. All other systems reviewed and are negative. Past Medical History:  Diagnosis Date   Hypertension    History reviewed. No pertinent surgical history. Social History:  reports that he has been smoking. He has been smoking an average of .5 packs per day. He uses smokeless tobacco. He reports current alcohol use. He reports that he does not use drugs.  No Known Allergies  Family History  Problem Relation Age of Onset   Hypertension Mother    Heart attack Father    Hypertension Father     Prior to Admission medications   Medication Sig Start Date  End Date Taking? Authorizing Provider  amLODipine (NORVASC) 10 MG tablet Take 1 tablet (10 mg total) by mouth daily. 05/25/21 08/23/21  [place] Eric J, DO  Blood Pressure Monitor KIT 1 kit by Does not apply route daily. Patient not taking: Reported on 12/25/2021 11/28/19   Iloabachie, Chioma E, NP  chlorthalidone (HYGROTON) 25 MG tablet Take 1 tablet (25 mg total) by mouth daily. 05/25/21 08/23/21  [place] Eric J, DO  hydrALAZINE (APRESOLINE) 100 MG tablet Take 1 tablet (100 mg total) by mouth every 8 (eight) hours. 05/25/21 08/23/21  [place] Donnamarie Poag, DO  losartan (COZAAR) 100 MG tablet Take 1 tablet (100 mg total) by mouth daily. 05/26/21 08/24/21  [place] Donnamarie Poag, DO  MELATONIN ER PO Take by mouth. Patient not taking: Reported on 12/25/2021    [provider]    Physical Exam: Vitals:   12/25/21 1601 12/25/21 2024 12/25/21 2158 12/25/21 2200  BP:  (!) 207/125  (!) 208/122  Pulse:  96 (!) 104 (!) 104  Resp:    19  Temp:      TempSrc:      SpO2:  97%  97%  Weight: 95.3 kg     Height: 5' 8" (1.727 m)      General: Acutely ill looking, tremulous, mild confusion, no distress HEENT: PERRL, EOMI, injected conjunctiva no jaundice Neck: Supple, no JVD no lymphadenopathy Respiratory: Good air entry bilaterally no wheeze rales or crackles Cardiovascular system: Sinus tachycardia Abdomen: Soft, nontender, no organomegaly. Extremity: No  edema cyanosis or clubbing Skin exam: No rashes or ulcers Neuro exam: Tremulous: Moves all limbs, no focal findings Psych: Very anxious, tremulous, no agitation  Data Reviewed:  Temperature 99.3, blood pressure 208/122, pulse 106, respiratory rate of 19, oxygen sat 96% on room air.  White count 5.5 hemoglobin 14.6 platelets 85, sodium 130 potassium 3.9 chloride 97 CO2 23 BUN 14 creatinine 0.96 calcium 9.6 glucose 146. LFTs elevated with AST 256 ALT 191 total protein 8.3 total bili 1.7.  Alkaline phosphatase is 83.  Assessment and Plan:   #1 alcohol  withdrawal delirium: Patient is already showing signs of alcohol withdrawal with confusion and evidence of DT.  Patient will be admitted for treatment of alcohol withdrawals symptoms.  We will initiate CIWA protocol.  If patient deteriorates might need to be moved to a higher level of care with Precedex drip.  #2 alcohol abuse: Patient agrees to detoxification and quitting alcohol altogether.  We will continue to encourage such from the patient.  #3 tobacco abuse: Initiate tobacco cessation counseling and add nicotine patch  #4 alcoholic hepatitis: Chronic alcohol.  Appears chronic.  Monitor LFTs.  Continue close monitoring.  #5 hypokalemia: Most likely due to alcohol abuse.  Check magnesium level.  Replete potassium  #6 hypertensive urgency: Patient on amlodipine, losartan and hydralazine at home.  Resume home regimen and adjust medications as necessary     Advance Care Planning:   Code Status: Prior full code  Consults: None  Family Communication: Wife at bedside  Severity of Illness: The appropriate patient status for this patient is INPATIENT. Inpatient status is judged to be reasonable and necessary in order to provide the required intensity of service to ensure the patient's safety. The patient's presenting symptoms, physical exam findings, and initial radiographic and laboratory data in the context of their chronic comorbidities is felt to place them at high risk for further clinical deterioration. Furthermore, it is not anticipated that the patient will be medically stable for discharge from the hospital within 2 midnights of admission.   * I certify that at the point of admission it is my clinical judgment that the patient will require inpatient hospital care spanning beyond 2 midnights from the point of admission due to high intensity of service, high risk for further deterioration and high frequency of surveillance required.*  AuthorBarbette Merino, MD 12/26/2021 12:37 AM  For on  call review [email or web address].

## 2021-12-26 NOTE — ED Notes (Signed)
Re-discussed elevated BP despite IV labetolol given to Pt. BP 197/126. An additional order of labetalol given per Steward Drone NP

## 2021-12-26 NOTE — ED Notes (Signed)
Concerns for elevated BP discussed with floor coverage. Ordered for one time dose of labetalol placed and med administered to pt.

## 2021-12-26 NOTE — BH Assessment (Signed)
TTS consult isn't need at this time. Patient to be admitted to the medical floor.

## 2021-12-26 NOTE — TOC Initial Note (Addendum)
Transition of Care [facility]) - Initial/Assessment Note    Patient Details  Name: Justin Moore MRN: [id number] Date of Birth: 17-Apr-1966  [facility] CM/SW Contact:    Merrily Brittle, LCSWA Phone Number: 12/26/2021, 9:41 AM  Clinical Narrative:                  [facility] consult for substance use concerns. CSW attempted to talk to patient at bedside but patient was asleep. CSW explained and provided outpatient and residential substance use resources to patient's wife at bedside.      No further TOC needs at this time.   Patient Goals and CMS Choice        Expected Discharge Plan and Services                                                Prior Living Arrangements/Services                       Activities of Daily Living      Permission Sought/Granted                  Emotional Assessment              Admission diagnosis:  Alcohol withdrawal delirium, acute, hyperactive (HCC) [F10.931] Patient Active Problem List   Diagnosis Date Noted   Alcohol withdrawal delirium, acute, hyperactive (HCC) 12/26/2021   Thrombocytopenia (HCC) 12/26/2021   Hypokalemia 12/26/2021   Alcoholic hepatitis without ascites 05/20/2021   Alcohol abuse 05/20/2021   Smoking 12/05/2019   Elevated lipids 12/05/2019   Leukocytosis 12/05/2019   Encounter to establish care 11/28/2019   Essential hypertension 11/28/2019   PCP:  Pcp, No Pharmacy:   [id number] [place], [place] - [address] [place] [address] Phone: [phone] Fax: [phone]     Social Determinants of Health (SDOH) Interventions    Readmission Risk Interventions     View : No data to display.

## 2021-12-26 NOTE — ED Notes (Signed)
In conversation with attending provider regarding continued high BP readings and need for hourly doses of Ativan. Last BP reading was 190/127.

## 2021-12-26 NOTE — ED Notes (Signed)
Concerns for increasing CIWA score discussed with Derrill Kay. CIWA score 16 d/t profound diaphoresis and sever tremors at rest. Orders for IV ativan obtain.

## 2021-12-26 NOTE — Progress Notes (Signed)
PHARMACIST - PHYSICIAN COMMUNICATION  CONCERNING:  Enoxaparin (Lovenox) for DVT Prophylaxis    RECOMMENDATION: Patient was prescribed enoxaprin 40mg  q24 hours for VTE prophylaxis.   Filed Weights   12/25/21 1601  Weight: 95.3 kg (210 lb)    Body mass index is 31.93 kg/m.  Estimated Creatinine Clearance: 98.5 mL/min (by C-G formula based on SCr of 0.96 mg/dL).   Based on [place] patient is candidate for enoxaparin 0.5mg /kg TBW SQ every 24 hours based on BMI being >30.  DESCRIPTION: Pharmacy has adjusted enoxaparin dose per [facility] policy.  Patient is now receiving enoxaparin 0.5 mg/kg every 24 hours   Renda Rolls, PharmD, [facility] 12/26/2021 1:13 AM

## 2021-12-26 NOTE — ED Notes (Signed)
Provider at bedside

## 2021-12-26 NOTE — ED Notes (Signed)
Pt continues to be hypertensive with severe tremor. Pt is oriented and able to do serial additions. Provided cola drink per pt request.

## 2021-12-26 NOTE — ED Notes (Signed)
BP is 199/118. Contacted Dr Chipper Herb re: order IV BP meds or just to give PO BP meds tyhis morning.

## 2021-12-27 DIAGNOSIS — D696 Thrombocytopenia, unspecified: Secondary | ICD-10-CM

## 2021-12-27 DIAGNOSIS — I161 Hypertensive emergency: Secondary | ICD-10-CM

## 2021-12-27 LAB — BASIC METABOLIC PANEL
Anion gap: 11 (ref 5–15)
BUN: 11 mg/dL (ref 6–20)
CO2: 21 mmol/L — ABNORMAL LOW (ref 22–32)
Calcium: 10.1 mg/dL (ref 8.9–10.3)
Chloride: 103 mmol/L (ref 98–111)
Creatinine, Ser: 0.94 mg/dL (ref 0.61–1.24)
GFR, Estimated: >60 mL/min (ref >60)
Glucose, Bld: 128 mg/dL — ABNORMAL HIGH (ref 70–99)
Potassium: 3.5 mmol/L (ref 3.5–5.1)
Sodium: 135 mmol/L (ref 135–145)

## 2021-12-27 LAB — MAGNESIUM: Magnesium: 1.6 mg/dL — ABNORMAL LOW (ref 1.7–2.4)

## 2021-12-27 LAB — PHOSPHORUS: Phosphorus: 2.3 mg/dL — ABNORMAL LOW (ref 2.5–4.6)

## 2021-12-27 MED ORDER — MAGNESIUM SULFATE 2 GM/50ML IV SOLN
2.0000 g | Freq: Once | INTRAVENOUS | Status: AC
  Administered 2021-12-27: 2 g via INTRAVENOUS
  Filled 2021-12-27: qty 50

## 2021-12-27 MED ORDER — ALUM & MAG HYDROXIDE-SIMETH 200-200-20 MG/5ML PO SUSP
30.0000 mL | Freq: Once | ORAL | Status: AC
  Administered 2021-12-27: 30 mL via ORAL
  Filled 2021-12-27: qty 30

## 2021-12-27 MED ORDER — POTASSIUM & SODIUM PHOSPHATES 280-160-250 MG PO PACK
1.0000 | PACK | Freq: Three times a day (TID) | ORAL | Status: AC
  Administered 2021-12-27 – 2021-12-28 (×3): 1 via ORAL
  Filled 2021-12-27 (×3): qty 1

## 2021-12-27 MED ORDER — BLISTEX MEDICATED EX OINT: TOPICAL_OINTMENT | CUTANEOUS | Status: DC | PRN

## 2021-12-27 MED ORDER — POTASSIUM CHLORIDE 20 MEQ PO PACK
40.0000 meq | PACK | Freq: Once | ORAL | Status: AC
  Administered 2021-12-27: 40 meq via ORAL
  Filled 2021-12-27: qty 2

## 2021-12-27 NOTE — Progress Notes (Signed)
Neuro: not following commands, attempting to get out of bed, no reports of pain, afebrile, CIWA q1h assessment with scheduled Ativan q6h  Resp: Room air with adequate saturation  Cardio: NSR to ST   GI/GU: Smear BM at 1030; Condom cath with brown colored urine adequate output    Skin: clammy/diaphoretic   Psych: Oriented to person and year, disoriented to place and situation. Frequently speaks of needing to get car he is in repaired, reports seeing other people in his room to his wife at bedside  Events:

## 2021-12-27 NOTE — Progress Notes (Signed)
PRN Labetalol for BP 180/110.

## 2021-12-27 NOTE — Progress Notes (Addendum)
  Progress Note   Patient: Justin Moore [id number] DOB: October 23, 1965 DOA: 12/25/2021     1 DOS: the patient was seen and examined on 12/27/2021   Brief hospital course: Justin Moore is a 56 y.o. male with medical history significant of alcohol abuse, tobacco abuse, essential hypertension, previous detoxification that has failed who also presented to the ER now with restlessness, irritability and confusion. Patient last drinking of alcohol about 2 days ago.  Assessment and Plan:  Alcohol withdrawal delirium. Chronic alcohol use disorder. Alcoholic hepatitis. Patient still requiring high dose of Ativan, continue scheduled Librium. Patient condition is more stable today, will transfer patient to regular medical floor with telemetry.  Hypertension emergency. Blood pressure much better today, continue home regimen, continue clonidine patch.  Discontinue metoprolol.   Hypokalemia. Hypomagnesemia.   Obesity with a BMI 31.93. Continue to follow, diet and exercise will be advised when patient is more awake.  Thrombocytopenia. Secondary to alcohol drinking.  Patient currently not on any heparin.  Recheck a CBC tomorrow.      Subjective:  Patient has some sleepiness this morning, no confusion.  Still poor appetite, no nausea vomiting. Not short of breath.  Physical Exam: Vitals:   12/27/21 0905 12/27/21 1000 12/27/21 1100 12/27/21 1200  BP: (!) 144/115 (!) 128/95 (!) 139/95 (!) 139/100  Pulse: 99 95 95 83  Resp:  18 18 15   Temp:    98.1 F (36.7 C)  TempSrc:    Oral  SpO2:  93% 96% 100%  Weight:      Height:       General exam: Appears calm and comfortable  Respiratory system: Clear to auscultation. Respiratory effort normal. Cardiovascular system: S1 & S2 heard, RRR. No JVD, murmurs, rubs, gallops or clicks. No pedal edema. Gastrointestinal system: Abdomen is nondistended, soft and nontender. No organomegaly or masses felt. Normal bowel sounds heard. Central nervous  system: Sleepy and oriented x3. No focal neurological deficits. Extremities: Symmetric 5 x 5 power. Skin: No rashes, lesions or ulcers Psychiatry: Judgement and insight appear normal. Mood & affect appropriate.   Data Reviewed:  Lab results reviewed.  Family Communication:   Disposition: Status is: Inpatient Remains inpatient appropriate because: Severity of disease, IV treatment  Planned Discharge Destination: Home    Time spent: 29 minutes  Author: Sharen Hones, MD 12/27/2021 12:17 PM  For on call review [email or web address].

## 2021-12-28 DIAGNOSIS — E876 Hypokalemia: Secondary | ICD-10-CM

## 2021-12-28 LAB — CBC
HCT: 36.4 % — ABNORMAL LOW (ref 39.0–52.0)
Hemoglobin: 13.2 g/dL (ref 13.0–17.0)
MCH: 33.2 pg (ref 26.0–34.0)
MCHC: 36.3 g/dL — ABNORMAL HIGH (ref 30.0–36.0)
MCV: 91.5 fL (ref 80.0–100.0)
Platelets: 68 10*3/uL — ABNORMAL LOW (ref 150–400)
RBC: 3.98 MIL/uL — ABNORMAL LOW (ref 4.22–5.81)
RDW: 13.1 % (ref 11.5–15.5)
WBC: 7 10*3/uL (ref 4.0–10.5)
nRBC: 0 % (ref 0.0–0.2)

## 2021-12-28 LAB — BASIC METABOLIC PANEL
Anion gap: 6 (ref 5–15)
BUN: 12 mg/dL (ref 6–20)
CO2: 24 mmol/L (ref 22–32)
Calcium: 9.8 mg/dL (ref 8.9–10.3)
Chloride: 107 mmol/L (ref 98–111)
Creatinine, Ser: 0.91 mg/dL (ref 0.61–1.24)
GFR, Estimated: >60 mL/min (ref >60)
Glucose, Bld: 119 mg/dL — ABNORMAL HIGH (ref 70–99)
Potassium: 3.5 mmol/L (ref 3.5–5.1)
Sodium: 137 mmol/L (ref 135–145)

## 2021-12-28 LAB — PHOSPHORUS: Phosphorus: 3.3 mg/dL (ref 2.5–4.6)

## 2021-12-28 LAB — MAGNESIUM: Magnesium: 2.1 mg/dL (ref 1.7–2.4)

## 2021-12-28 MED ORDER — POTASSIUM CHLORIDE CRYS ER 20 MEQ PO TBCR
40.0000 meq | EXTENDED_RELEASE_TABLET | Freq: Once | ORAL | Status: AC
  Administered 2021-12-28: 40 meq via ORAL
  Filled 2021-12-28: qty 2

## 2021-12-28 MED ORDER — POTASSIUM CHLORIDE 20 MEQ PO PACK: 40.0000 meq | PACK | Freq: Once | ORAL | Status: DC

## 2021-12-28 NOTE — Plan of Care (Signed)
Neuro: alert and oriented-improved from yesterday- CIWA scores documented and appear to be improving, moving weakly in bed, could benefit from PT/OT Resp: stable on room air, congested/productive cough-taught self suction using yaunker CV: afebrile, vital signs fairly stable, no edema GIGU: condom cath in use, BM today, tolerating PO well, appetite improving Skin: intact Social: Wife at the bedside throughout the day, all questions and concerns addressed, multiple conversations with them regarding his desire to quit drinking and live a healthier lifestyle for himself and his family.  Events: Transferred to room 218, report given to [facility]. Wife, Victorino Dike notified via phone call, she will meet patient in new room. All belongings confirmed with patient-sent with patient to new room-phone, phone charger, ear buds, shoes, top and bottom, socks, reading glasses, mouth wash, lip balm  Problem: Education: Goal: Knowledge of General Education information will improve Description: Including pain rating scale, medication(s)/side effects and non-pharmacologic comfort measures Outcome: Progressing   Problem: Health Behavior/Discharge Planning: Goal: Ability to manage health-related needs will improve Outcome: Progressing   Problem: Clinical Measurements: Goal: Ability to maintain clinical measurements within normal limits will improve Outcome: Progressing Goal: Will remain free from infection Outcome: Progressing Goal: Diagnostic test results will improve Outcome: Progressing Goal: Respiratory complications will improve Outcome: Progressing Goal: Cardiovascular complication will be avoided Outcome: Progressing   Problem: Activity: Goal: Risk for activity intolerance will decrease Outcome: Progressing   Problem: Nutrition: Goal: Adequate nutrition will be maintained Outcome: Progressing   Problem: Coping: Goal: Level of anxiety will decrease Outcome: Progressing   Problem:  Elimination: Goal: Will not experience complications related to bowel motility Outcome: Progressing Goal: Will not experience complications related to urinary retention Outcome: Progressing   Problem: Pain Managment: Goal: General experience of comfort will improve Outcome: Progressing   Problem: Safety: Goal: Ability to remain free from injury will improve Outcome: Progressing   Problem: Skin Integrity: Goal: Risk for impaired skin integrity will decrease Outcome: Progressing

## 2021-12-28 NOTE — Progress Notes (Signed)
PT Cancellation Note  Patient Details Name: Justin Moore MRN: [id number] DOB: 01-Dec-1965   Cancelled Treatment:    Reason Eval/Treat Not Completed: Patient's level of consciousness (Chart reviewed, RN consulted, evaluation attempted, unable to awaken pt who is sleeping heavily. Occasional partial verbal response attempts,does not open eyes.) Will attempt evaluation again at later date/time.   4:43 PM, 12/28/21 Etta Grandchild, PT, DPT Physical Therapist - [facility]  [phone] ([place])   [place] 12/28/2021, 4:43 PM

## 2021-12-28 NOTE — Progress Notes (Signed)
  Progress Note   Patient: Justin Moore ZHG:[id number] DOB: [birth year] DOA: 12/25/2021     2 DOS: the patient was seen and examined on 12/28/2021   Brief hospital course: HENDRY SPEAS is a 56 y.o. male with medical history significant of alcohol abuse, tobacco abuse, essential hypertension, previous detoxification that has failed who also presented to the ER now with restlessness, irritability and confusion. Patient is diagnosed with alcohol withdrawal, placed on CIWA.  Assessment and Plan:  Alcohol withdrawal delirium. Chronic alcohol use disorder. Alcoholic hepatitis. Patient condition continues to improve, he no longer has any confusion.  Still has hand tremor.  Still requiring frequent IV Ativan.  But clinically improved, will transfer to general medical floor today.  Hypertension emergency. Blood pressure much better.   Hypokalemia. Hypomagnesemia. Potassium 3.5, give additional 40 mEq orally.  Discontinue maintenance fluids as patient has a better appetite.   Obesity with a BMI 31.93. Diet and exercise advised   Thrombocytopenia. Secondary to alcohol drinking.  Continue to follow.  Patient is not on heparin.     Subjective:  Patient appetite improved today, no confusion.  Still has hand tremor.  Physical Exam: Vitals:   12/28/21 0600 12/28/21 0630 12/28/21 0700 12/28/21 0800  BP: (!) 159/106  (!) 140/93   Pulse: 88 94 99 (!) 109  Resp: 18 17 (!) 24 (!) 24  Temp:    98.4 F (36.9 C)  TempSrc:    Oral  SpO2: 97% 95% 98% 96%  Weight:      Height:       General exam: Appears calm and comfortable  Respiratory system: Clear to auscultation. Respiratory effort normal. Cardiovascular system: S1 & S2 heard, RRR. No JVD, murmurs, rubs, gallops or clicks. No pedal edema. Gastrointestinal system: Abdomen is nondistended, soft and nontender. No organomegaly or masses felt. Normal bowel sounds heard. Central nervous system: Alert and oriented. No focal neurological  deficits. Extremities: Symmetric 5 x 5 power. Skin: No rashes, lesions or ulcers Psychiatry: Judgement and insight appear normal. Mood & affect appropriate.   Data Reviewed:  Lab results reviewed  Family Communication: Wife updated at the bedside.  Disposition: Status is: Inpatient Remains inpatient appropriate because: Severity of disease, IV treatment.  Planned Discharge Destination: Home    Time spent: 28 minutes  Author: Marrion Coy, MD 12/28/2021 12:36 PM  For on call review [email or web address].

## 2021-12-29 LAB — CBC
HCT: 37.1 % — ABNORMAL LOW (ref 39.0–52.0)
Hemoglobin: 13.4 g/dL (ref 13.0–17.0)
MCH: 33.2 pg (ref 26.0–34.0)
MCHC: 36.1 g/dL — ABNORMAL HIGH (ref 30.0–36.0)
MCV: 91.8 fL (ref 80.0–100.0)
Platelets: 86 10*3/uL — ABNORMAL LOW (ref 150–400)
RBC: 4.04 MIL/uL — ABNORMAL LOW (ref 4.22–5.81)
RDW: 12.9 % (ref 11.5–15.5)
WBC: 7.1 10*3/uL (ref 4.0–10.5)
nRBC: 0 % (ref 0.0–0.2)

## 2021-12-29 LAB — BASIC METABOLIC PANEL
Anion gap: 7 (ref 5–15)
BUN: 13 mg/dL (ref 6–20)
CO2: 23 mmol/L (ref 22–32)
Calcium: 9.8 mg/dL (ref 8.9–10.3)
Chloride: 105 mmol/L (ref 98–111)
Creatinine, Ser: 0.84 mg/dL (ref 0.61–1.24)
GFR, Estimated: >60 mL/min (ref >60)
Glucose, Bld: 129 mg/dL — ABNORMAL HIGH (ref 70–99)
Potassium: 3.8 mmol/L (ref 3.5–5.1)
Sodium: 135 mmol/L (ref 135–145)

## 2021-12-29 LAB — MAGNESIUM: Magnesium: 2 mg/dL (ref 1.7–2.4)

## 2021-12-29 LAB — PHOSPHORUS: Phosphorus: 3.7 mg/dL (ref 2.5–4.6)

## 2021-12-29 MED ORDER — CHLORDIAZEPOXIDE HCL 25 MG PO CAPS
25.0000 mg | ORAL_CAPSULE | Freq: Every day | ORAL | Status: AC
  Administered 2021-12-29: 25 mg via ORAL

## 2021-12-29 MED ORDER — POLYETHYLENE GLYCOL 3350 17 G PO PACK
34.0000 g | PACK | ORAL | Status: AC
  Administered 2021-12-29 (×3): 34 g via ORAL
  Filled 2021-12-29 (×3): qty 2

## 2021-12-29 MED ORDER — CHLORDIAZEPOXIDE HCL 25 MG PO CAPS
25.0000 mg | ORAL_CAPSULE | Freq: Four times a day (QID) | ORAL | Status: DC | PRN
  Administered 2021-12-29: 25 mg via ORAL

## 2021-12-29 MED ORDER — CHLORDIAZEPOXIDE HCL 25 MG PO CAPS: 25.0000 mg | ORAL_CAPSULE | ORAL | Status: DC

## 2021-12-29 MED ORDER — CHLORDIAZEPOXIDE HCL 25 MG PO CAPS
25.0000 mg | ORAL_CAPSULE | Freq: Four times a day (QID) | ORAL | Status: AC
  Administered 2021-12-29 (×3): 25 mg via ORAL
  Filled 2021-12-29 (×4): qty 1

## 2021-12-29 MED ORDER — CHLORDIAZEPOXIDE HCL 25 MG PO CAPS
25.0000 mg | ORAL_CAPSULE | Freq: Three times a day (TID) | ORAL | Status: DC
  Administered 2021-12-30: 25 mg via ORAL
  Filled 2021-12-29: qty 1

## 2021-12-29 NOTE — Evaluation (Signed)
Physical Therapy Evaluation Patient Details Name: Justin Moore MRN: [id number] DOB: 1966-06-03 Today's Date: 12/29/2021  History of Present Illness  Justin Moore is a 56yoM who comes to [facility] on 12/28/21 c signs of ETOH withdrawl pt reports he is trying to stop a daily drinking habit. Per SO some AMS and irritability. PMH: HTN.  Clinical Impression  Pt admitted with above diagnosis. Pt currently with functional limitations due to the deficits listed below (see "PT Problem List"). Upon entry, pt in bed, awake and agreeable to participate. The pt is alert, pleasant, interactive, and able to provide info regarding prior level of function, both in tolerance and independence. Pt is awake and oriented, but sleepy, tremulous, and weak. Moving to EOB and standing requires maximal effort and minA.  HR rapidly increases to 130s upon standing, 145 after transfer to chair. Pt has several LOB in session, most are backward, requires up to maxA to recovery standing posture. Patient's performance this date reveals decreased ability, independence, and tolerance in performing all basic mobility required for performance of activities of daily living. Pt requires additional DME, close physical assistance, and cues for safe participate in mobility. Pt will benefit from skilled PT intervention to increase independence and safety with basic mobility in preparation for discharge to the venue listed below.       Recommendations for follow up therapy are one component of a multi-disciplinary discharge planning process, led by the attending physician.  Recommendations may be updated based on patient status, additional functional criteria and insurance authorization.  Follow Up Recommendations Home health PT    Assistance Recommended at Discharge Frequent or constant Supervision/Assistance  Patient can return home with the following  A little help with bathing/dressing/bathroom;A lot of help with walking and/or  transfers;Assistance with cooking/housework;Assist for transportation    Equipment Recommendations Rolling walker (2 wheels)  Recommendations for Other Services       Functional Status Assessment Patient has had a recent decline in their functional status and demonstrates the ability to make significant improvements in function in a reasonable and predictable amount of time.     Precautions / Restrictions Precautions Precautions: Fall Restrictions Weight Bearing Restrictions: No      Mobility  Bed Mobility Overal bed mobility: Needs Assistance Bed Mobility: Supine to Sit     Supine to sit: Supervision     General bed mobility comments: very labored and difficult, but no physical assist needed    Transfers Overall transfer level: Needs assistance Equipment used: 1 person hand held assist Transfers: Sit to/from Stand Sit to Stand: Min assist           General transfer comment: very labored, difficulty with unsteadiness, several posterior LOB    Ambulation/Gait Ambulation/Gait assistance:  (only able to take steps to recliner, labored/unsteady; HR increases to 140s.)                Stairs            Wheelchair Mobility    Modified Rankin (Stroke Patients Only)       Balance                                             Pertinent Vitals/Pain Pain Assessment Pain Assessment: 0-10 Pain Score: 8  Pain Location: Left knee chronic pain, acute RUE pain from IV infiltration edema Pain Intervention(s): Limited activity within patient's  tolerance, Monitored during session, Premedicated before session    Home Living Family/patient expects to be discharged to:: Private residence Living Arrangements: Spouse/significant other Available Help at Discharge: Available 24 hours/day (wife, lots of family nearby) Type of Home: Apartment Home Access: Level entry       Home Layout: One level Home Equipment: Shower seat - built in       Prior Function Prior Level of Function : Independent/Modified Independent;Working/employed;Driving                     Hand Dominance   Dominant Hand: Right    Extremity/Trunk Assessment   Upper Extremity Assessment Upper Extremity Assessment: Overall WFL for tasks assessed    Lower Extremity Assessment Lower Extremity Assessment: Overall WFL for tasks assessed       Communication      Cognition Arousal/Alertness: Awake/alert Behavior During Therapy: WFL for tasks assessed/performed Overall Cognitive Status: Within Functional Limits for tasks assessed                                          General Comments      Exercises     Assessment/Plan    PT Assessment Patient needs continued PT services  PT Problem List Decreased strength;Decreased activity tolerance;Decreased mobility;Decreased balance;Decreased safety awareness;Decreased coordination;Cardiopulmonary status limiting activity       PT Treatment Interventions DME instruction;Balance training;Gait training;Stair training;Therapeutic activities;Functional mobility training;Therapeutic exercise;Patient/family education;Neuromuscular re-education    PT Goals (Current goals can be found in the Care Plan section)  Acute Rehab PT Goals Patient Stated Goal: regain strength and independence PT Goal Formulation: With patient Time For Goal Achievement: 01/12/22 Potential to Achieve Goals: Good    Frequency Min 2X/week     Co-evaluation               AM-PAC PT "6 Clicks" Mobility  Outcome Measure Help needed turning from your back to your side while in a flat bed without using bedrails?: A Little Help needed moving from lying on your back to sitting on the side of a flat bed without using bedrails?: A Little Help needed moving to and from a bed to a chair (including a wheelchair)?: A Lot Help needed standing up from a chair using your arms (e.g., wheelchair or bedside chair)?: A  Lot Help needed to walk in hospital room?: A Lot Help needed climbing 3-5 steps with a railing? : A Lot 6 Click Score: 14    End of Session   Activity Tolerance: Patient tolerated treatment well;No increased pain;Treatment limited secondary to medical complications (Comment) Patient left: in chair;with call bell/phone within reach Nurse Communication: Mobility status PT Visit Diagnosis: Unsteadiness on feet (R26.81);Difficulty in walking, not elsewhere classified (R26.2);Muscle weakness (generalized) (M62.81);Other symptoms and signs involving the nervous system (R29.898)    Time: 7793-9030 PT Time Calculation (min) (ACUTE ONLY): 37 min   Charges:   PT Evaluation $PT Eval Moderate Complexity: 1 Mod PT Treatments $Therapeutic Activity: 23-37 mins       10:16 AM, 12/29/21 Rosamaria Lints, PT, DPT Physical Therapist - [facility]  [phone] (ASCOM)    Autumm Hattery C 12/29/2021, 10:14 AM

## 2021-12-29 NOTE — Progress Notes (Signed)
  Progress Note   Patient: Justin Moore [id number]:[id number] DOB: 10-06-1965 DOA: 12/25/2021     3 DOS: the patient was seen and examined on 12/29/2021   Brief hospital course: ZAYAAN KOZAK is a 56 y.o. male with medical history significant of alcohol abuse, tobacco abuse, essential hypertension, previous detoxification that has failed who also presented to the ER now with restlessness, irritability and confusion. Patient is diagnosed with alcohol withdrawal, placed on CIWA.  Assessment and Plan:  Alcohol withdrawal Chronic alcohol use disorder. Alcoholic hepatitis. --d/c ativan --start librium taper --cont folic acid and thiamine  Hypertension emergency. Blood pressure much better. --cont amlodipine, clonidine, hydralazine and losartan   Hypokalemia. Hypomagnesemia. --monitor and replete PRN   Obesity with a BMI 31.93. Diet and exercise advised   Thrombocytopenia. Secondary to alcohol drinking.        Subjective:  Pt reported feeling better today.  Withdrawal symptoms improved.  PT noted pt to have significant imbalance and falling, and HR went up with minimum exertion.  Pt reported constipation.   Physical Exam:  Constitutional: NAD, alert, oriented to person and place, slow reponse HEENT: conjunctivae and lids normal, EOMI CV: No cyanosis.   RESP: normal respiratory effort, on RA Neuro: II - XII grossly intact.     Data Reviewed:  Lab results reviewed  Family Communication:   Disposition: Status is: Inpatient Remains inpatient appropriate because: Severity of disease, IV treatment.  Planned Discharge Destination: Home    Time spent: 35 minutes  Author: Darlin Priestly, MD 12/29/2021 6:47 PM  For on call review [email or web address].

## 2021-12-29 NOTE — Evaluation (Signed)
Occupational Therapy Evaluation Patient Details Name: Justin Moore MRN: [id number] DOB: July 02, 1966 Today's Date: 12/29/2021   History of Present Illness Justin Moore is a 56yoM who comes to [facility] on 12/28/21 c signs of ETOH withdrawl pt reports he is trying to stop a daily drinking habit. Per SO some AMS and irritability. PMH: HTN.   Clinical Impression   Patient presenting with decreased Ind in self care, balance, functional mobility/transfers, and endurance. Patient reports being independent at baseline and living with wife of 30+ years. 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 His wife assists with cooking and home management tasks. Patient currently functioning at min guard -  min A with and without use of RW. Prior to arrival, pt urinated on floor in room while trying to get condom cath off. OT provided pt with urinal and discussed it as an option when needed. HR increased to 120 with functional mobility. Patient will benefit from acute OT to increase overall independence in the areas of ADLs, functional mobility, and safety awareness in order to safely discharge home with family.      Recommendations for follow up therapy are one component of a multi-disciplinary discharge planning process, led by the attending physician.  Recommendations may be updated based on patient status, additional functional criteria and insurance authorization.   Follow Up Recommendations  Home health OT    Assistance Recommended at Discharge Frequent or constant Supervision/Assistance  Patient can return home with the following A little help with walking and/or transfers;A little help with bathing/dressing/bathroom;Assistance with cooking/housework;Help with stairs or ramp for entrance;Assist for transportation;Direct supervision/assist for medications management    Functional Status Assessment  Patient has had a recent decline in their functional status and demonstrates  the ability to make significant improvements in function in a reasonable and predictable amount of time.  Equipment Recommendations  Other (comment) (RW)       Precautions / Restrictions Precautions Precautions: Fall Restrictions Weight Bearing Restrictions: No      Mobility Bed Mobility               General bed mobility comments: seated in recliner chair    Transfers Overall transfer level: Needs assistance Equipment used: Rolling walker (2 wheels) Transfers: Sit to/from Stand Sit to Stand: Supervision, From elevated surface           General transfer comment: cues to emphasize forward lean on RW      Balance Overall balance assessment: Needs assistance Sitting-balance support: Feet supported Sitting balance-Leahy Scale: Good     Standing balance support: Reliant on assistive device for balance, During functional activity Standing balance-Leahy Scale: Fair                             ADL either performed or assessed with clinical judgement   ADL Overall ADL's : Needs assistance/impaired                                       General ADL Comments: set up A to don/doff B socks while seated with min guard- min A with RW for functional mobility and transfers.     Vision Patient Visual Report: No change from baseline              Pertinent Vitals/Pain Pain Assessment Pain Assessment: Faces Faces Pain Scale:  Hurts a little bit Pain Location: Left knee chronic pain, acute RUE pain from IV infiltration edema Pain Descriptors / Indicators: Aching, Discomfort Pain Intervention(s): Monitored during session, Repositioned     Hand Dominance Right   Extremity/Trunk Assessment Upper Extremity Assessment Upper Extremity Assessment: Overall WFL for tasks assessed   Lower Extremity Assessment Lower Extremity Assessment: Overall WFL for tasks assessed       Communication Communication Communication: No difficulties    Cognition Arousal/Alertness: Awake/alert Behavior During Therapy: WFL for tasks assessed/performed Overall Cognitive Status: Within Functional Limits for tasks assessed                                                  Home Living Family/patient expects to be discharged to:: Private residence Living Arrangements: Spouse/significant other Available Help at Discharge: Available 24 hours/day Type of Home: Apartment Home Access: Level entry     Home Layout: One level     Bathroom Shower/Tub: Chief Strategy OfficerTub/shower unit   Bathroom Toilet: Standard     Home Equipment: Information systems managerhower seat - built in   Additional Comments: 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 Pt driving prior to admission      Prior Functioning/Environment Prior Level of Function : Independent/Modified Independent;Working/employed;Driving             Mobility Comments: Independent with all mobility without any usage of assistive devices ADLs Comments: No assistance needed prior to admission. His wife does all the cooking and cleaning. He reports working as much as 7 days a week depending on the weather.        OT Problem List: Decreased strength;Decreased activity tolerance;Impaired balance (sitting and/or standing);Decreased knowledge of use of DME or AE;Decreased safety awareness      OT Treatment/Interventions: Self-care/ADL training;Balance training;Therapeutic exercise;Energy conservation;Therapeutic activities;DME and/or AE instruction;Patient/family education;Manual therapy    OT Goals(Current goals can be found in the care plan section) Acute Rehab OT Goals Patient Stated Goal: to go home OT Goal Formulation: With patient Time For Goal Achievement: 01/12/22 Potential to Achieve Goals: Good ADL Goals Pt Will Perform Grooming: with modified independence Pt Will Perform Lower Body Dressing: with modified independence Pt Will Transfer to Toilet: with modified  independence Pt Will Perform Toileting - Clothing Manipulation and hygiene: with modified independence  OT Frequency: Min 2X/week       AM-PAC OT "6 Clicks" Daily Activity     Outcome Measure Help from another person eating meals?: None Help from another person taking care of personal grooming?: A Little Help from another person toileting, which includes using toliet, bedpan, or urinal?: A Little Help from another person bathing (including washing, rinsing, drying)?: A Little Help from another person to put on and taking off regular upper body clothing?: None Help from another person to put on and taking off regular lower body clothing?: A Little 6 Click Score: 20   End of Session Equipment Utilized During Treatment: Rolling walker (2 wheels)  Activity Tolerance: Patient tolerated treatment well Patient left: in chair;with call bell/phone within reach;with nursing/sitter in room  OT Visit Diagnosis: Unsteadiness on feet (R26.81);Muscle weakness (generalized) (M62.81)                Time: [id number] OT Time Calculation (min): 20 min Charges:  OT General Charges $OT Visit: 1 Visit OT Evaluation $OT Eval Moderate Complexity: 1  Mod OT Treatments $Self Care/Home Management : 8-22 mins  Jackquline Denmark, MS, OTR/L , CBIS ascom [phone]  12/29/21, 4:00 PM

## 2021-12-29 NOTE — Progress Notes (Signed)
Physical Therapy Treatment Patient Details Name: Justin Moore MRN: [id number] DOB: 1965-12-20 Today's Date: 12/29/2021   History of Present Illness [name] is a 56yoM who comes to [facility] on 12/28/21 c signs of ETOH withdrawl pt reports he is trying to stop a daily drinking habit. Per SO some AMS and irritability. PMH: HTN.    PT Comments    Pt in room finished lunch with help from wife. Pt agreeable to attempt some AMB, reports he was up in chair for 1 hour 20 minutes, his knee is feeling better. RN reports he did not call for transfer back, but rather made the stand pivot himself. Pt able to transfers with supervision using a RW and from elevated surface height. Pt able to AMB in hallway slow and steady with RW, HR better controlled now compared ot earlier. Balance while up AMB still warrants minGuard assist when turning.   Recommendations for follow up therapy are one component of a multi-disciplinary discharge planning process, led by the attending physician.  Recommendations may be updated based on patient status, additional functional criteria and insurance authorization.  Follow Up Recommendations  Home health PT     Assistance Recommended at Discharge Frequent or constant Supervision/Assistance  Patient can return home with the following A little help with bathing/dressing/bathroom;A lot of help with walking and/or transfers;Assistance with cooking/housework;Assist for transportation   Equipment Recommendations  Rolling walker (2 wheels)    Recommendations for Other Services       Precautions / Restrictions Precautions Precautions: Fall Restrictions Weight Bearing Restrictions: No     Mobility  Bed Mobility Overal bed mobility: Needs Assistance Bed Mobility: Supine to Sit     Supine to sit: Min assist     General bed mobility comments: given minA of legs and hand held assist for trunk    Transfers Overall transfer level: Needs assistance Equipment used:  Rolling walker (2 wheels) Transfers: Sit to/from Stand Sit to Stand: Supervision, From elevated surface           General transfer comment: cues to emphasize forward lean on RW    Ambulation/Gait Ambulation/Gait assistance: Min guard Gait Distance (Feet): 300 Feet Assistive device: Rolling walker (2 wheels)         General Gait Details: slow and steady with heavy support on RW, some mild unsteadiness when turning around in hallway, but generally well tolerate. HR much better controlled, no higher than 115bpm (145bpm with bed to chair transfer this morning)   Stairs             Wheelchair Mobility    Modified Rankin (Stroke Patients Only)       Balance Overall balance assessment: Modified Independent                                          Cognition Arousal/Alertness: Awake/alert Behavior During Therapy: WFL for tasks assessed/performed Overall Cognitive Status: Within Functional Limits for tasks assessed                                          Exercises      General Comments        Pertinent Vitals/Pain Pain Assessment Pain Assessment: Faces Pain Score: 8  Faces Pain Scale: Hurts little more Pain Location: Left knee chronic  pain, acute RUE pain from IV infiltration edema Pain Descriptors / Indicators: Aching Pain Intervention(s): Limited activity within patient's tolerance, Monitored during session    Home Living                          Prior Function            PT Goals (current goals can now be found in the care plan section) Acute Rehab PT Goals Patient Stated Goal: regain strength and independence PT Goal Formulation: With patient Time For Goal Achievement: 01/12/22 Potential to Achieve Goals: Good Progress towards PT goals: Progressing toward goals    Frequency    Min 2X/week      PT Plan Current plan remains appropriate    Co-evaluation              AM-PAC PT "6  Clicks" Mobility   Outcome Measure  Help needed turning from your back to your side while in a flat bed without using bedrails?: A Little Help needed moving from lying on your back to sitting on the side of a flat bed without using bedrails?: A Little Help needed moving to and from a bed to a chair (including a wheelchair)?: A Lot Help needed standing up from a chair using your arms (e.g., wheelchair or bedside chair)?: A Lot Help needed to walk in hospital room?: A Lot Help needed climbing 3-5 steps with a railing? : A Lot 6 Click Score: 14    End of Session Equipment Utilized During Treatment: Gait belt Activity Tolerance: Patient tolerated treatment well;No increased pain;Patient limited by fatigue Patient left: with call bell/phone within reach;in bed (EOB with tray presented) Nurse Communication: Mobility status PT Visit Diagnosis: Unsteadiness on feet (R26.81);Difficulty in walking, not elsewhere classified (R26.2);Muscle weakness (generalized) (M62.81);Other symptoms and signs involving the nervous system (R29.898)     Time: 6295-2841 PT Time Calculation (min) (ACUTE ONLY): 28 min  Charges:  $Gait Training: 8-22 mins $Therapeutic Exercise: 8-22 mins                    3:03 PM, 12/29/21 Rosamaria Lints, PT, DPT Physical Therapist - [facility]  [phone] (ASCOM)     Makael Stein C 12/29/2021, 3:01 PM

## 2021-12-30 ENCOUNTER — Other Ambulatory Visit: Payer: Self-pay

## 2021-12-30 MED ORDER — VITAMIN B-1 100 MG PO TABS
100.0000 mg | ORAL_TABLET | Freq: Every day | ORAL | 2 refills | Status: AC
  Filled 2021-12-30: qty 30, 30d supply, fill #0

## 2021-12-30 MED ORDER — CHLORTHALIDONE 25 MG PO TABS: 25.0000 mg | ORAL_TABLET | Freq: Every day | ORAL | 2 refills | Status: DC

## 2021-12-30 MED ORDER — AMLODIPINE BESYLATE 10 MG PO TABS
10.0000 mg | ORAL_TABLET | Freq: Every day | ORAL | 2 refills | Status: DC
  Filled 2021-12-30: qty 30, 30d supply, fill #0

## 2021-12-30 MED ORDER — THIAMINE HCL 100 MG PO TABS: 100.0000 mg | ORAL_TABLET | Freq: Every day | ORAL | Status: DC

## 2021-12-30 MED ORDER — LABETALOL HCL 100 MG PO TABS: 100.0000 mg | ORAL_TABLET | Freq: Two times a day (BID) | ORAL | 2 refills | Status: DC

## 2021-12-30 MED ORDER — FOLIC ACID 1 MG PO TABS: 1.0000 mg | ORAL_TABLET | Freq: Every day | ORAL | Status: DC

## 2021-12-30 MED ORDER — AMLODIPINE BESYLATE 10 MG PO TABS: 10.0000 mg | ORAL_TABLET | Freq: Every day | ORAL | 2 refills | Status: DC

## 2021-12-30 MED ORDER — CHLORTHALIDONE 25 MG PO TABS
25.0000 mg | ORAL_TABLET | Freq: Every day | ORAL | Status: DC
  Administered 2021-12-30: 25 mg via ORAL
  Filled 2021-12-30: qty 1

## 2021-12-30 MED ORDER — FOLIC ACID 1 MG PO TABS
1.0000 mg | ORAL_TABLET | Freq: Every day | ORAL | 2 refills | Status: AC
  Filled 2021-12-30: qty 30, 30d supply, fill #0

## 2021-12-30 MED ORDER — HYDRALAZINE HCL 100 MG PO TABS
100.0000 mg | ORAL_TABLET | Freq: Three times a day (TID) | ORAL | 2 refills | Status: DC
  Filled 2021-12-30: qty 90, 30d supply, fill #0

## 2021-12-30 MED ORDER — CHLORTHALIDONE 25 MG PO TABS
25.0000 mg | ORAL_TABLET | Freq: Every day | ORAL | 2 refills | Status: DC
  Filled 2021-12-30: qty 30, 30d supply, fill #0

## 2021-12-30 MED ORDER — LABETALOL HCL 100 MG PO TABS
100.0000 mg | ORAL_TABLET | Freq: Two times a day (BID) | ORAL | 2 refills | Status: DC
  Filled 2021-12-30: qty 60, 30d supply, fill #0

## 2021-12-30 MED ORDER — HYDRALAZINE HCL 100 MG PO TABS: 100.0000 mg | ORAL_TABLET | Freq: Three times a day (TID) | ORAL | 2 refills | Status: DC

## 2021-12-30 MED ORDER — CHLORDIAZEPOXIDE HCL 25 MG PO CAPS: ORAL_CAPSULE | ORAL | 0 refills | Status: DC

## 2021-12-30 MED ORDER — LABETALOL HCL 100 MG PO TABS
100.0000 mg | ORAL_TABLET | Freq: Two times a day (BID) | ORAL | Status: DC
  Administered 2021-12-30: 100 mg via ORAL
  Filled 2021-12-30: qty 1

## 2021-12-30 MED ORDER — LOSARTAN POTASSIUM 100 MG PO TABS: 100.0000 mg | ORAL_TABLET | Freq: Every day | ORAL | 2 refills | Status: DC

## 2021-12-30 MED ORDER — ADULT MULTIVITAMIN W/MINERALS CH: 1.0000 | ORAL_TABLET | Freq: Every day | ORAL | Status: DC

## 2021-12-30 MED ORDER — NICOTINE 21 MG/24HR TD PT24
21.0000 mg | MEDICATED_PATCH | Freq: Every day | TRANSDERMAL | 0 refills | Status: DC
Start: 2021-12-31 — End: 2024-04-22

## 2021-12-30 MED ORDER — LOSARTAN POTASSIUM 100 MG PO TABS
100.0000 mg | ORAL_TABLET | Freq: Every day | ORAL | 2 refills | Status: DC
  Filled 2021-12-30: qty 30, 30d supply, fill #0

## 2021-12-30 MED ORDER — CHLORDIAZEPOXIDE HCL 25 MG PO CAPS
ORAL_CAPSULE | ORAL | 0 refills | Status: DC
  Filled 2021-12-30: qty 5, fill #0

## 2021-12-30 NOTE — Discharge Summary (Signed)
Physician Discharge Summary   OCONNOR ERKER  male DOB: [birth year]  ZOX:[id number]  PCP: Pcp, No  Admit date: 12/25/2021 Discharge date: 12/30/2021  Admitted From: home Disposition:  home Wife updated on the phone about discharge plans.  Rx sent to both local pharm and employee pharm (free medications) CODE STATUS: Full code  Discharge Instructions     Discharge instructions   Complete by: As directed    Please finish Librium taper at home as directed, for your alcohol withdrawal.  I have refilled your home blood pressure medications, and added a new one called labetalol.  Please monitor your blood pressure at home and follow up with PCP for further management.   Dr. Darlin Priestly [facility] Course:  For full details, please see H&P, progress notes, consult notes and ancillary notes.  Briefly,  Justin Moore is a 56 y.o. male with medical history significant of alcohol abuse, tobacco abuse, essential hypertension, previous detoxification that has failed who presented to the ER now with restlessness, irritability and confusion. Patient is diagnosed with alcohol withdrawal, placed on CIWA.   Alcohol withdrawal Chronic alcohol use disorder. Alcoholic hepatitis. --Pt received ativan while on CIWA and was started on librium.  Pt was back to baseline mental status prior to discharge.  Pt will finish librium taper at home as directed. --cont folic acid and thiamine  Hypertension emergency. --likely non-compliant at home --cont amlodipine, chlorthalidone, hydralazine and losartan.   --added labetalol 100 mg BID   Hypokalemia. Hypomagnesemia. --monitored and repleted PRN   Obesity with a BMI 31.93. Diet and exercise advised   Thrombocytopenia. Secondary to alcohol drinking.     Discharge Diagnoses:  Principal Problem:   Alcohol withdrawal (HCC) Active Problems:   Essential hypertension   Smoking   Leukocytosis   Alcoholic hepatitis without ascites    Alcohol abuse   Alcohol withdrawal delirium, acute, hyperactive (HCC)   Thrombocytopenia (HCC)   Hypokalemia   Hypertensive emergency   30 Day Unplanned Readmission Risk Score    Flowsheet Row ED to Hosp-Admission (Current) from 12/25/2021 in [facility]  30 Day Unplanned Readmission Risk Score (%) 8.98 Filed at 12/30/2021 1200       This score is the patient's risk of an unplanned readmission within 30 days of being discharged (0 -100%). The score is based on dignosis, age, lab data, medications, orders, and past utilization.   Low:  0-14.9   Medium: 15-21.9   High: 22-29.9   Extreme: 30 and above         Discharge Instructions:  Allergies as of 12/30/2021   No Known Allergies      Medication List     STOP taking these medications    MELATONIN ER PO       TAKE these medications    amLODipine 10 MG tablet Commonly known as: NORVASC Take 1 tablet (10 mg total) by mouth daily.   Blood Pressure Monitor Kit 1 kit by Does not apply route daily.   chlordiazePOXIDE 25 MG capsule Commonly known as: LIBRIUM Take 1 capsule afternoon and night on 12/30/21, then 1 capsule morning and night on 12/31/21, then 1 capsule morning on 01/01/22, then done.   chlorthalidone 25 MG tablet Commonly known as: HYGROTON Take 1 tablet (25 mg total) by mouth daily.   folic acid 1 MG tablet Commonly known as: FOLVITE Take 1 tablet (1 mg total) by mouth daily. Start taking on: December 31, 2021   hydrALAZINE 100 MG tablet Commonly known as: APRESOLINE Take 1 tablet (100 mg total) by mouth 3 (three) times daily. What changed: when to take this   labetalol 100 MG tablet Commonly known as: NORMODYNE Take 1 tablet (100 mg total) by mouth 2 (two) times daily.   losartan 100 MG tablet Commonly known as: COZAAR Take 1 tablet (100 mg total) by mouth daily.   multivitamin with minerals Tabs tablet Take 1 tablet by mouth daily. Start taking on: December 31, 2021    nicotine 21 mg/24hr patch Commonly known as: NICODERM CQ - dosed in mg/24 hours Place 1 patch (21 mg total) onto the skin daily. Start taking on: December 31, 2021   thiamine 100 MG tablet Take 1 tablet (100 mg total) by mouth daily. Start taking on: December 31, 2021               Durable Medical Equipment  (From admission, onward)           Start     Ordered   12/30/21 1132  For home use only DME Walker rolling  Once       Question Answer Comment  Walker: With 5 Inch Wheels   Patient needs a walker to treat with the following condition Weakness      12/30/21 1132              No Known Allergies   The results of significant diagnostics from this hospitalization (including imaging, microbiology, ancillary and laboratory) are listed below for reference.   Consultations:   Procedures/Studies: No results found.    Labs: BNP (last 3 results) No results for input(s): "BNP" in the last 8760 hours. Basic Metabolic Panel: Recent Labs  Lab 12/25/21 1603 12/26/21 0154 12/26/21 0559 12/27/21 0459 12/28/21 0404 12/29/21 0507  NA 138  --  135 135 137 135  K 3.1*  --  3.0* 3.5 3.5 3.8  CL 97*  --  99 103 107 105  CO2 23  --  22 21* 24 23  GLUCOSE 146*  --  150* 128* 119* 129*  BUN 14  --  11 11 12 13   CREATININE 0.96 0.89 0.85 0.94 0.91 0.84  CALCIUM 9.6  --  9.3 10.1 9.8 9.8  MG  --  1.9  --  1.6* 2.1 2.0  PHOS  --   --   --  2.3* 3.3 3.7   Liver Function Tests: Recent Labs  Lab 12/25/21 1603 12/26/21 0559  AST 256* 209*  ALT 191* 168*  ALKPHOS 83 77  BILITOT 1.7* 2.2*  PROT 8.3* 7.5  ALBUMIN 4.6 4.1   No results for input(s): "LIPASE", "AMYLASE" in the last 168 hours. No results for input(s): "AMMONIA" in the last 168 hours. CBC: Recent Labs  Lab 12/25/21 1603 12/26/21 0154 12/26/21 0559 12/28/21 0404 12/29/21 0507  WBC 5.5 7.0 7.5 7.0 7.1  HGB 14.6 14.5 14.3 13.2 13.4  HCT 39.4 39.0 38.7* 36.4* 37.1*  MCV 89.5 90.5 91.1 91.5 91.8  PLT  85* 74* 69* 68* 86*   Cardiac Enzymes: No results for input(s): "CKTOTAL", "CKMB", "CKMBINDEX", "TROPONINI" in the last 168 hours. BNP: Invalid input(s): "POCBNP" CBG: Recent Labs  Lab 12/26/21 1433  GLUCAP 179*   D-Dimer No results for input(s): "DDIMER" in the last 72 hours. Hgb A1c No results for input(s): "HGBA1C" in the last 72 hours. Lipid Profile No results for input(s): "CHOL", "HDL", "LDLCALC", "TRIG", "CHOLHDL", "LDLDIRECT" in the last 72  hours. Thyroid function studies No results for input(s): "TSH", "T4TOTAL", "T3FREE", "THYROIDAB" in the last 72 hours.  Invalid input(s): "FREET3" Anemia work up No results for input(s): "VITAMINB12", "FOLATE", "FERRITIN", "TIBC", "IRON", "RETICCTPCT" in the last 72 hours. Urinalysis    Component Value Date/Time   APPEARANCEUR Clear 12/04/2019 1212   LABSPEC 1.025 08/25/2017 1010   PHURINE 6.0 08/25/2017 1010   GLUCOSEU Negative 12/04/2019 1212   HGBUR NEGATIVE 08/25/2017 1010   BILIRUBINUR Negative 05/25/2020 1124   BILIRUBINUR Negative 12/04/2019 1212   KETONESUR TRACE (A) 08/25/2017 1010   PROTEINUR Positive (A) 05/25/2020 1124   PROTEINUR Negative 12/04/2019 1212   PROTEINUR 100 (A) 08/25/2017 1010   UROBILINOGEN 0.2 05/25/2020 1124   UROBILINOGEN 2.0 (H) 08/25/2017 1010   NITRITE Negative 05/25/2020 1124   NITRITE Negative 12/04/2019 1212   NITRITE NEGATIVE 08/25/2017 1010   LEUKOCYTESUR Negative 05/25/2020 1124   LEUKOCYTESUR Negative 12/04/2019 1212   Sepsis Labs Recent Labs  Lab 12/26/21 0154 12/26/21 0559 12/28/21 0404 12/29/21 0507  WBC 7.0 7.5 7.0 7.1   Microbiology Recent Results (from the past 240 hour(s))  MRSA Next Gen by PCR, Nasal     Status: None   Collection Time: 12/26/21  2:36 PM   Specimen: Nasal Mucosa; Nasal Swab  Result Value Ref Range Status   MRSA by PCR Next Gen NOT DETECTED NOT DETECTED Final    Comment: (NOTE) The GeneXpert MRSA Assay (FDA approved for NASAL specimens only), is  one component of a comprehensive MRSA colonization surveillance program. It is not intended to diagnose MRSA infection nor to guide or monitor treatment for MRSA infections. Test performance is not FDA approved in patients less than 60 years old. Performed at [facility], [address] [place], [place] [address]      Total time spend on discharging this patient, including the last patient exam, discussing the hospital stay, instructions for ongoing care as it relates to all pertinent caregivers, as well as preparing the medical discharge records, prescriptions, and/or referrals as applicable, is 50 minutes.    Darlin Priestly, MD  [facility] 12/30/2021, 2:12 PM

## 2021-12-30 NOTE — TOC Transition Note (Signed)
Transition of Care [facility]) - CM/SW Discharge Note   Patient Details  Name: Justin Moore MRN: [id number] Date of Birth: Apr 01, 1966  [facility]) CM/SW Contact:  Margarito Liner, LCSW Phone Number: 12/30/2021, 2:57 PM   Clinical Narrative:   Patient has orders to discharge home today. [facility]. Sent GoodRx coupon for Librium to his cell phone which [facility] will [facility]. Patient left before this could be arranged so he will go pick up prescriptions when ready. [employer] will ship RW to his home. No further concerns. CSW signing off.  Final next level of care: Home/Self Care Barriers to Discharge: No Barriers Identified   Patient Goals and CMS Choice        Discharge Placement                Patient to be transferred to facility by: Wife   Patient and family notified of of transfer: 12/30/21  Discharge Plan and Services                DME Arranged: Dan Humphreys rolling DME Agency: [employer] Date DME Agency Contacted: 12/30/21   Representative spoke with at DME Agency: Bjorn Loser            Social Determinants of Health (SDOH) Interventions     Readmission Risk Interventions     No data to display

## 2021-12-30 NOTE — Progress Notes (Signed)
Pt discharged per MD order. IV removed. Discharge instructions reviewed with pt. Pt verbalized understanding. All questions answered to pt satisfaction. Pt taken to car in wheelchair by volunteer.  

## 2021-12-30 NOTE — Progress Notes (Signed)
Mobility Specialist - Progress Note   12/30/21 1300  Mobility  Activity Ambulated with assistance in hallway  Level of Assistance Standby assist, set-up cues, supervision of patient - no hands on  Assistive Device Front wheel walker;None  Distance Ambulated (ft) 160 ft  Activity Response Tolerated well  $Mobility charge 1 Mobility     Pt lying in bed upon arrival, utilizing RA. Pt ambulated in hallway with supervision. Initially using RW but then opted not to use AD. Still mildly unsteady without, but some improved balance without RW. Pt returned to chair with needs in reach, lunch tray in place.    Kathee Delton Mobility Specialist 12/30/21, 1:15 PM

## 2021-12-30 NOTE — Progress Notes (Signed)
Physical Therapy Treatment Patient Details Name: Justin Moore MRN: [id number] DOB: 1965-07-27 Today's Date: 12/30/2021   History of Present Illness Justin Moore is a 56yoM who comes to [facility] on 12/28/21 Moore signs of ETOH withdrawl pt reports he is trying to stop a daily drinking habit. Per SO some AMS and irritability. PMH: HTN.    PT Comments    Pt in bed watcing the tele, he walked around the unit earlier with mobility tech. He is agreeable to additional mobility training in preparation for DC to home today. Pt able to AMB down to chapel and back, some RW use, some railing in hall, some hands-free walking. Pt is working hard on trunk control, turing around remains most provocative to balance. Standing BP assessed post AMB, 130s systolic. HR monitored during AMB, is WNL.      Recommendations for follow up therapy are one component of a multi-disciplinary discharge planning process, led by the attending physician.  Recommendations may be updated based on patient status, additional functional criteria and insurance authorization.  Follow Up Recommendations  Home health PT     Assistance Recommended at Discharge Frequent or constant Supervision/Assistance  Patient can return home with the following A little help with bathing/dressing/bathroom;A lot of help with walking and/or transfers;Assistance with cooking/housework;Assist for transportation   Equipment Recommendations  Rolling walker (2 wheels)    Recommendations for Other Services       Precautions / Restrictions Precautions Precautions: Fall Restrictions Weight Bearing Restrictions: No     Mobility  Bed Mobility Overal bed mobility: Modified Independent Bed Mobility: Supine to Sit     Supine to sit: Modified independent (Device/Increase time)          Transfers Overall transfer level: Needs assistance Equipment used: Rolling walker (2 wheels) Transfers: Sit to/from Stand Sit to Stand: Supervision                 Ambulation/Gait   Gait Distance (Feet): 1200 Feet Assistive device: Rolling walker (2 wheels), None (railing) Gait Pattern/deviations: Staggering right, Staggering left       General Gait Details: AMB from 218 to chapel and back to room; uses a RW about 50% of first part of walk then railing in hallway, then AMB back to room without device, alternating railing or nothing at all, again lost of abnormal sway when turning around, but diffciult to tell how much of this is LOB and how much effort it takes for righting. still encouraged to AMB with RW for exercise and work on balance seperately. HR and BP well controlled throughout. Pt still somewhat tremulous, but looks like he feels much improved since previous day.   Stairs             Wheelchair Mobility    Modified Rankin (Stroke Patients Only)       Balance                                            Cognition Arousal/Alertness: Awake/alert Behavior During Therapy: WFL for tasks assessed/performed Overall Cognitive Status: Within Functional Limits for tasks assessed                                          Exercises      General Comments  Pertinent Vitals/Pain      Home Living                          Prior Function            PT Goals (current goals can now be found in the care plan section) Acute Rehab PT Goals Patient Stated Goal: regain strength and independence PT Goal Formulation: With patient Time For Goal Achievement: 01/12/22 Potential to Achieve Goals: Good Progress towards PT goals: Progressing toward goals    Frequency    Min 2X/week      PT Plan Current plan remains appropriate    Co-evaluation              AM-PAC PT "6 Clicks" Mobility   Outcome Measure  Help needed turning from your back to your side while in a flat bed without using bedrails?: A Little Help needed moving from lying on your back to sitting on the  side of a flat bed without using bedrails?: A Little Help needed moving to and from a bed to a chair (including a wheelchair)?: A Lot Help needed standing up from a chair using your arms (e.g., wheelchair or bedside chair)?: A Lot Help needed to walk in hospital room?: A Lot Help needed climbing 3-5 steps with a railing? : A Lot 6 Click Score: 14    End of Session Equipment Utilized During Treatment: Gait belt Activity Tolerance: Patient tolerated treatment well;No increased pain;Patient limited by fatigue Patient left: with call bell/phone within reach;in bed Nurse Communication: Mobility status PT Visit Diagnosis: Unsteadiness on feet (R26.81);Difficulty in walking, not elsewhere classified (R26.2);Muscle weakness (generalized) (M62.81);Other symptoms and signs involving the nervous system (R29.898)     Time: 9563-8756 PT Time Calculation (min) (ACUTE ONLY): 16 min  Charges:  $Therapeutic Exercise: 8-22 mins                    2:05 PM, 12/30/21 Rosamaria Lints, PT, DPT Physical Therapist - [facility]  [phone] (ASCOM)    Justin Moore 12/30/2021, 2:03 PM

## 2021-12-30 NOTE — TOC Initial Note (Signed)
Transition of Care [facility]) - Initial/Assessment Note    Patient Details  Name: Justin Moore MRN: [id number] Date of Birth: 1966/04/22  [facility] of Care [facility]) CM/SW Contact:    Donnelly Angelica, LCSW Phone Number: 12/30/2021, 12:13 PM  Clinical Narrative:                 PT recommended [place] however patient does not have a PCP. CSW provided patient with PCP resources in his area. CSW ordered patient a rolling walker for home.    Expected Discharge Plan: Home/Self Care Barriers to Discharge: Continued Medical Work up   Patient Goals and CMS Choice        Expected Discharge Plan and Services Expected Discharge Plan: Home/Self Care                         DME Arranged: Walker rolling DME Agency: [employer] Date DME Agency Contacted: 12/30/21   Representative spoke with at DME Agency: Suanne Marker            Prior Living Arrangements/Services     Patient language and need for interpreter reviewed:: Yes        Need for Family Participation in Patient Care: Yes (Comment) Care giver support system in place?: Yes (comment)   Criminal Activity/Legal Involvement Pertinent to Current Situation/Hospitalization: No - Comment as needed  Activities of Daily Living Home Assistive Devices/Equipment: None ADL Screening (condition at time of admission) Patient's cognitive ability adequate to safely complete daily activities?: Yes Is the patient deaf or have difficulty hearing?: No Does the patient have difficulty seeing, even when wearing glasses/contacts?: No Does the patient have difficulty concentrating, remembering, or making decisions?: No Patient able to express need for assistance with ADLs?: No Does the patient have difficulty dressing or bathing?: No Independently performs ADLs?: Yes (appropriate for developmental age) Does the patient have difficulty walking or climbing stairs?: No Weakness of Legs: Both Weakness of Arms/Hands: Both  Permission Sought/Granted                   Emotional Assessment Appearance:: Appears stated age Attitude/Demeanor/Rapport: Gracious, Engaged Affect (typically observed): Pleasant Orientation: : Oriented to Self, Oriented to Place, Oriented to  Time, Oriented to Situation Alcohol / Substance Use: Never Used Psych Involvement: No (comment)  Admission diagnosis:  Alcohol withdrawal ([place]) [F10.939] Alcohol abuse [F10.10] Alcohol withdrawal delirium, acute, hyperactive ([place]) [F10.931] Patient Active Problem List   Diagnosis Date Noted   Alcohol withdrawal delirium, acute, hyperactive ([place]) 12/26/2021   Thrombocytopenia ([place]) 12/26/2021   Hypokalemia 12/26/2021   Hypertensive emergency 12/26/2021   Alcohol withdrawal ([place]) [id number]   Alcoholic hepatitis without ascites 05/20/2021   Alcohol abuse 05/20/2021   Smoking 12/05/2019   Elevated lipids 12/05/2019   Leukocytosis 12/05/2019   Encounter to establish care 11/28/2019   Essential hypertension 11/28/2019   PCP:  Pcp, No [facility]acy[facility]   [id number] [address], [place] [address] Phone: [phone] Fax: [phone]     Social Determinants of Health (SDOH) Interventions    Readmission Risk Interventions     No data to display

## 2022-10-14 IMAGING — CT CT HEAD W/O CM
3 series · 15 of 47 positions shown, 18 images · non-contrast
Comparison: None.

CLINICAL DATA: Seizures

EXAM:
CT HEAD WITHOUT CONTRAST
TECHNIQUE: Contiguous axial images were obtained from the base of the skull
through the vertex without intravenous contrast.

[Series 3: head wo · axial · 0.41mm/px · z∈[+397,+522]mm · 9 of 30 slices shown, 12 images]
[im 3/30  brain]
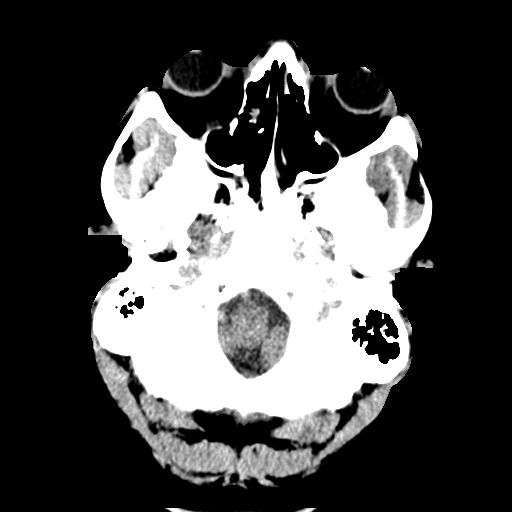
[im 3/30  bone]
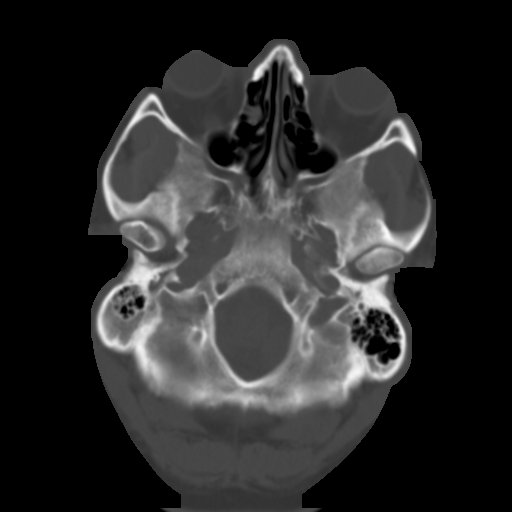
[im 6/30  brain]
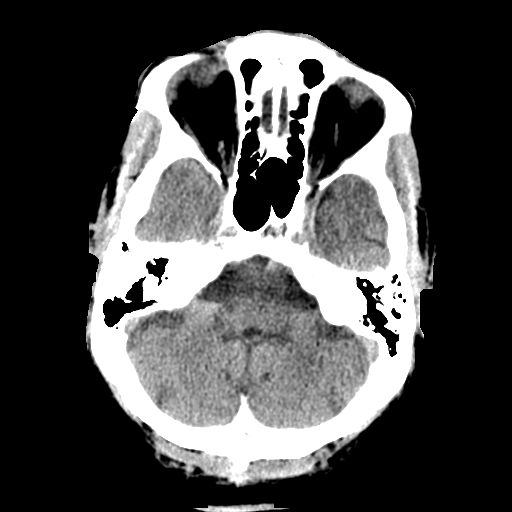
[im 9/30  brain]
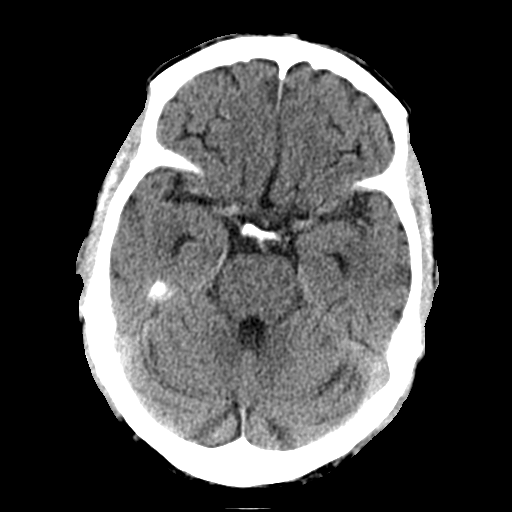
[im 12/30  brain]
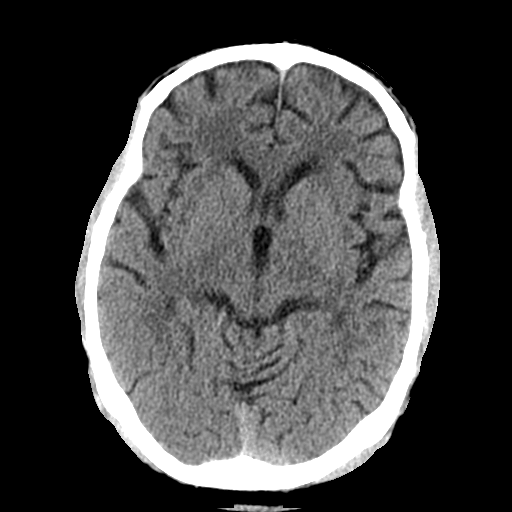
[im 16/30  brain]
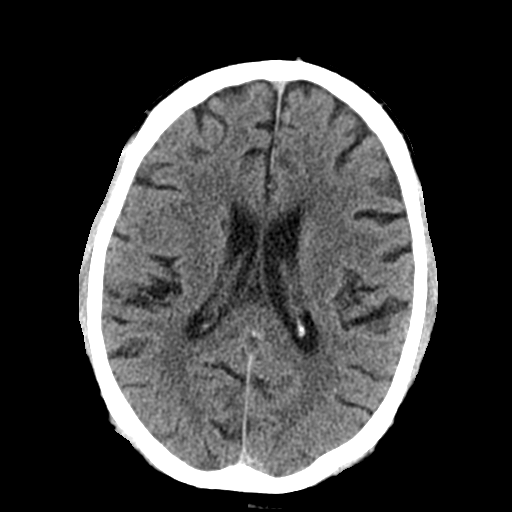
[im 16/30  bone]
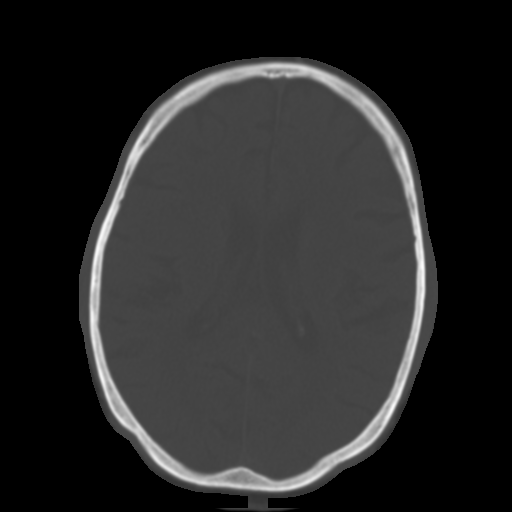
[im 19/30  brain]
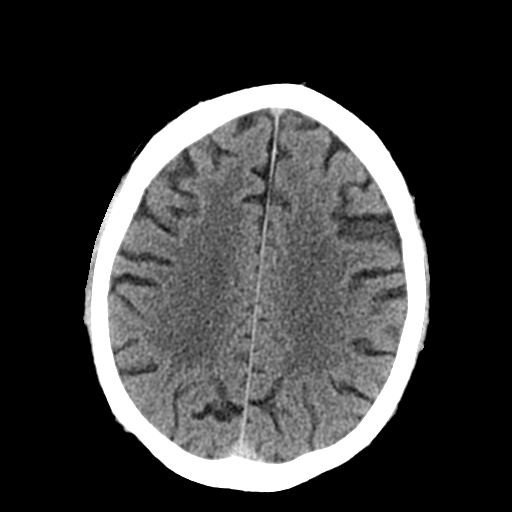
[im 22/30  brain]
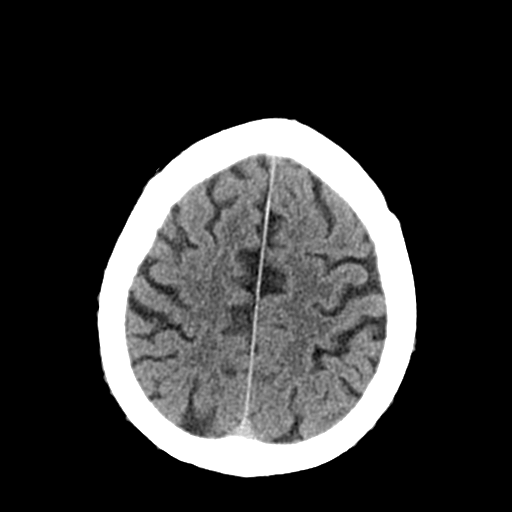
[im 25/30  brain]
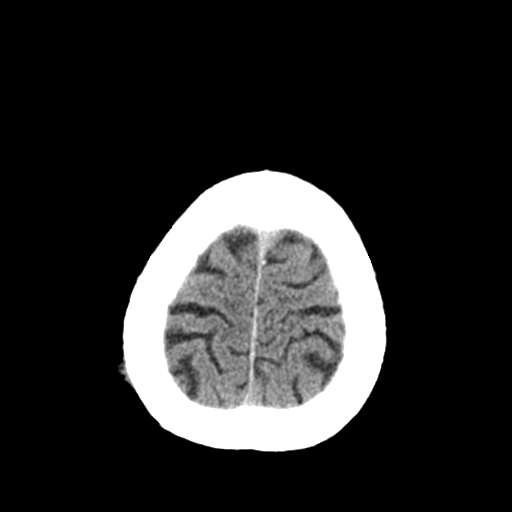
[im 28/30  brain]
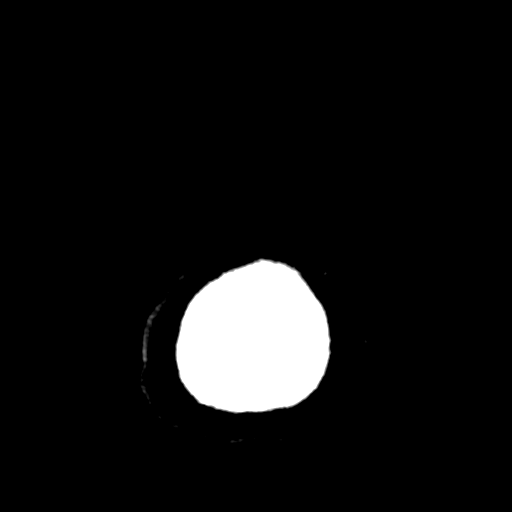
[im 28/30  bone]
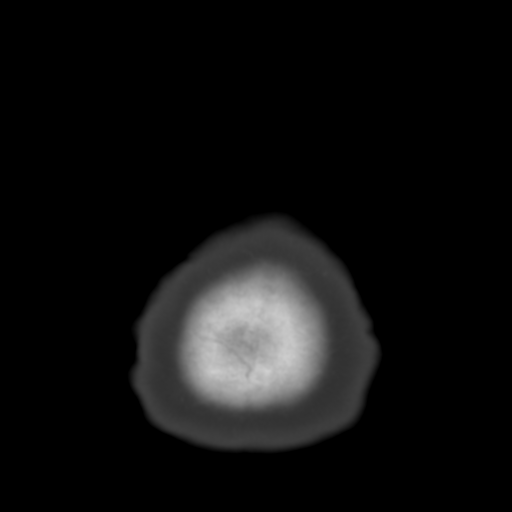

[Series 4: coronal soft tissue · coronal · 0.30mm/px · 3 of 67 slices shown]
[im 23/67  brain]
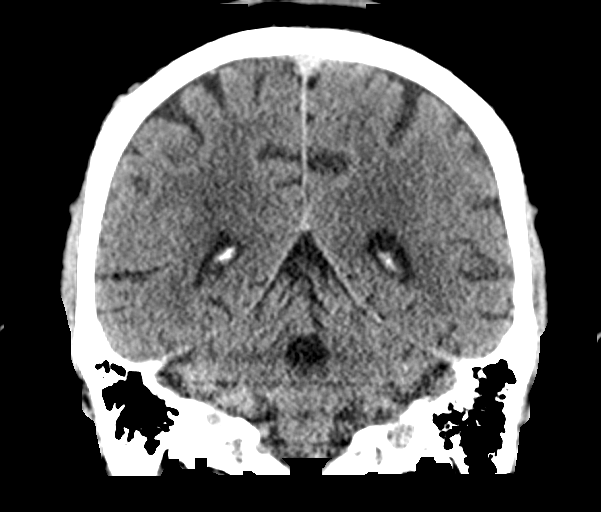
[im 30/67  brain]
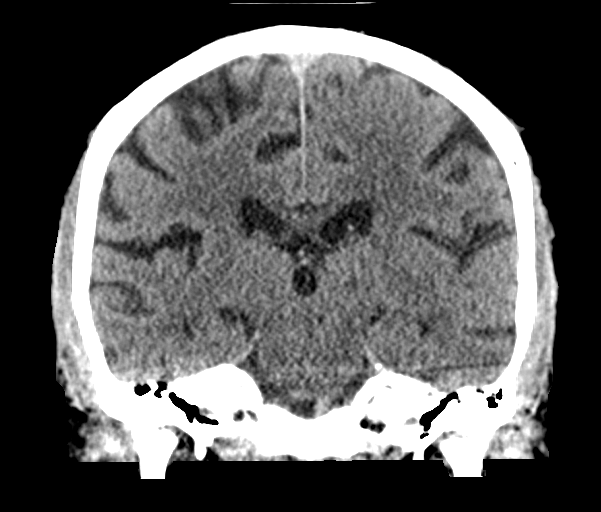
[im 37/67  brain]
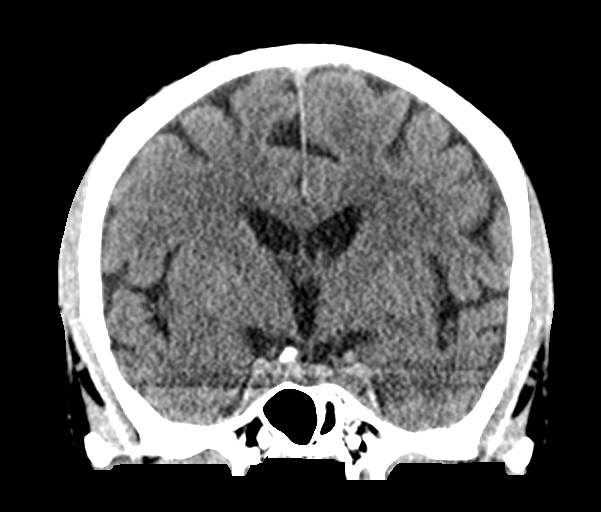

[Series 5: sagittal soft tissue · sagittal · 0.30mm/px · 3 of 62 slices shown]
[im 21/62  brain]
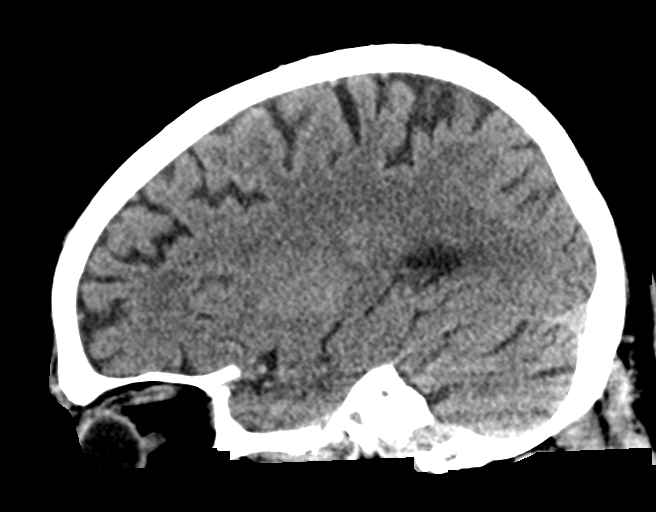
[im 31/62  brain]
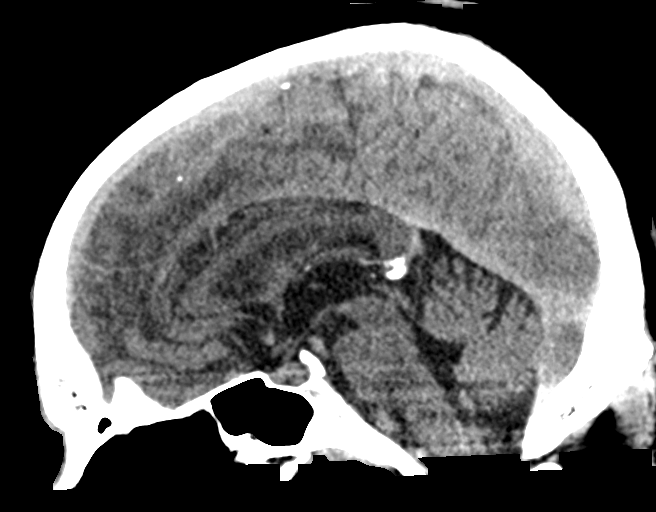
[im 41/62  brain]
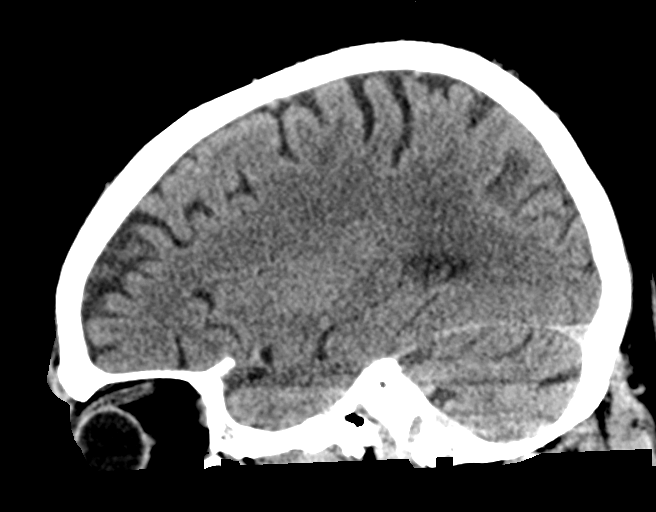

[15 of 47 positions shown; findings below may reference images not displayed]

FINDINGS: Brain: There are no signs of recent bleeding within the cranium.
There is no focal mass effect. Ventricles are not dilated. Cortical
sulci are prominent. There is decreased density in periventricular
white matter.

Vascular: Unremarkable.

Skull: Unremarkable

Sinuses/Orbits: There is mild mucosal thickening in the ethmoid
sinus

Other: None
IMPRESSION: No acute intracranial findings are seen in noncontrast CT brain.
Atrophy.

Reading location: [place], [place].

## 2022-10-25 ENCOUNTER — Emergency Department
Admission: EM | Admit: 2022-10-25 | Discharge: 2022-10-25 | Disposition: A | Discharge status: Home / Self Care | Payer: 59 | Attending: Emergency Medicine | Admitting: Emergency Medicine

## 2022-10-25 ENCOUNTER — Other Ambulatory Visit: Payer: Self-pay

## 2022-10-25 ENCOUNTER — Emergency Department: Payer: 59

## 2022-10-25 DIAGNOSIS — R0789 Other chest pain: Secondary | ICD-10-CM | POA: Insufficient documentation

## 2022-10-25 DIAGNOSIS — R1011 Right upper quadrant pain: Secondary | ICD-10-CM | POA: Diagnosis not present

## 2022-10-25 DIAGNOSIS — I10 Essential (primary) hypertension: Secondary | ICD-10-CM | POA: Insufficient documentation

## 2022-10-25 DIAGNOSIS — E876 Hypokalemia: Secondary | ICD-10-CM | POA: Diagnosis not present

## 2022-10-25 DIAGNOSIS — R0602 Shortness of breath: Secondary | ICD-10-CM | POA: Diagnosis not present

## 2022-10-25 DIAGNOSIS — J441 Chronic obstructive pulmonary disease with (acute) exacerbation: Secondary | ICD-10-CM | POA: Insufficient documentation

## 2022-10-25 DIAGNOSIS — F17210 Nicotine dependence, cigarettes, uncomplicated: Secondary | ICD-10-CM | POA: Insufficient documentation

## 2022-10-25 DIAGNOSIS — Z1152 Encounter for screening for COVID-19: Secondary | ICD-10-CM | POA: Insufficient documentation

## 2022-10-25 LAB — HEPATIC FUNCTION PANEL
ALT: 21 U/L (ref 0–44)
AST: 31 U/L (ref 15–41)
Albumin: 4.1 g/dL (ref 3.5–5.0)
Alkaline Phosphatase: 46 U/L (ref 38–126)
Bilirubin, Direct: <0.1 mg/dL (ref 0.0–0.2)
Total Bilirubin: 0.6 mg/dL (ref 0.3–1.2)
Total Protein: 7.4 g/dL (ref 6.5–8.1)

## 2022-10-25 LAB — BASIC METABOLIC PANEL
Anion gap: 11 (ref 5–15)
BUN: 20 mg/dL (ref 6–20)
CO2: 23 mmol/L (ref 22–32)
Calcium: 9.3 mg/dL (ref 8.9–10.3)
Chloride: 105 mmol/L (ref 98–111)
Creatinine, Ser: 1.66 mg/dL — ABNORMAL HIGH (ref 0.61–1.24)
GFR, Estimated: 49 mL/min — ABNORMAL LOW (ref >60)
Glucose, Bld: 160 mg/dL — ABNORMAL HIGH (ref 70–99)
Potassium: 3 mmol/L — ABNORMAL LOW (ref 3.5–5.1)
Sodium: 139 mmol/L (ref 135–145)

## 2022-10-25 LAB — RESP PANEL BY RT-PCR (RSV, FLU A&B, COVID)  RVPGX2
Influenza A by PCR: NEGATIVE
Influenza B by PCR: NEGATIVE
Resp Syncytial Virus by PCR: NEGATIVE
SARS Coronavirus 2 by RT PCR: NEGATIVE

## 2022-10-25 LAB — CBC
HCT: 39.2 % (ref 39.0–52.0)
Hemoglobin: 14.3 g/dL (ref 13.0–17.0)
MCH: 30.8 pg (ref 26.0–34.0)
MCHC: 36.5 g/dL — ABNORMAL HIGH (ref 30.0–36.0)
MCV: 84.5 fL (ref 80.0–100.0)
Platelets: 326 10*3/uL (ref 150–400)
RBC: 4.64 MIL/uL (ref 4.22–5.81)
RDW: 14 % (ref 11.5–15.5)
WBC: 11.7 10*3/uL — ABNORMAL HIGH (ref 4.0–10.5)
nRBC: 0 % (ref 0.0–0.2)

## 2022-10-25 LAB — TROPONIN I (HIGH SENSITIVITY)
Troponin I (High Sensitivity): 15 ng/L (ref <18)
Troponin I (High Sensitivity): 15 ng/L (ref <18)

## 2022-10-25 LAB — MAGNESIUM: Magnesium: 2.3 mg/dL (ref 1.7–2.4)

## 2022-10-25 MED ORDER — IPRATROPIUM-ALBUTEROL 0.5-2.5 (3) MG/3ML IN SOLN
3.0000 mL | Freq: Once | RESPIRATORY_TRACT | Status: AC
  Administered 2022-10-25: 3 mL via RESPIRATORY_TRACT
  Filled 2022-10-25: qty 3

## 2022-10-25 MED ORDER — METHYLPREDNISOLONE SODIUM SUCC 125 MG IJ SOLR
125.0000 mg | Freq: Once | INTRAMUSCULAR | Status: AC
  Administered 2022-10-25: 125 mg via INTRAVENOUS
  Filled 2022-10-25: qty 2

## 2022-10-25 MED ORDER — ALBUTEROL SULFATE HFA 108 (90 BASE) MCG/ACT IN AERS: 2.0000 | INHALATION_SPRAY | Freq: Four times a day (QID) | RESPIRATORY_TRACT | 2 refills | Status: DC | PRN

## 2022-10-25 MED ORDER — PREDNISONE 50 MG PO TABS: 50.0000 mg | ORAL_TABLET | Freq: Every day | ORAL | 0 refills | Status: AC

## 2022-10-25 MED ORDER — IPRATROPIUM-ALBUTEROL 0.5-2.5 (3) MG/3ML IN SOLN
6.0000 mL | Freq: Once | RESPIRATORY_TRACT | Status: AC
  Administered 2022-10-25: 6 mL via RESPIRATORY_TRACT
  Filled 2022-10-25: qty 3

## 2022-10-25 MED ORDER — IPRATROPIUM-ALBUTEROL 0.5-2.5 (3) MG/3ML IN SOLN
RESPIRATORY_TRACT | Status: AC
  Administered 2022-10-25: 3 mL via RESPIRATORY_TRACT
  Filled 2022-10-25: qty 3

## 2022-10-25 MED ORDER — POTASSIUM CHLORIDE CRYS ER 20 MEQ PO TBCR
40.0000 meq | EXTENDED_RELEASE_TABLET | Freq: Once | ORAL | Status: AC
  Administered 2022-10-25: 40 meq via ORAL
  Filled 2022-10-25: qty 2

## 2022-10-25 NOTE — ED Triage Notes (Signed)
Patient states he used a vape approximately two weeks ago and began coughing. States coughing continued and that he has also began having right chest pain for the past two weeks. Patient states he now has shortness of breath x 2 days. Denies nasal congestion fever or chills.

## 2022-10-25 NOTE — ED Provider Notes (Signed)
[facility] Provider Note    Event Date/Time   First MD Initiated Contact with Patient 10/25/22 0236     (approximate)   History   Chest Pain and Shortness of Breath   HPI  Justin Moore is a 57 y.o. male who presents to the ED for evaluation of Chest Pain and Shortness of Breath   I reviewed medical DC summary from June of last year.  History of alcohol abuse, tobacco abuse, HTN.  Admitted for alcohol withdrawals and alcoholic hepatitis.  Patient reports 1 PPD smoking as well  He presents to the ED for evaluation of chest discomfort and operative cough over the past couple weeks.  Reports right-sided lower chest pain associated with coughing, sneezing.  Reports some mild nausea.  No emesis.  No increased sputum production, fevers or syncope.   Physical Exam   Triage Vital Signs: ED Triage Vitals  Enc Vitals Group     BP 10/25/22 0050 (!) 180/125     Pulse Rate 10/25/22 0050 92     Resp 10/25/22 0050 19     Temp 10/25/22 0050 98.4 F (36.9 C)     Temp Source 10/25/22 0050 Oral     SpO2 10/25/22 0050 96 %     Weight 10/25/22 0106 245 lb (111.1 kg)     Height 10/25/22 0106 5\' 8"  (1.727 m)     Head Circumference --      Peak Flow --      Pain Score 10/25/22 0106 4     Pain Loc --      Pain Edu? --      Excl. in [place]? --     Most recent vital signs: Vitals:   10/25/22 0511 10/25/22 0530  BP:  (!) 181/109  Pulse:  73  Resp:    Temp: 98.3 F (36.8 C)   SpO2:  94%    General: Awake, no distress.  CV:  Good peripheral perfusion.  Resp:  Normal effort.  Wheezing throughout, no distress Abd:  No distention.  Mild RUQ tenderness, otherwise benign MSK:  No deformity noted.  Neuro:  No focal deficits appreciated. Other:     ED Results / Procedures / Treatments   Labs (all labs ordered are listed, but only abnormal results are displayed) Labs Reviewed  BASIC METABOLIC PANEL - Abnormal; Notable for the following components:       Result Value   Potassium 3.0 (*)    Glucose, Bld 160 (*)    Creatinine, Ser 1.66 (*)    GFR, Estimated 49 (*)    All other components within normal limits  CBC - Abnormal; Notable for the following components:   WBC 11.7 (*)    MCHC 36.5 (*)    All other components within normal limits  RESP PANEL BY RT-PCR (RSV, FLU A&B, COVID)  [id number]  HEPATIC FUNCTION PANEL  MAGNESIUM  TROPONIN I (HIGH SENSITIVITY)  TROPONIN I (HIGH SENSITIVITY)    EKG Sinus rhythm with a rate of 88 bpm.  Normal axis and intervals.  QTc 491.  No clear signs of acute ischemia.  RADIOLOGY CXR interpreted by me without evidence of acute cardiopulmonary pathology.  Official radiology report(s): US ABDOMEN LIMITED RUQ (LIVER/GB)  Result Date: 10/25/2022 CLINICAL DATA:  Right upper quadrant pain.  Alcohol abuse EXAM: ULTRASOUND ABDOMEN LIMITED RIGHT UPPER QUADRANT COMPARISON:  None Available. FINDINGS: Gallbladder: No gallstones or wall thickening visualized. No sonographic Murphy sign noted by sonographer. Common bile duct: Diameter:  2 mm Liver: No focal lesion identified. Within normal limits in parenchymal echogenicity. Borderline liver surface lobularity but no definitive cirrhosis. Portal vein is patent on color Doppler imaging with normal direction of blood flow towards the liver. IMPRESSION: No acute finding.  Normal gallbladder. Electronically Signed   By: Jorje Guild M.D.   On: 10/25/2022 05:41   DG Chest 2 View  Result Date: 10/25/2022 CLINICAL DATA:  Shortness of breath EXAM: CHEST - 2 VIEW COMPARISON:  05/20/2021 FINDINGS: The heart size and mediastinal contours are within normal limits. Both lungs are clear. The visualized skeletal structures are unremarkable. IMPRESSION: Normal study Electronically Signed   By: Rolm Baptise M.D.   On: 10/25/2022 01:38    PROCEDURES and INTERVENTIONS:  .1-3 Lead EKG Interpretation  Performed by: Vladimir Crofts, MD Authorized by: Vladimir Crofts, MD     Interpretation:  normal     ECG rate:  90   ECG rate assessment: normal     Rhythm: sinus rhythm     Ectopy: none     Conduction: normal     Medications  methylPREDNISolone sodium succinate (SOLU-MEDROL) 125 mg/2 mL injection 125 mg (125 mg Intravenous Given 10/25/22 0417)  ipratropium-albuterol (DUONEB) 0.5-2.5 (3) MG/3ML nebulizer solution 6 mL (6 mLs Nebulization Given 10/25/22 0417)  potassium chloride SA (KLOR-CON M) CR tablet 40 mEq (40 mEq Oral Given 10/25/22 0547)  ipratropium-albuterol (DUONEB) 0.5-2.5 (3) MG/3ML nebulizer solution 3 mL (3 mLs Nebulization Given 10/25/22 0546)     IMPRESSION / MDM / ASSESSMENT AND PLAN / ED COURSE  I reviewed the triage vital signs and the nursing notes.  Differential diagnosis includes, but is not limited to, cholelithiasis, hepatitis, pneumothorax, COPD exacerbation, pneumonia, ACS  {Patient presents with symptoms of an acute illness or injury that is potentially life-threatening.   57 year old smoker presents with chest discomfort, dyspnea nonproductive cough, suspected COPD exacerbation and suitable for outpatient management.  Wheezing and uncomfortable initially, quickly improving with breathing treatments.  Started on steroids.  Blood work with hypokalemia that is replaced orally.  2 negative troponins and negative viral swabs.  CBC with marginal leukocytosis but I doubt infectious etiology of his symptoms due to his clear CXR and lack of increased sputum production.  Due to his RUQ tenderness and right-sided basilar chest pain and RUQ ultrasound is obtained without acute features.  He is feeling much better after breathing treatments and while I considered observation admission for this patient, will discharge with steroid burst and close return precautions.  His increased creatinine from baseline is noted and he reports diarrhea for couple days.  He is tolerating oral intake without abdominal pain or history of ureterolithiasis.  We discussed the importance of oral  rehydration and creatinine recheck.  He expressed understanding and agreement  Clinical Course as of 10/25/22 0612  Tue Oct 25, 2022  0544 Reassessed.  Reports feeling better.  Reassessment with some mild residual wheezing. [DS]    Clinical Course User Index [DS] Vladimir Crofts, MD     FINAL CLINICAL IMPRESSION(S) / ED DIAGNOSES   Final diagnoses:  COPD exacerbation  Other chest pain  Hypokalemia     Rx / DC Orders   ED Discharge Orders          Ordered    predniSONE (DELTASONE) 50 MG tablet  Daily        10/25/22 0550    albuterol (VENTOLIN HFA) 108 (90 Base) MCG/ACT inhaler  Every 6 hours PRN  10/25/22 0550             Note:  This document was prepared using Dragon voice recognition software and may include unintentional dictation errors.   Vladimir Crofts, MD 10/25/22 [phone]

## 2022-10-25 NOTE — Discharge Instructions (Signed)
Please take the prednisone once daily for the next 5 days to help relax lung inflammation  Use albuterol as needed every 4-6 hours for any further wheezing and coughing  You should probably stop smoking  Return to the ED with worsening symptoms despite this

## 2022-10-26 ENCOUNTER — Other Ambulatory Visit: Payer: Self-pay

## 2022-10-26 ENCOUNTER — Emergency Department: Payer: 59

## 2022-10-26 ENCOUNTER — Emergency Department
Admission: EM | Admit: 2022-10-26 | Discharge: 2022-10-26 | Disposition: A | Discharge status: Home / Self Care | Payer: 59 | Attending: Emergency Medicine | Admitting: Emergency Medicine

## 2022-10-26 ENCOUNTER — Encounter: Payer: Self-pay | Admitting: *Deleted

## 2022-10-26 DIAGNOSIS — I10 Essential (primary) hypertension: Secondary | ICD-10-CM | POA: Diagnosis not present

## 2022-10-26 DIAGNOSIS — R079 Chest pain, unspecified: Secondary | ICD-10-CM

## 2022-10-26 DIAGNOSIS — N179 Acute kidney failure, unspecified: Secondary | ICD-10-CM | POA: Insufficient documentation

## 2022-10-26 DIAGNOSIS — R0789 Other chest pain: Secondary | ICD-10-CM | POA: Insufficient documentation

## 2022-10-26 DIAGNOSIS — F1410 Cocaine abuse, uncomplicated: Secondary | ICD-10-CM | POA: Diagnosis not present

## 2022-10-26 DIAGNOSIS — E876 Hypokalemia: Secondary | ICD-10-CM | POA: Diagnosis not present

## 2022-10-26 DIAGNOSIS — D72829 Elevated white blood cell count, unspecified: Secondary | ICD-10-CM | POA: Insufficient documentation

## 2022-10-26 DIAGNOSIS — Z79899 Other long term (current) drug therapy: Secondary | ICD-10-CM | POA: Insufficient documentation

## 2022-10-26 DIAGNOSIS — R0600 Dyspnea, unspecified: Secondary | ICD-10-CM | POA: Diagnosis not present

## 2022-10-26 DIAGNOSIS — R0902 Hypoxemia: Secondary | ICD-10-CM | POA: Diagnosis not present

## 2022-10-26 DIAGNOSIS — R457 State of emotional shock and stress, unspecified: Secondary | ICD-10-CM | POA: Diagnosis not present

## 2022-10-26 LAB — BASIC METABOLIC PANEL
Anion gap: 10 (ref 5–15)
BUN: 30 mg/dL — ABNORMAL HIGH (ref 6–20)
CO2: 23 mmol/L (ref 22–32)
Calcium: 9.3 mg/dL (ref 8.9–10.3)
Chloride: 107 mmol/L (ref 98–111)
Creatinine, Ser: 1.79 mg/dL — ABNORMAL HIGH (ref 0.61–1.24)
GFR, Estimated: 44 mL/min — ABNORMAL LOW (ref >60)
Glucose, Bld: 119 mg/dL — ABNORMAL HIGH (ref 70–99)
Potassium: 3 mmol/L — ABNORMAL LOW (ref 3.5–5.1)
Sodium: 140 mmol/L (ref 135–145)

## 2022-10-26 LAB — URINALYSIS, ROUTINE W REFLEX MICROSCOPIC
Bilirubin Urine: NEGATIVE
Glucose, UA: NEGATIVE mg/dL
Hgb urine dipstick: NEGATIVE
Ketones, ur: NEGATIVE mg/dL
Leukocytes,Ua: NEGATIVE
Nitrite: NEGATIVE
Protein, ur: NEGATIVE mg/dL
Specific Gravity, Urine: 1.01 (ref 1.005–1.030)
pH: 6 (ref 5.0–8.0)

## 2022-10-26 LAB — CBC
HCT: 39 % (ref 39.0–52.0)
Hemoglobin: 14.1 g/dL (ref 13.0–17.0)
MCH: 30.9 pg (ref 26.0–34.0)
MCHC: 36.2 g/dL — ABNORMAL HIGH (ref 30.0–36.0)
MCV: 85.5 fL (ref 80.0–100.0)
Platelets: 336 10*3/uL (ref 150–400)
RBC: 4.56 MIL/uL (ref 4.22–5.81)
RDW: 14 % (ref 11.5–15.5)
WBC: 16.2 10*3/uL — ABNORMAL HIGH (ref 4.0–10.5)
nRBC: 0 % (ref 0.0–0.2)

## 2022-10-26 LAB — URINE DRUG SCREEN, QUALITATIVE (ARMC ONLY)
Amphetamines, Ur Screen: NOT DETECTED
Barbiturates, Ur Screen: NOT DETECTED
Benzodiazepine, Ur Scrn: NOT DETECTED
Cannabinoid 50 Ng, Ur ~~LOC~~: NOT DETECTED
Cocaine Metabolite,Ur ~~LOC~~: POSITIVE — AB
MDMA (Ecstasy)Ur Screen: NOT DETECTED
Methadone Scn, Ur: NOT DETECTED
Opiate, Ur Screen: NOT DETECTED
Phencyclidine (PCP) Ur S: NOT DETECTED
Tricyclic, Ur Screen: NOT DETECTED

## 2022-10-26 LAB — MAGNESIUM: Magnesium: 2.4 mg/dL (ref 1.7–2.4)

## 2022-10-26 LAB — CK: Total CK: 693 U/L — ABNORMAL HIGH (ref 49–397)

## 2022-10-26 LAB — TROPONIN I (HIGH SENSITIVITY)
Troponin I (High Sensitivity): 19 ng/L — ABNORMAL HIGH (ref <18)
Troponin I (High Sensitivity): 23 ng/L — ABNORMAL HIGH (ref <18)

## 2022-10-26 MED ORDER — HYDRALAZINE HCL 20 MG/ML IJ SOLN
10.0000 mg | Freq: Once | INTRAMUSCULAR | Status: AC
  Administered 2022-10-26: 10 mg via INTRAVENOUS
  Filled 2022-10-26: qty 1

## 2022-10-26 MED ORDER — SODIUM CHLORIDE 0.9 % IV BOLUS (SEPSIS)
1000.0000 mL | Freq: Once | INTRAVENOUS | Status: AC
  Administered 2022-10-26: 1000 mL via INTRAVENOUS

## 2022-10-26 MED ORDER — LABETALOL HCL 100 MG PO TABS: 100.0000 mg | ORAL_TABLET | Freq: Two times a day (BID) | ORAL | 2 refills | Status: DC

## 2022-10-26 MED ORDER — LORAZEPAM 2 MG/ML IJ SOLN
1.0000 mg | Freq: Once | INTRAMUSCULAR | Status: AC
  Administered 2022-10-26: 1 mg via INTRAVENOUS
  Filled 2022-10-26: qty 1

## 2022-10-26 MED ORDER — HYDRALAZINE HCL 100 MG PO TABS: 100.0000 mg | ORAL_TABLET | Freq: Three times a day (TID) | ORAL | 2 refills | Status: DC

## 2022-10-26 MED ORDER — POTASSIUM CHLORIDE CRYS ER 20 MEQ PO TBCR
40.0000 meq | EXTENDED_RELEASE_TABLET | Freq: Once | ORAL | Status: AC
  Administered 2022-10-26: 40 meq via ORAL
  Filled 2022-10-26: qty 2

## 2022-10-26 MED ORDER — AMLODIPINE BESYLATE 10 MG PO TABS
10.0000 mg | ORAL_TABLET | Freq: Every day | ORAL | 2 refills | Status: DC
Start: 2022-10-26 — End: 2024-04-22

## 2022-10-26 MED ORDER — LOSARTAN POTASSIUM 100 MG PO TABS: 100.0000 mg | ORAL_TABLET | Freq: Every day | ORAL | 2 refills | Status: DC

## 2022-10-26 NOTE — ED Notes (Signed)
Pt eating food and to be discharged.

## 2022-10-26 NOTE — ED Notes (Signed)
Pt informed me need a urine sample still. Urinal at bedside. Pt stated "okay, okay"

## 2022-10-26 NOTE — ED Provider Notes (Signed)
[facility] Provider Note    Event Date/Time   First MD Initiated Contact with Patient 10/26/22 [phone]     (approximate)   History   Shortness of Breath   HPI  Justin Moore is a 57 y.o. male with history of hypertension, substance abuse who presents to the emergency department with chest tightness and shortness of breath that started shortly after smoking cocaine tonight.  He is very tearful because he states he was in remission and recently relapsed.  States he just lost his wife of 36 years 6 months ago.  No fevers, cough, calf tenderness or calf swelling.   History provided by patient.    Past Medical History:  Diagnosis Date   Hypertension     History reviewed. No pertinent surgical history.  MEDICATIONS:  Prior to Admission medications   Medication Sig Start Date End Date Taking? Authorizing Provider  albuterol (VENTOLIN HFA) 108 (90 Base) MCG/ACT inhaler Inhale 2 puffs into the lungs every 6 (six) hours as needed for wheezing or shortness of breath. 10/25/22   Vladimir Crofts, MD  amLODipine (NORVASC) 10 MG tablet Take 1 tablet (10 mg total) by mouth daily. 12/30/21 03/30/22  Enzo Bi, MD  Blood Pressure Monitor KIT 1 kit by Does not apply route daily. Patient not taking: Reported on 12/25/2021 11/28/19   Iloabachie, Chioma E, NP  chlordiazePOXIDE (LIBRIUM) 25 MG capsule Take 1 capsule afternoon and night on 12/30/21, then 1 capsule morning and night on 12/31/21, then 1 capsule morning on 01/01/22, then done. 12/30/21   Enzo Bi, MD  chlorthalidone (HYGROTON) 25 MG tablet Take 1 tablet (25 mg total) by mouth daily. 12/30/21 03/30/22  Enzo Bi, MD  hydrALAZINE (APRESOLINE) 100 MG tablet Take 1 tablet (100 mg total) by mouth 3 (three) times daily. 12/30/21 03/30/22  Enzo Bi, MD  labetalol (NORMODYNE) 100 MG tablet Take 1 tablet (100 mg total) by mouth 2 (two) times daily. 12/30/21 03/30/22  Enzo Bi, MD  losartan (COZAAR) 100 MG tablet Take 1 tablet (100 mg total) by  mouth daily. 12/30/21 03/30/22  Enzo Bi, MD  Multiple Vitamin (MULTIVITAMIN WITH MINERALS) TABS tablet Take 1 tablet by mouth daily. 12/31/21   Enzo Bi, MD  nicotine (NICODERM CQ - DOSED IN MG/24 HOURS) 21 mg/24hr patch Place 1 patch (21 mg total) onto the skin daily. 12/31/21   Enzo Bi, MD  predniSONE (DELTASONE) 50 MG tablet Take 1 tablet (50 mg total) by mouth daily for 5 days. 10/25/22 10/30/22  Vladimir Crofts, MD    Physical Exam   Triage Vital Signs: ED Triage Vitals  Enc Vitals Group     BP 10/26/22 0155 (!) 163/124     Pulse Rate 10/26/22 0155 89     Resp 10/26/22 0155 18     Temp 10/26/22 0155 98 F (36.7 C)     Temp Source 10/26/22 0155 Oral     SpO2 10/26/22 0155 97 %     Weight 10/26/22 0156 240 lb (108.9 kg)     Height 10/26/22 0156 5\' 8"  (1.727 m)     Head Circumference --      Peak Flow --      Pain Score 10/26/22 0156 0     Pain Loc --      Pain Edu? --      Excl. in [place]? --     Most recent vital signs: Vitals:   10/26/22 0545 10/26/22 0600  BP:  (!) 174/109  Pulse:  72  Resp:  13  Temp:    SpO2: 98% 98%    CONSTITUTIONAL: Alert, responds appropriately to questions.  Tearful, remorseful HEAD: Normocephalic, atraumatic EYES: Conjunctivae clear, pupils appear equal, sclera nonicteric ENT: normal nose; moist mucous membranes NECK: Supple, normal ROM CARD: RRR; S1 and S2 appreciated RESP: Normal chest excursion without splinting or tachypnea; breath sounds clear and equal bilaterally; no wheezes, no rhonchi, no rales, no hypoxia or respiratory distress, speaking full sentences ABD/GI: Non-distended; soft, non-tender, no rebound, no guarding, no peritoneal signs BACK: The back appears normal EXT: Normal ROM in all joints; no deformity noted, no edema, no calf tenderness or calf swelling SKIN: Normal color for age and race; warm; no rash on exposed skin NEURO: Moves all extremities equally, normal speech    ED Results / Procedures / Treatments   LABS: (all  labs ordered are listed, but only abnormal results are displayed) Labs Reviewed  BASIC METABOLIC PANEL - Abnormal; Notable for the following components:      Result Value   Potassium 3.0 (*)    Glucose, Bld 119 (*)    BUN 30 (*)    Creatinine, Ser 1.79 (*)    GFR, Estimated 44 (*)    All other components within normal limits  CBC - Abnormal; Notable for the following components:   WBC 16.2 (*)    MCHC 36.2 (*)    All other components within normal limits  CK - Abnormal; Notable for the following components:   Total CK 693 (*)    All other components within normal limits  TROPONIN I (HIGH SENSITIVITY) - Abnormal; Notable for the following components:   Troponin I (High Sensitivity) 19 (*)    All other components within normal limits  TROPONIN I (HIGH SENSITIVITY) - Abnormal; Notable for the following components:   Troponin I (High Sensitivity) 23 (*)    All other components within normal limits  MAGNESIUM  URINE DRUG SCREEN, QUALITATIVE ([facility] ONLY)  URINALYSIS, ROUTINE W REFLEX MICROSCOPIC     EKG:    Date: 10/26/2022 1:56 AM  Rate: 89  Rhythm: normal sinus rhythm  QRS Axis: normal  Intervals: normal  ST/T Wave abnormalities: normal  Conduction Disutrbances: none  Narrative Interpretation: Nonspecific T wave abnormalities diffusely, no change compared to previous EKG    RADIOLOGY: My personal review and interpretation of imaging: Chest x-ray clear.  I have personally reviewed all radiology reports.   DG Chest 2 View  Result Date: 10/26/2022 CLINICAL DATA:  Dyspnea EXAM: CHEST - 2 VIEW COMPARISON:  10/25/2022 FINDINGS: The heart size and mediastinal contours are within normal limits. Both lungs are clear. The visualized skeletal structures are unremarkable. IMPRESSION: No active cardiopulmonary disease. Electronically Signed   By: Fidela Salisbury M.D.   On: 10/26/2022 02:42     PROCEDURES:  Critical Care performed: No      .1-3 Lead EKG  Interpretation  Performed by: Prisilla Kocsis, Delice Bison, DO Authorized by: Adelia Baptista, Delice Bison, DO     Interpretation: normal     ECG rate:  78   ECG rate assessment: normal     Rhythm: sinus rhythm     Ectopy: none     Conduction: normal       IMPRESSION / MDM / ASSESSMENT AND PLAN / ED COURSE  I reviewed the triage vital signs and the nursing notes.    Patient here with chest pain and shortness of breath after smoking cocaine.  The patient is on the cardiac monitor  to evaluate for evidence of arrhythmia and/or significant heart rate changes.   DIFFERENTIAL DIAGNOSIS (includes but not limited to):   Coronary vasospasm, ACS, dissection, PE, CHF, pneumonia, pneumothorax   Patient's presentation is most consistent with acute presentation with potential threat to life or bodily function.   PLAN: Workup initiated from triage.  Patient has a leukocytosis of 16,000 but no infectious symptoms, no fever.  May be reactive.  Potassium slightly low at 3.0.  No EKG changes.  Will give oral replacement and check magnesium level given history of substance abuse.  First troponin 19.  Will obtain second.  Chest x-ray reviewed and interpreted by myself and the radiologist is clear.  No cardiomegaly or widened mediastinum.  Will give Ativan for symptomatic relief.  Creatinine is elevated today which was present during last visit.  Will aggressively hydrate patient today.  Will check CK level.   MEDICATIONS GIVEN IN ED: Medications  sodium chloride 0.9 % bolus 1,000 mL (1,000 mLs Intravenous New Bag/Given 10/26/22 0419)  sodium chloride 0.9 % bolus 1,000 mL (1,000 mLs Intravenous New Bag/Given 10/26/22 0419)  potassium chloride SA (KLOR-CON M) CR tablet 40 mEq (40 mEq Oral Given 10/26/22 0419)  LORazepam (ATIVAN) injection 1 mg (1 mg Intravenous Given 10/26/22 0419)  hydrALAZINE (APRESOLINE) injection 10 mg (10 mg Intravenous Given 10/26/22 [id number])     ED COURSE: Second troponin has risen minimally but essentially is  flat.  Patient is sleeping and will need to be monitored until clinically sober.  Blood pressure still elevated.  Will give hydralazine and continue to monitor.   Signed out to oncoming EDP at 7 AM.   CONSULTS: Pending further workup   OUTSIDE RECORDS REVIEWED: Reviewed patient's last admission in June 2023.       FINAL CLINICAL IMPRESSION(S) / ED DIAGNOSES   Final diagnoses:  Chest pain, unspecified type  Cocaine abuse  AKI (acute kidney injury)  Hypokalemia  Uncontrolled hypertension     Rx / DC Orders   ED Discharge Orders          Ordered    Ambulatory Referral to Primary Care (Establish Care)        10/26/22 0550             Note:  This document was prepared using Dragon voice recognition software and may include unintentional dictation errors.   Kenzey Birkland, Delice Bison, DO 10/26/22 [id number]

## 2022-10-26 NOTE — ED Triage Notes (Signed)
Pt brought in via ems with sob.  No chest pain.  Pt reports smoking cocaine tonight and states it feels like something else was in it.  Pt alert.

## 2022-10-26 NOTE — ED Notes (Signed)
Pt being rude stating "you are fucking rude, sticking that down my throat and not feeding me." RN gave pt meal tray and apple juice over an hour ago and was getting discharge vital, (temperature where he stated I was sticking it down his throat) Pt then started to say I am a fucking white man and a dog that is racist.

## 2022-10-26 NOTE — ED Notes (Signed)
RN went into room and woke pt up. Pt informed we still need a urine sample

## 2022-10-26 NOTE — ED Notes (Signed)
Charge RN made aware of situation and Tamesha, RN stated she will discharge pt, as this RN does not feel comfortable/safe due to patients comments including "I see you, I see you, fucking white Man rude as fuck."

## 2022-10-26 NOTE — ED Notes (Signed)
Pt has urine specimen cup and RN provided pt with a urinal. Pt reminded that we need a urine sample

## 2022-10-26 NOTE — ED Provider Notes (Addendum)
I signed out this patient pending completion of workup, awaiting urinalysis.  He is hypertensive in the setting of cocaine use.  Troponins flat.  Resting comfortably.  Plan will be to check UA and discharge, referral to primary care has been placed.  Patient is awake and comfortable.  He offers no complaints at this time, asks for a work note.  Plan is for discharge.   Lucillie Garfinkel, MD 10/26/22 [id number]    Lucillie Garfinkel, MD 10/26/22 1014

## 2022-10-26 NOTE — ED Notes (Addendum)
RN went into room. Pt had urine specimen cup at bedside. Pt urinated on floor and into the trash can. Pt informed me need a urine sample, and cup was placed next to patient. Water and juice provided. EDP notified. + Pt states coming in for shortness of breath.

## 2022-10-31 ENCOUNTER — Encounter: Payer: Self-pay | Admitting: Emergency Medicine

## 2022-10-31 ENCOUNTER — Emergency Department
Admission: EM | Admit: 2022-10-31 | Discharge: 2022-10-31 | Disposition: A | Discharge status: Home / Self Care | Payer: 59 | Attending: Emergency Medicine | Admitting: Emergency Medicine

## 2022-10-31 ENCOUNTER — Emergency Department: Payer: 59

## 2022-10-31 DIAGNOSIS — W1842XA Slipping, tripping and stumbling without falling due to stepping into hole or opening, initial encounter: Secondary | ICD-10-CM | POA: Diagnosis not present

## 2022-10-31 DIAGNOSIS — I10 Essential (primary) hypertension: Secondary | ICD-10-CM | POA: Diagnosis not present

## 2022-10-31 DIAGNOSIS — Z79899 Other long term (current) drug therapy: Secondary | ICD-10-CM | POA: Diagnosis not present

## 2022-10-31 DIAGNOSIS — R519 Headache, unspecified: Secondary | ICD-10-CM

## 2022-10-31 DIAGNOSIS — S8392XA Sprain of unspecified site of left knee, initial encounter: Secondary | ICD-10-CM | POA: Insufficient documentation

## 2022-10-31 DIAGNOSIS — S8992XA Unspecified injury of left lower leg, initial encounter: Secondary | ICD-10-CM | POA: Diagnosis not present

## 2022-10-31 DIAGNOSIS — M25562 Pain in left knee: Secondary | ICD-10-CM | POA: Diagnosis not present

## 2022-10-31 MED ORDER — SODIUM CHLORIDE 0.9 % IV BOLUS
500.0000 mL | Freq: Once | INTRAVENOUS | Status: AC
  Administered 2022-10-31: 500 mL via INTRAVENOUS

## 2022-10-31 MED ORDER — LABETALOL HCL 5 MG/ML IV SOLN
10.0000 mg | Freq: Once | INTRAVENOUS | Status: AC
  Administered 2022-10-31: 10 mg via INTRAVENOUS
  Filled 2022-10-31: qty 4

## 2022-10-31 MED ORDER — METOCLOPRAMIDE HCL 5 MG/ML IJ SOLN
5.0000 mg | Freq: Once | INTRAMUSCULAR | Status: AC
  Administered 2022-10-31: 5 mg via INTRAVENOUS
  Filled 2022-10-31: qty 2

## 2022-10-31 MED ORDER — BUTALBITAL-APAP-CAFFEINE 50-325-40 MG PO TABS: 1.0000 | ORAL_TABLET | ORAL | 0 refills | Status: DC | PRN

## 2022-10-31 MED ORDER — DEXAMETHASONE SODIUM PHOSPHATE 10 MG/ML IJ SOLN
10.0000 mg | Freq: Once | INTRAMUSCULAR | Status: AC
  Administered 2022-10-31: 10 mg via INTRAVENOUS
  Filled 2022-10-31: qty 1

## 2022-10-31 MED ORDER — DIPHENHYDRAMINE HCL 50 MG/ML IJ SOLN
25.0000 mg | Freq: Once | INTRAMUSCULAR | Status: AC
  Administered 2022-10-31: 25 mg via INTRAVENOUS
  Filled 2022-10-31: qty 1

## 2022-10-31 NOTE — ED Notes (Signed)
Patient transported to CT 

## 2022-10-31 NOTE — ED Notes (Signed)
Pt brought from ED lobby, where he was sleeping on a bench, to ED Room 3. Warm blankets given to pt after getting into the bed. Pt wanted only one bed rail up. One bed rail raised per pt request. Pt did not want this EDT connecting monitoring devices. Pt asked for some "hospital socks", which he was then provided. Call light within reach and pt was instructed on its use. Pt denied needing anything else at this time. RN notified of pts presence.

## 2022-10-31 NOTE — ED Provider Notes (Incomplete)
[facility] Provider Note    Event Date/Time   First MD Initiated Contact with Patient 10/31/22 [phone]     (approximate)   History   Headache and Knee Injury   HPI  Justin Moore is a 56 y.o. male who presents to the ED from home with a chief complaint of headache and left knee injury.  Patient reports headache across the front of his head for the past several days with pressure behind his eyes x 1 day.  Endorses sinus congestion.  States he stepped into a ditch yesterday and twisted his left knee.  Denies fever/chills, vision changes, neck pain, chest pain, shortness of breath, abdominal pain, nausea, vomiting or dizziness.     Past Medical History   Past Medical History:  Diagnosis Date  . Hypertension      Active Problem List   Patient Active Problem List   Diagnosis Date Noted  . Alcohol withdrawal delirium, acute, hyperactive 12/26/2021  . Thrombocytopenia 12/26/2021  . Hypokalemia 12/26/2021  . Hypertensive emergency 12/26/2021  . Alcohol withdrawal 12/26/2021  . Alcoholic hepatitis without ascites 05/20/2021  . Alcohol abuse 05/20/2021  . Smoking 12/05/2019  . Elevated lipids 12/05/2019  . Leukocytosis 12/05/2019  . Encounter to establish care 11/28/2019  . Essential hypertension 11/28/2019     Past Surgical History  History reviewed. No pertinent surgical history.   Home Medications   Prior to Admission medications   Medication Sig Start Date End Date Taking? Authorizing Provider  butalbital-acetaminophen-caffeine (FIORICET) 50-325-40 MG tablet Take 1 tablet by mouth every 4 (four) hours as needed for headache. 10/31/22  Yes Irean Hong, MD  albuterol (VENTOLIN HFA) 108 (90 Base) MCG/ACT inhaler Inhale 2 puffs into the lungs every 6 (six) hours as needed for wheezing or shortness of breath. 10/25/22   Delton Prairie, MD  amLODipine (NORVASC) 10 MG tablet Take 1 tablet (10 mg total) by mouth daily. 10/26/22 01/24/23  Pilar Jarvis, MD   Blood Pressure Monitor KIT 1 kit by Does not apply route daily. Patient not taking: Reported on 12/25/2021 11/28/19   Iloabachie, Chioma E, NP  chlordiazePOXIDE (LIBRIUM) 25 MG capsule Take 1 capsule afternoon and night on 12/30/21, then 1 capsule morning and night on 12/31/21, then 1 capsule morning on 01/01/22, then done. 12/30/21   Darlin Priestly, MD  chlorthalidone (HYGROTON) 25 MG tablet Take 1 tablet (25 mg total) by mouth daily. 12/30/21 03/30/22  Darlin Priestly, MD  hydrALAZINE (APRESOLINE) 100 MG tablet Take 1 tablet (100 mg total) by mouth 3 (three) times daily. 10/26/22 01/24/23  Pilar Jarvis, MD  labetalol (NORMODYNE) 100 MG tablet Take 1 tablet (100 mg total) by mouth 2 (two) times daily. 10/26/22 01/24/23  Pilar Jarvis, MD  losartan (COZAAR) 100 MG tablet Take 1 tablet (100 mg total) by mouth daily. 10/26/22 01/24/23  Pilar Jarvis, MD  Multiple Vitamin (MULTIVITAMIN WITH MINERALS) TABS tablet Take 1 tablet by mouth daily. 12/31/21   Darlin Priestly, MD  nicotine (NICODERM CQ - DOSED IN MG/24 HOURS) 21 mg/24hr patch Place 1 patch (21 mg total) onto the skin daily. 12/31/21   Darlin Priestly, MD     Allergies  Patient has no known allergies.   Family History   Family History  Problem Relation Age of Onset  . Hypertension Mother   . Heart attack Father   . Hypertension Father      Physical Exam  Triage Vital Signs: ED Triage Vitals  Enc Vitals Group  BP 10/31/22 0322 (!) 197/115     Pulse Rate 10/31/22 0322 77     Resp 10/31/22 0322 20     Temp 10/31/22 0322 98.7 F (37.1 C)     Temp Source 10/31/22 0322 Oral     SpO2 10/31/22 0322 97 %     Weight 10/31/22 0323 239 lb 13.8 oz (108.8 kg)     Height 10/31/22 0323 5\' 8"  (1.727 m)     Head Circumference --      Peak Flow --      Pain Score 10/31/22 0323 10     Pain Loc --      Pain Edu? --      Excl. in GC? --     Updated Vital Signs: BP (!) 197/115 (BP Location: Left Arm)   Pulse 77   Temp 98.7 F (37.1 C) (Oral)   Resp 20   Ht 5\' 8"  (1.727 m)   Wt 108.8  kg   SpO2 97%   BMI 36.47 kg/m    General: Awake, no distress.  CV:  RRR.  Good peripheral perfusion.  Resp:  Normal effort.  CTAB. Abd:  Nontender.  No distention.  Other:  PERRL.  EOMI.  Frontal sinus tenderness to palpation.  No carotid bruits.  Supple neck without meningismus.  CN II-XII intact.  5/5 motor strength and sensation all extremities.  M AE x 4.   ED Results / Procedures / Treatments  Labs (all labs ordered are listed, but only abnormal results are displayed) Labs Reviewed - No data to display   EKG  None   RADIOLOGY I have independently visualized and interpreted patient's x-ray and CT head as well as noted the radiology interpretation:  Left knee x-ray: No acute osseous injury  CT head: No ICH  Official radiology report(s): CT Head Wo Contrast  Result Date: 10/31/2022 CLINICAL DATA:  57 year old male with persistent frontal headache, pressure behind the eyes. EXAM: CT HEAD WITHOUT CONTRAST TECHNIQUE: Contiguous axial images were obtained from the base of the skull through the vertex without intravenous contrast. RADIATION DOSE REDUCTION: This exam was performed according to the departmental dose-optimization program which includes automated exposure control, adjustment of the mA and/or kV according to patient size and/or use of iterative reconstruction technique. COMPARISON:  Head CT 05/20/2021. FINDINGS: Brain: Cerebral volume is within normal limits for age. No midline shift, ventriculomegaly, mass effect, evidence of mass lesion, intracranial hemorrhage or evidence of cortically based acute infarction. Mild for age nonspecific scattered chronic white matter hypodensity, and tiny chronic lacunar infarct versus perivascular space at the right caudate (unchanged, coronal image 31). Vascular: No suspicious intracranial vascular hyperdensity. Skull: No acute osseous abnormality identified. Sinuses/Orbits: Visualized paranasal sinuses and mastoids are clear. Other:  Stable scalp soft tissues since 2022. Orbits soft tissues appears stable and within normal limits. IMPRESSION: No acute intracranial abnormality. Stable since 2022, with up to mild for age chronic small vessel disease changes. Electronically Signed   By: Odessa Fleming M.D.   On: 10/31/2022 05:35   DG Knee Complete 4 Views Left  Result Date: 10/31/2022 CLINICAL DATA:  Recent fall with left knee pain, initial encounter EXAM: LEFT KNEE - COMPLETE 4+ VIEW COMPARISON:  None Available. FINDINGS: Tricompartmental degenerative changes are noted. No acute fracture or dislocation is seen. Moderate joint effusion is noted. IMPRESSION: Degenerative change without acute abnormality. Electronically Signed   By: Alcide Clever M.D.   On: 10/31/2022 03:49     PROCEDURES:  Critical Care  performed: No  Procedures   MEDICATIONS ORDERED IN ED: Medications  sodium chloride 0.9 % bolus 500 mL (500 mLs Intravenous New Bag/Given 10/31/22 0500)  metoCLOPramide (REGLAN) injection 5 mg (5 mg Intravenous Given 10/31/22 0501)  diphenhydrAMINE (BENADRYL) injection 25 mg (25 mg Intravenous Given 10/31/22 0503)  dexamethasone (DECADRON) injection 10 mg (10 mg Intravenous Given 10/31/22 0502)     IMPRESSION / MDM / ASSESSMENT AND PLAN / ED COURSE  I reviewed the triage vital signs and the nursing notes.                             57 year old male presenting with headache and left knee sprain. Differential diagnosis includes, but is not limited to, intracranial hemorrhage, meningitis/encephalitis, previous head trauma, cavernous venous thrombosis, tension headache, temporal arteritis, migraine or migraine equivalent, idiopathic intracranial hypertension, and non-specific headache.  I personally reviewed patient's records and note a history of alcohol dependency requiring hospitalization on 12/25/2021.  Patient's presentation is most consistent with acute complicated illness / injury requiring diagnostic workup.  The patient is on the  cardiac monitor to evaluate for evidence of arrhythmia and/or significant heart rate changes.  Neck is supple without meningismus; no focal neurological deficits on examination.  Patient requesting sandwich tray.  Will obtain CT head given history of alcohol dependency and elevated blood pressure.  Ace wrap left knee.  Administer IV Reglan, Decadron, Benadryl for headache and reassess.  Clinical Course as of 10/31/22 0611  Mon Oct 31, 2022  0600 Patient feeling significantly better, resting in no acute distress.  Updated, negative CT head.  Noted patient was recently prescribed antihypertensives 4 days ago.  Encouraged him to take them daily.  Will discharge after completion of IV fluids on as needed Fioricet.  Strict return precautions given.  Patient verbalizes understanding and agrees with plan of care. [JS]    Clinical Course User Index [JS] Irean HongSung, Mahalia Dykes J, MD     FINAL CLINICAL IMPRESSION(S) / ED DIAGNOSES   Final diagnoses:  Bad headache  Sprain of left knee, unspecified ligament, initial encounter  Hypertension, unspecified type     Rx / DC Orders   ED Discharge Orders          Ordered    butalbital-acetaminophen-caffeine (FIORICET) 50-325-40 MG tablet  Every 4 hours PRN        10/31/22 0609             Note:  This document was prepared using Dragon voice recognition software and may include unintentional dictation errors.

## 2022-10-31 NOTE — Discharge Instructions (Addendum)
Make sure to take your blood pressure medicines every day.  You may take Fioricet as needed for headache.  Wear Ace wrap as needed for comfort.  Return to the ER for worsening symptoms, persistent vomiting, difficulty breathing or other concerns.

## 2022-10-31 NOTE — ED Provider Notes (Signed)
[facility] Provider Note    Event Date/Time   First MD Initiated Contact with Patient 10/31/22 [phone]     (approximate)   History   Headache and Knee Injury   HPI  Justin Moore is a 57 y.o. male who presents to the ED from home with a chief complaint of headache and left knee injury.  Patient reports headache across the front of his head for the past several days with pressure behind his eyes x 1 day.  Endorses sinus congestion.  States he stepped into a ditch yesterday and twisted his left knee.  Denies fever/chills, vision changes, neck pain, chest pain, shortness of breath, abdominal pain, nausea, vomiting or dizziness.     Past Medical History   Past Medical History:  Diagnosis Date   Hypertension      Active Problem List   Patient Active Problem List   Diagnosis Date Noted   Alcohol withdrawal delirium, acute, hyperactive 12/26/2021   Thrombocytopenia 12/26/2021   Hypokalemia 12/26/2021   Hypertensive emergency 12/26/2021   Alcohol withdrawal 12/26/2021   Alcoholic hepatitis without ascites 05/20/2021   Alcohol abuse 05/20/2021   Smoking 12/05/2019   Elevated lipids 12/05/2019   Leukocytosis 12/05/2019   Encounter to establish care 11/28/2019   Essential hypertension 11/28/2019     Past Surgical History  History reviewed. No pertinent surgical history.   Home Medications   Prior to Admission medications   Medication Sig Start Date End Date Taking? Authorizing Provider  butalbital-acetaminophen-caffeine (FIORICET) 50-325-40 MG tablet Take 1 tablet by mouth every 4 (four) hours as needed for headache. 10/31/22  Yes Irean HongSung, Lisandro Meggett J, MD  albuterol (VENTOLIN HFA) 108 (90 Base) MCG/ACT inhaler Inhale 2 puffs into the lungs every 6 (six) hours as needed for wheezing or shortness of breath. 10/25/22   Justin PrairieSmith, Dylan, MD  amLODipine (NORVASC) 10 MG tablet Take 1 tablet (10 mg total) by mouth daily. 10/26/22 01/24/23  Pilar JarvisWong, Silas, MD  Blood Pressure  Monitor KIT 1 kit by Does not apply route daily. Patient not taking: Reported on 12/25/2021 11/28/19   Iloabachie, Chioma E, NP  chlordiazePOXIDE (LIBRIUM) 25 MG capsule Take 1 capsule afternoon and night on 12/30/21, then 1 capsule morning and night on 12/31/21, then 1 capsule morning on 01/01/22, then done. 12/30/21   Darlin PriestlyLai, Tina, MD  chlorthalidone (HYGROTON) 25 MG tablet Take 1 tablet (25 mg total) by mouth daily. 12/30/21 03/30/22  Darlin PriestlyLai, Tina, MD  hydrALAZINE (APRESOLINE) 100 MG tablet Take 1 tablet (100 mg total) by mouth 3 (three) times daily. 10/26/22 01/24/23  Pilar JarvisWong, Silas, MD  labetalol (NORMODYNE) 100 MG tablet Take 1 tablet (100 mg total) by mouth 2 (two) times daily. 10/26/22 01/24/23  Pilar JarvisWong, Silas, MD  losartan (COZAAR) 100 MG tablet Take 1 tablet (100 mg total) by mouth daily. 10/26/22 01/24/23  Pilar JarvisWong, Silas, MD  Multiple Vitamin (MULTIVITAMIN WITH MINERALS) TABS tablet Take 1 tablet by mouth daily. 12/31/21   Darlin PriestlyLai, Tina, MD  nicotine (NICODERM CQ - DOSED IN MG/24 HOURS) 21 mg/24hr patch Place 1 patch (21 mg total) onto the skin daily. 12/31/21   Darlin PriestlyLai, Tina, MD     Allergies  Patient has no known allergies.   Family History   Family History  Problem Relation Age of Onset   Hypertension Mother    Heart attack Father    Hypertension Father      Physical Exam  Triage Vital Signs: ED Triage Vitals  Enc Vitals Group  BP 10/31/22 0322 (!) 197/115     Pulse Rate 10/31/22 0322 77     Resp 10/31/22 0322 20     Temp 10/31/22 0322 98.7 F (37.1 C)     Temp Source 10/31/22 0322 Oral     SpO2 10/31/22 0322 97 %     Weight 10/31/22 0323 239 lb 13.8 oz (108.8 kg)     Height 10/31/22 0323 5\' 8"  (1.727 m)     Head Circumference --      Peak Flow --      Pain Score 10/31/22 0323 10     Pain Loc --      Pain Edu? --      Excl. in GC? --     Updated Vital Signs: BP (!) 189/118 (BP Location: Left Arm)   Pulse 72   Temp 98.5 F (36.9 C) (Oral)   Resp 18   Ht 5\' 8"  (1.727 m)   Wt 108.8 kg   SpO2 100%    BMI 36.47 kg/m    General: Awake, no distress.  CV:  RRR.  Good peripheral perfusion.  Resp:  Normal effort.  CTAB. Abd:  Nontender.  No distention.  Other:  PERRL.  EOMI.  Frontal sinus tenderness to palpation.  No carotid bruits.  Supple neck without meningismus.  CN II-XII intact.  5/5 motor strength and sensation all extremities.  M AE x 4.   ED Results / Procedures / Treatments  Labs (all labs ordered are listed, but only abnormal results are displayed) Labs Reviewed - No data to display   EKG  None   RADIOLOGY I have independently visualized and interpreted patient's x-ray and CT head as well as noted the radiology interpretation:  Left knee x-ray: No acute osseous injury  CT head:  Official radiology report(s): CT Head Wo Contrast  Result Date: 10/31/2022 CLINICAL DATA:  57 year old male with persistent frontal headache, pressure behind the eyes. EXAM: CT HEAD WITHOUT CONTRAST TECHNIQUE: Contiguous axial images were obtained from the base of the skull through the vertex without intravenous contrast. RADIATION DOSE REDUCTION: This exam was performed according to the departmental dose-optimization program which includes automated exposure control, adjustment of the mA and/or kV according to patient size and/or use of iterative reconstruction technique. COMPARISON:  Head CT 05/20/2021. FINDINGS: Brain: Cerebral volume is within normal limits for age. No midline shift, ventriculomegaly, mass effect, evidence of mass lesion, intracranial hemorrhage or evidence of cortically based acute infarction. Mild for age nonspecific scattered chronic white matter hypodensity, and tiny chronic lacunar infarct versus perivascular space at the right caudate (unchanged, coronal image 31). Vascular: No suspicious intracranial vascular hyperdensity. Skull: No acute osseous abnormality identified. Sinuses/Orbits: Visualized paranasal sinuses and mastoids are clear. Other: Stable scalp soft tissues  since 2022. Orbits soft tissues appears stable and within normal limits. IMPRESSION: No acute intracranial abnormality. Stable since 2022, with up to mild for age chronic small vessel disease changes. Electronically Signed   By: Odessa Fleming M.D.   On: 10/31/2022 05:35   DG Knee Complete 4 Views Left  Result Date: 10/31/2022 CLINICAL DATA:  Recent fall with left knee pain, initial encounter EXAM: LEFT KNEE - COMPLETE 4+ VIEW COMPARISON:  None Available. FINDINGS: Tricompartmental degenerative changes are noted. No acute fracture or dislocation is seen. Moderate joint effusion is noted. IMPRESSION: Degenerative change without acute abnormality. Electronically Signed   By: Alcide Clever M.D.   On: 10/31/2022 03:49     PROCEDURES:  Critical Care performed: No  Procedures   MEDICATIONS ORDERED IN ED: Medications  labetalol (NORMODYNE) injection 10 mg (has no administration in time range)  sodium chloride 0.9 % bolus 500 mL (0 mLs Intravenous Stopped 10/31/22 0625)  metoCLOPramide (REGLAN) injection 5 mg (5 mg Intravenous Given 10/31/22 0501)  diphenhydrAMINE (BENADRYL) injection 25 mg (25 mg Intravenous Given 10/31/22 0503)  dexamethasone (DECADRON) injection 10 mg (10 mg Intravenous Given 10/31/22 0502)     IMPRESSION / MDM / ASSESSMENT AND PLAN / ED COURSE  I reviewed the triage vital signs and the nursing notes.                             57 year old male presenting with headache and left knee sprain. Differential diagnosis includes, but is not limited to, intracranial hemorrhage, meningitis/encephalitis, previous head trauma, cavernous venous thrombosis, tension headache, temporal arteritis, migraine or migraine equivalent, idiopathic intracranial hypertension, and non-specific headache.  I personally reviewed patient's records and note a history of alcohol dependency requiring hospitalization on 12/25/2021.  Patient's presentation is most consistent with acute complicated illness / injury requiring  diagnostic workup.  The patient is on the cardiac monitor to evaluate for evidence of arrhythmia and/or significant heart rate changes.  Neck is supple without meningismus; no focal neurological deficits on examination.  Patient requesting sandwich tray.  Will obtain CT head given history of alcohol dependency and elevated blood pressure.  Ace wrap left knee.  Administer IV Reglan, Decadron, Benadryl for headache and reassess.  Clinical Course as of 10/31/22 0704  Mon Oct 31, 2022  0600 Patient feeling significantly better, resting in no acute distress.  Updated, negative CT head.  Noted patient was recently prescribed antihypertensives 4 days ago.  Encouraged him to take them daily.  Will discharge after completion of IV fluids on as needed Fioricet.  Strict return precautions given.  Patient verbalizes understanding and agrees with plan of care. [JS]  0703 Addendum: Patient pretty sleepy from Benadryl.  Will hold discharge home until patient is more awake.  Will administer IV dose of labetalol for blood pressure control. [JS]    Clinical Course User Index [JS] Irean Hong, MD     FINAL CLINICAL IMPRESSION(S) / ED DIAGNOSES   Final diagnoses:  Bad headache  Sprain of left knee, unspecified ligament, initial encounter  Hypertension, unspecified type     Rx / DC Orders   ED Discharge Orders          Ordered    butalbital-acetaminophen-caffeine (FIORICET) 50-325-40 MG tablet  Every 4 hours PRN        10/31/22 0609             Note:  This document was prepared using Dragon voice recognition software and may include unintentional dictation errors.   Irean Hong, MD 10/31/22 [phone]

## 2022-10-31 NOTE — ED Notes (Signed)
Ace wrap applied to left knee

## 2022-10-31 NOTE — ED Notes (Addendum)
Pt given orange juice as requested States ride will be here around 9a m

## 2022-10-31 NOTE — ED Triage Notes (Signed)
Pt c/o frontal HA with pressure behind eyes x1 day. Pt also reports he stepped into a ditch on 4/7 and has since had left knee pain. Pt denies weakness. Last medication was a BC powder last night at 2230.

## 2022-10-31 NOTE — ED Notes (Signed)
Pt calling for ride home 

## 2022-11-24 DIAGNOSIS — J9811 Atelectasis: Secondary | ICD-10-CM | POA: Diagnosis not present

## 2022-11-24 DIAGNOSIS — R160 Hepatomegaly, not elsewhere classified: Secondary | ICD-10-CM | POA: Diagnosis not present

## 2022-11-24 DIAGNOSIS — R0789 Other chest pain: Secondary | ICD-10-CM | POA: Diagnosis not present

## 2022-11-24 DIAGNOSIS — I10 Essential (primary) hypertension: Secondary | ICD-10-CM | POA: Diagnosis not present

## 2022-11-24 DIAGNOSIS — F17210 Nicotine dependence, cigarettes, uncomplicated: Secondary | ICD-10-CM | POA: Diagnosis not present

## 2022-11-24 DIAGNOSIS — K5730 Diverticulosis of large intestine without perforation or abscess without bleeding: Secondary | ICD-10-CM | POA: Diagnosis not present

## 2022-11-24 DIAGNOSIS — F1420 Cocaine dependence, uncomplicated: Secondary | ICD-10-CM | POA: Diagnosis not present

## 2022-11-24 DIAGNOSIS — R079 Chest pain, unspecified: Secondary | ICD-10-CM | POA: Diagnosis not present

## 2022-11-24 DIAGNOSIS — I2510 Atherosclerotic heart disease of native coronary artery without angina pectoris: Secondary | ICD-10-CM | POA: Diagnosis not present

## 2022-11-24 DIAGNOSIS — R0602 Shortness of breath: Secondary | ICD-10-CM | POA: Diagnosis not present

## 2022-12-05 DIAGNOSIS — S0240DA Maxillary fracture, left side, initial encounter for closed fracture: Secondary | ICD-10-CM | POA: Diagnosis not present

## 2022-12-05 DIAGNOSIS — Z7722 Contact with and (suspected) exposure to environmental tobacco smoke (acute) (chronic): Secondary | ICD-10-CM | POA: Diagnosis not present

## 2022-12-05 DIAGNOSIS — W182XXA Fall in (into) shower or empty bathtub, initial encounter: Secondary | ICD-10-CM | POA: Diagnosis not present

## 2022-12-05 DIAGNOSIS — Z91018 Allergy to other foods: Secondary | ICD-10-CM | POA: Diagnosis not present

## 2022-12-05 DIAGNOSIS — F32A Depression, unspecified: Secondary | ICD-10-CM | POA: Diagnosis not present

## 2022-12-05 DIAGNOSIS — R6884 Jaw pain: Secondary | ICD-10-CM | POA: Diagnosis not present

## 2022-12-12 DIAGNOSIS — Z7689 Persons encountering health services in other specified circumstances: Secondary | ICD-10-CM | POA: Diagnosis not present

## 2022-12-12 DIAGNOSIS — Z1330 Encounter for screening examination for mental health and behavioral disorders, unspecified: Secondary | ICD-10-CM | POA: Diagnosis not present

## 2022-12-12 DIAGNOSIS — R0602 Shortness of breath: Secondary | ICD-10-CM | POA: Diagnosis not present

## 2022-12-14 DIAGNOSIS — S0232XA Fracture of orbital floor, left side, initial encounter for closed fracture: Secondary | ICD-10-CM | POA: Diagnosis not present

## 2024-03-31 NOTE — ED Provider Notes (Signed)
 [facility]  ED Provider Note  Justin Moore 58 y.o. male DOB: 1966/07/09 MRN: [id number] History   Chief Complaint  Patient presents with  . Alcohol Intoxication    BIB EMS drinking for several days and has not eaten for days. Pt states: I have drank a lot, like a bottle.    HPI 58 year old black male brought in by EMS for alcohol intoxication.  Patient states his wife died around this date about 18 months ago.  He admits to drinking alcohol for the past 10 days and has had nothing to eat.  He denies any pain complaints denies any illicit substance use.  He is otherwise been at his medical baseline but admits to being noncompliant with his antihypertensive medications.  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  He denies any pain complaints has had no fever or chills no cough or URI symptoms he denies any chest pain or abdominal pain.  No shortness of breath.     Past Medical History:  Diagnosis Date  . HTN (hypertension)     No past surgical history on file.  Social History   Substance and Sexual Activity  Alcohol Use Yes   Comment: occasional   Tobacco Use History[1] E-Cigarettes  . Vaping Use    . Start Date    . Cartridges/Day    . Quit Date     Social History   Substance and Sexual Activity  Drug Use Not Currently  . Types: Cocaine         Allergies[2]  Home Medications   AMLODIPINE  BESYLATE (NORVASC ) 10 MG TABLET    Take one tablet (10 mg dose) by mouth daily.   HYDRALAZINE  HCL (APRESOLINE ) 100 MG TABLET    Take one tablet (100 mg dose) by mouth 3 (three) times a day.    Primary Survey  Primary Survey  Review of Systems   Review of Systems  Constitutional:  Negative for chills, fatigue and fever.  HENT:  Negative for congestion, ear pain, rhinorrhea, sneezing and sore throat.   Eyes:  Negative for visual disturbance.  Respiratory:  Negative for cough and shortness of  breath.   Cardiovascular:  Negative for chest pain, palpitations and leg swelling.  Gastrointestinal:  Negative for abdominal pain, anal bleeding, blood in stool, constipation, diarrhea, nausea and vomiting.  Endocrine: Negative for polyuria.  Genitourinary:  Negative for difficulty urinating, dysuria and hematuria.  Musculoskeletal:  Negative for arthralgias, back pain, myalgias, neck pain and neck stiffness.  Skin:  Negative for rash.  Neurological:  Negative for dizziness, syncope, weakness and headaches.  Hematological:  Does not bruise/bleed easily.  Psychiatric/Behavioral:  Negative for confusion.   All other systems reviewed and are negative.   Physical Exam   ED Triage Vitals [03/31/24 2132]  BP (!) 223/137  Heart Rate 97  Resp 18  SpO2 100 %  Temp 98.5 F (36.9 C)    Physical Exam  Nursing note and vitals reviewed. Constitutional: He appears well-developed and well-nourished. He does not appear distressed, does not appear ill and no respiratory distress. Not diaphoretic. Intoxicated black male pleasant and cooperative  HENT:  Head: Normocephalic and atraumatic.  Right Ear: Normal external ear.  Left Ear: Normal external ear.  Nose: Nose normal.  Mouth/Throat: Moist mucous membrane. Voice normal.  Eyes: EOM are intact. Conjunctivae are normal. Pupils are equal, round, and reactive to light. Right eye: no drainage. Left eye: no drainage. No scleral icterus.  Neck: Normal range of motion and voice normal. Neck supple. No JVD. No stridor. No nuchal rigidity.  Cardiovascular: Normal rate, regular rhythm, normal heart sounds and intact distal pulses.  No audible murmur. No friction rub and gallop.  Pulmonary/Chest: No respiratory distress. Respiratory effort normal and breath sounds normal.  Abdominal: Soft. There is no abdominal tenderness. Abdomen not distended. Bowel sounds are normal. No mass present.  Musculoskeletal: Normal range of motion. No obvious deformity noted  to extremities.     Cervical back: Normal range of motion and neck supple. no edema.  Neurological: He is alert and oriented to person, place, and time. Moves all extremities equally. He has normal speech. Cranial nerves intact II through XII. Sensation intact to light touch, bilateral upper and lower extremities. Strength 5/5 bilateral upper and lower extremities.  Gross motor sensory exam intact  Skin: Skin is warm. Not diaphoretic. Skin is dry. No pallor.  Psychiatric: His speech is normal. His behavior is normal. Judgment and thought content normal. He does not appear anxious. His affect is not angry, not blunt, not labile and not inappropriate. Cognition and memory are normal. He exhibits a depressed mood.     ED Course   Lab results: No data to display  Imaging: No data to display   ECG: ECG Results          ECG 12 lead (Final result)  Result time 03/31/24 22:00:39    Final result             Narrative:   Diagnosis Class Abnormal Acquisition Device D3K Ventricular Rate 108 Atrial Rate 108 P-R Interval 152 QRS Duration 92 Q-T Interval 368 QTC Calculation(Bazett) 493 Calculated P Axis 66 Calculated R Axis 53 Calculated T Axis 84  Diagnosis Sinus tachycardia Nonspecific ST and T wave abnormality Abnormal ECG When compared with ECG of 24-Nov-2022 22:39, T wave inversion no longer evident in Inferior leads agree Jonette Ned ([id number]) on 03/31/2024 10:00:26 PM certifies that he/she has reviewed the ECG tracing and confirms the independent  interpretation is correct.                                                                                          Pre-Sedation Procedures    Medical Decision Making 10:07 PM Patient appears intoxicated and smells heavily of EtOH.  Medical records reviewed including previous ED visits.  Patient's primary care visit indicates she does have a history of alcoholism and use of  cocaine.  He admits to EtOH and being noncompliant with his antihypertensive.  Blood pressure was elevated in the ED will use labetalol  for control.  No symptoms of hypertensive emergency.  EKG by my interpretation showed normal sinus rhythm without ischemic changes or ectopy.  Patient states he has not eaten anything in 10 days has only been drinking alcohol because he is depressed due to his wife's passing away about 18 months ago.  Rally pack IV has been ordered.  He will be observed in the ER to metabolize and then reevaluated.  Amount and/or Complexity of Data Reviewed Labs: ordered. ECG/medicine tests: ordered.  Risk Prescription drug management.  Provider Communication  New Prescriptions   No medications on file    Modified Medications   No medications on file    Discontinued Medications   No medications on file    Clinical Impression Final diagnoses:  Alcoholic intoxication without complication  Hypertensive urgency    ED Disposition     None                 Electronically signed by:       [1] Social History Tobacco Use  Smoking Status Some Days  . Current packs/day: 1.00  . Average packs/day: 1 pack/day for 20.0 years (20.0 ttl pk-yrs)  . Types: Cigarettes  Smokeless Tobacco Never  [2] No Known Allergies  Debby LITTIE Rocks, MD 04/01/24 0009

## 2024-04-21 ENCOUNTER — Ambulatory Visit (HOSPITAL_COMMUNITY)
Admission: EM | Admit: 2024-04-21 | Discharge: 2024-04-22 | Disposition: A | Discharge status: Other Acute Inpatient Hospital | Payer: MEDICAID | Attending: Physician Assistant | Admitting: Physician Assistant

## 2024-04-21 DIAGNOSIS — F330 Major depressive disorder, recurrent, mild: Secondary | ICD-10-CM | POA: Insufficient documentation

## 2024-04-21 DIAGNOSIS — F1090 Alcohol use, unspecified, uncomplicated: Secondary | ICD-10-CM | POA: Insufficient documentation

## 2024-04-21 DIAGNOSIS — F1010 Alcohol abuse, uncomplicated: Secondary | ICD-10-CM | POA: Insufficient documentation

## 2024-04-21 NOTE — ED Notes (Signed)
D: Pt resting, eyes closed, respirations even and unlabored. No distress noted. A: Continue Q 15 min checks for safety. R: Pt remains safe on the unit.   

## 2024-04-22 ENCOUNTER — Encounter (HOSPITAL_COMMUNITY): Payer: Self-pay

## 2024-04-22 ENCOUNTER — Other Ambulatory Visit (HOSPITAL_COMMUNITY)
Admission: EM | Admit: 2024-04-22 | Discharge: 2024-04-25 | Disposition: A | Discharge status: Home / Self Care | Payer: MEDICAID | Source: Intra-hospital

## 2024-04-22 ENCOUNTER — Other Ambulatory Visit: Payer: Self-pay

## 2024-04-22 DIAGNOSIS — F330 Major depressive disorder, recurrent, mild: Secondary | ICD-10-CM | POA: Insufficient documentation

## 2024-04-22 DIAGNOSIS — F1090 Alcohol use, unspecified, uncomplicated: Secondary | ICD-10-CM

## 2024-04-22 DIAGNOSIS — M25559 Pain in unspecified hip: Secondary | ICD-10-CM | POA: Insufficient documentation

## 2024-04-22 DIAGNOSIS — F1490 Cocaine use, unspecified, uncomplicated: Secondary | ICD-10-CM | POA: Insufficient documentation

## 2024-04-22 DIAGNOSIS — F1020 Alcohol dependence, uncomplicated: Secondary | ICD-10-CM | POA: Diagnosis not present

## 2024-04-22 DIAGNOSIS — G8929 Other chronic pain: Secondary | ICD-10-CM | POA: Insufficient documentation

## 2024-04-22 DIAGNOSIS — I10 Essential (primary) hypertension: Secondary | ICD-10-CM | POA: Diagnosis not present

## 2024-04-22 DIAGNOSIS — Z8673 Personal history of transient ischemic attack (TIA), and cerebral infarction without residual deficits: Secondary | ICD-10-CM | POA: Insufficient documentation

## 2024-04-22 DIAGNOSIS — Z634 Disappearance and death of family member: Secondary | ICD-10-CM | POA: Insufficient documentation

## 2024-04-22 DIAGNOSIS — Z79899 Other long term (current) drug therapy: Secondary | ICD-10-CM | POA: Insufficient documentation

## 2024-04-22 DIAGNOSIS — Z5900 Homelessness unspecified: Secondary | ICD-10-CM | POA: Insufficient documentation

## 2024-04-22 LAB — CBC WITH DIFFERENTIAL/PLATELET
Abs Immature Granulocytes: 0.05 K/uL (ref 0.00–0.07)
Basophils Absolute: 0.1 K/uL (ref 0.0–0.1)
Basophils Relative: 1 %
Eosinophils Absolute: 0.3 K/uL (ref 0.0–0.5)
Eosinophils Relative: 3 %
HCT: 37.2 % — ABNORMAL LOW (ref 39.0–52.0)
Hemoglobin: 13.7 g/dL (ref 13.0–17.0)
Immature Granulocytes: 1 %
Lymphocytes Relative: 33 %
Lymphs Abs: 3.5 K/uL (ref 0.7–4.0)
MCH: 32.9 pg (ref 26.0–34.0)
MCHC: 36.8 g/dL — ABNORMAL HIGH (ref 30.0–36.0)
MCV: 89.2 fL (ref 80.0–100.0)
Monocytes Absolute: 0.8 K/uL (ref 0.1–1.0)
Monocytes Relative: 8 %
Neutro Abs: 5.7 K/uL (ref 1.7–7.7)
Neutrophils Relative %: 54 %
Platelets: 372 K/uL (ref 150–400)
RBC: 4.17 MIL/uL — ABNORMAL LOW (ref 4.22–5.81)
RDW: 14.4 % (ref 11.5–15.5)
WBC: 10.4 K/uL (ref 4.0–10.5)
nRBC: 0 % (ref 0.0–0.2)

## 2024-04-22 LAB — HEMOGLOBIN A1C
Hgb A1c MFr Bld: 5.9 % — ABNORMAL HIGH (ref 4.8–5.6)
Mean Plasma Glucose: 122.63 mg/dL

## 2024-04-22 LAB — LIPID PANEL
Cholesterol: 193 mg/dL (ref 0–200)
HDL: 42 mg/dL (ref >40)
LDL Cholesterol: 102 mg/dL — ABNORMAL HIGH (ref 0–99)
Total CHOL/HDL Ratio: 4.6 ratio
Triglycerides: 247 mg/dL — ABNORMAL HIGH (ref <150)
VLDL: 49 mg/dL — ABNORMAL HIGH (ref 0–40)

## 2024-04-22 LAB — COMPREHENSIVE METABOLIC PANEL WITH GFR
ALT: 30 U/L (ref 0–44)
AST: 27 U/L (ref 15–41)
Albumin: 3.9 g/dL (ref 3.5–5.0)
Alkaline Phosphatase: 54 U/L (ref 38–126)
Anion gap: 10 (ref 5–15)
BUN: 15 mg/dL (ref 6–20)
CO2: 22 mmol/L (ref 22–32)
Calcium: 9.7 mg/dL (ref 8.9–10.3)
Chloride: 107 mmol/L (ref 98–111)
Creatinine, Ser: 1.36 mg/dL — ABNORMAL HIGH (ref 0.61–1.24)
GFR, Estimated: >60 mL/min (ref >60)
Glucose, Bld: 97 mg/dL (ref 70–99)
Potassium: 3.6 mmol/L (ref 3.5–5.1)
Sodium: 139 mmol/L (ref 135–145)
Total Bilirubin: 0.3 mg/dL (ref 0.0–1.2)
Total Protein: 7.1 g/dL (ref 6.5–8.1)

## 2024-04-22 LAB — TSH: TSH: 1.758 u[IU]/mL (ref 0.350–4.500)

## 2024-04-22 LAB — ETHANOL: Alcohol, Ethyl (B): <15 mg/dL (ref <15)

## 2024-04-22 MED ORDER — LOPERAMIDE HCL 2 MG PO CAPS: 2.0000 mg | ORAL_CAPSULE | ORAL | Status: DC | PRN

## 2024-04-22 MED ORDER — IBUPROFEN 600 MG PO TABS
600.0000 mg | ORAL_TABLET | Freq: Three times a day (TID) | ORAL | Status: DC | PRN
  Administered 2024-04-22: 600 mg via ORAL
  Filled 2024-04-22: qty 1

## 2024-04-22 MED ORDER — DIPHENHYDRAMINE HCL 50 MG/ML IJ SOLN: 50.0000 mg | Freq: Three times a day (TID) | INTRAMUSCULAR | Status: DC | PRN

## 2024-04-22 MED ORDER — HALOPERIDOL LACTATE 5 MG/ML IJ SOLN: 10.0000 mg | Freq: Three times a day (TID) | INTRAMUSCULAR | Status: DC | PRN

## 2024-04-22 MED ORDER — LOSARTAN POTASSIUM 50 MG PO TABS
50.0000 mg | ORAL_TABLET | Freq: Every day | ORAL | Status: DC
  Administered 2024-04-22: 50 mg via ORAL
  Filled 2024-04-22: qty 1

## 2024-04-22 MED ORDER — AMLODIPINE BESYLATE 5 MG PO TABS: 5.0000 mg | ORAL_TABLET | Freq: Every day | ORAL | Status: DC

## 2024-04-22 MED ORDER — HALOPERIDOL LACTATE 5 MG/ML IJ SOLN: 5.0000 mg | Freq: Three times a day (TID) | INTRAMUSCULAR | Status: DC | PRN

## 2024-04-22 MED ORDER — HYDRALAZINE HCL 50 MG PO TABS
100.0000 mg | ORAL_TABLET | Freq: Three times a day (TID) | ORAL | Status: DC
  Administered 2024-04-22 – 2024-04-25 (×9): 100 mg via ORAL
  Filled 2024-04-22 (×3): qty 2
  Filled 2024-04-22: qty 84
  Filled 2024-04-22 (×6): qty 2

## 2024-04-22 MED ORDER — ALBUTEROL SULFATE HFA 108 (90 BASE) MCG/ACT IN AERS
2.0000 | INHALATION_SPRAY | Freq: Four times a day (QID) | RESPIRATORY_TRACT | Status: DC | PRN
  Filled 2024-04-22: qty 6.7

## 2024-04-22 MED ORDER — LORAZEPAM 2 MG/ML IJ SOLN: 2.0000 mg | Freq: Three times a day (TID) | INTRAMUSCULAR | Status: DC | PRN

## 2024-04-22 MED ORDER — NICOTINE 14 MG/24HR TD PT24
14.0000 mg | MEDICATED_PATCH | Freq: Every day | TRANSDERMAL | Status: DC
  Administered 2024-04-22: 14 mg via TRANSDERMAL
  Filled 2024-04-22: qty 1

## 2024-04-22 MED ORDER — ACETAMINOPHEN 325 MG PO TABS
650.0000 mg | ORAL_TABLET | Freq: Four times a day (QID) | ORAL | Status: DC | PRN
  Administered 2024-04-22 – 2024-04-24 (×4): 650 mg via ORAL
  Filled 2024-04-22 (×5): qty 2

## 2024-04-22 MED ORDER — ADULT MULTIVITAMIN W/MINERALS CH
1.0000 | ORAL_TABLET | Freq: Every day | ORAL | Status: DC
  Administered 2024-04-22 – 2024-04-25 (×4): 1 via ORAL
  Filled 2024-04-22 (×4): qty 1

## 2024-04-22 MED ORDER — CHLORDIAZEPOXIDE HCL 25 MG PO CAPS
25.0000 mg | ORAL_CAPSULE | Freq: Three times a day (TID) | ORAL | Status: DC
  Administered 2024-04-24: 25 mg via ORAL
  Filled 2024-04-22: qty 1

## 2024-04-22 MED ORDER — FAMOTIDINE 20 MG PO TABS
20.0000 mg | ORAL_TABLET | Freq: Every day | ORAL | Status: DC
  Administered 2024-04-23 – 2024-04-25 (×3): 20 mg via ORAL
  Filled 2024-04-22 (×3): qty 1
  Filled 2024-04-22: qty 14

## 2024-04-22 MED ORDER — CHLORDIAZEPOXIDE HCL 25 MG PO CAPS: 25.0000 mg | ORAL_CAPSULE | Freq: Every day | ORAL | Status: DC

## 2024-04-22 MED ORDER — HYDROXYZINE HCL 25 MG PO TABS: 25.0000 mg | ORAL_TABLET | Freq: Three times a day (TID) | ORAL | Status: DC | PRN

## 2024-04-22 MED ORDER — ADULT MULTIVITAMIN W/MINERALS CH
1.0000 | ORAL_TABLET | Freq: Every day | ORAL | Status: DC
Start: 2024-04-22 — End: 2024-04-22

## 2024-04-22 MED ORDER — TRAZODONE HCL 50 MG PO TABS
50.0000 mg | ORAL_TABLET | Freq: Every evening | ORAL | Status: DC | PRN
  Administered 2024-04-22 – 2024-04-24 (×3): 50 mg via ORAL
  Filled 2024-04-22 (×3): qty 1

## 2024-04-22 MED ORDER — MAGNESIUM HYDROXIDE 400 MG/5ML PO SUSP: 30.0000 mL | Freq: Every day | ORAL | Status: DC | PRN

## 2024-04-22 MED ORDER — HYDROXYZINE HCL 25 MG PO TABS: 25.0000 mg | ORAL_TABLET | Freq: Four times a day (QID) | ORAL | Status: DC | PRN

## 2024-04-22 MED ORDER — CHLORDIAZEPOXIDE HCL 25 MG PO CAPS
25.0000 mg | ORAL_CAPSULE | Freq: Four times a day (QID) | ORAL | Status: AC
  Administered 2024-04-22 – 2024-04-23 (×6): 25 mg via ORAL
  Filled 2024-04-22 (×6): qty 1

## 2024-04-22 MED ORDER — ALBUTEROL SULFATE HFA 108 (90 BASE) MCG/ACT IN AERS: 2.0000 | INHALATION_SPRAY | Freq: Four times a day (QID) | RESPIRATORY_TRACT | Status: DC | PRN

## 2024-04-22 MED ORDER — ALUM & MAG HYDROXIDE-SIMETH 200-200-20 MG/5ML PO SUSP: 30.0000 mL | ORAL | Status: DC | PRN

## 2024-04-22 MED ORDER — FAMOTIDINE 20 MG PO TABS: 20.0000 mg | ORAL_TABLET | Freq: Every day | ORAL | Status: DC

## 2024-04-22 MED ORDER — HALOPERIDOL 5 MG PO TABS: 5.0000 mg | ORAL_TABLET | Freq: Three times a day (TID) | ORAL | Status: DC | PRN

## 2024-04-22 MED ORDER — TRAZODONE HCL 50 MG PO TABS: 50.0000 mg | ORAL_TABLET | Freq: Every evening | ORAL | Status: DC | PRN

## 2024-04-22 MED ORDER — THIAMINE HCL 100 MG/ML IJ SOLN
100.0000 mg | Freq: Once | INTRAMUSCULAR | Status: AC
  Administered 2024-04-22: 100 mg via INTRAMUSCULAR
  Filled 2024-04-22: qty 2

## 2024-04-22 MED ORDER — HYDROXYZINE HCL 25 MG PO TABS
25.0000 mg | ORAL_TABLET | Freq: Three times a day (TID) | ORAL | Status: DC | PRN
  Administered 2024-04-22: 25 mg via ORAL
  Filled 2024-04-22: qty 10
  Filled 2024-04-22: qty 1

## 2024-04-22 MED ORDER — AMLODIPINE BESYLATE 5 MG PO TABS
5.0000 mg | ORAL_TABLET | Freq: Every day | ORAL | Status: DC
  Administered 2024-04-22: 5 mg via ORAL
  Filled 2024-04-22: qty 1

## 2024-04-22 MED ORDER — LOSARTAN POTASSIUM 50 MG PO TABS
100.0000 mg | ORAL_TABLET | Freq: Every day | ORAL | Status: DC
  Administered 2024-04-23 – 2024-04-25 (×3): 100 mg via ORAL
  Filled 2024-04-22: qty 2
  Filled 2024-04-22: qty 28
  Filled 2024-04-22 (×2): qty 2

## 2024-04-22 MED ORDER — CHLORDIAZEPOXIDE HCL 25 MG PO CAPS: 25.0000 mg | ORAL_CAPSULE | ORAL | Status: DC

## 2024-04-22 MED ORDER — DIPHENHYDRAMINE HCL 50 MG PO CAPS: 50.0000 mg | ORAL_CAPSULE | Freq: Three times a day (TID) | ORAL | Status: DC | PRN

## 2024-04-22 MED ORDER — IBUPROFEN 600 MG PO TABS
600.0000 mg | ORAL_TABLET | Freq: Three times a day (TID) | ORAL | Status: DC | PRN
  Administered 2024-04-22 – 2024-04-24 (×4): 600 mg via ORAL
  Filled 2024-04-22 (×4): qty 1

## 2024-04-22 MED ORDER — NICOTINE 14 MG/24HR TD PT24
14.0000 mg | MEDICATED_PATCH | Freq: Every day | TRANSDERMAL | Status: DC
Start: 2024-04-23 — End: 2024-04-25
  Administered 2024-04-23 – 2024-04-24 (×2): 14 mg via TRANSDERMAL
  Filled 2024-04-22 (×3): qty 1
  Filled 2024-04-22: qty 14

## 2024-04-22 MED ORDER — FAMOTIDINE 20 MG PO TABS
20.0000 mg | ORAL_TABLET | Freq: Every day | ORAL | Status: DC
  Administered 2024-04-22: 20 mg via ORAL
  Filled 2024-04-22: qty 1

## 2024-04-22 MED ORDER — AMLODIPINE BESYLATE 10 MG PO TABS
10.0000 mg | ORAL_TABLET | Freq: Every day | ORAL | Status: DC
  Administered 2024-04-23 – 2024-04-25 (×3): 10 mg via ORAL
  Filled 2024-04-22: qty 14
  Filled 2024-04-22 (×3): qty 1

## 2024-04-22 MED ORDER — ACETAMINOPHEN 325 MG PO TABS: 650.0000 mg | ORAL_TABLET | Freq: Four times a day (QID) | ORAL | Status: DC | PRN

## 2024-04-22 MED ORDER — CHLORDIAZEPOXIDE HCL 25 MG PO CAPS: 25.0000 mg | ORAL_CAPSULE | Freq: Four times a day (QID) | ORAL | Status: DC | PRN

## 2024-04-22 MED ORDER — ONDANSETRON 4 MG PO TBDP: 4.0000 mg | ORAL_TABLET | Freq: Four times a day (QID) | ORAL | Status: DC | PRN

## 2024-04-22 NOTE — ED Notes (Signed)
 Pt A&Ox4, calm & cooperative and in NAD at this time. Denies SI/HI/AVH. Contracts for safety. Encouragement and support given. Will continue to monitor.

## 2024-04-22 NOTE — Discharge Instructions (Addendum)
 To [facility] for alcohol use disorder

## 2024-04-22 NOTE — ED Provider Notes (Signed)
 [facility] Continuous Assessment Admission H&P  Date: 04/22/24 Patient Name: Justin Moore MRN: [id number] Chief Complaint: Alcohol  Diagnoses:  Final diagnoses:  Alcohol use disorder    HPI:   Justin Moore is a 58 year old male with a past psychiatric history significant for alcohol abuse who presents to [facility] with a chief complaint of problems with alcohol.  Patient reports that he last drank alcohol today.  He reports that he drank a 22 oz of alcohol while watching the game.  Patient reports that he is trying to get clean from alcohol and his use of alcohol today let him know that he needs to remain sober.  Patient denies experiencing withdrawal symptoms at this time.  Patient denies overt depressive symptoms nor does he endorse anxiety.  Patient denies a past history of psychiatric illness.  Patient denies a past history of being placed on psychiatric medications.  Patient denies a past history of hospitalization due to mental health.  Patient denies a past history of suicide attempt.  He further denies a past history of self-harm.  Patient is alert and oriented x 4, calm, cooperative, and fully engaged in conversation during the encounter.  Patient's speech is clear, coherent, and with normal rate.  Patient maintains fair eye contact.  Patient's thought process is coherent.  Patient's thought content is within normal limits.  Patient exhibits euthymic mood with appropriate affect.  Patient denies suicidal or homicidal ideations.  He further denies auditory or visual hallucinations and does not appear to be responding to internal/external stimuli.  Patient endorses good sleep.  Patient endorses good appetite.  Patient states that he drinks 1/5 of alcohol a day.  Patient endorses tobacco use and smokes on average 10 cigarettes/day.  Patient denies illicit drug use at this time but states that he used to use crack.  Total Time spent  with patient: 20 minutes  Musculoskeletal  Strength & Muscle Tone: within normal limits Gait & Station: normal Patient leans: N/A  Psychiatric Specialty Exam  Presentation General Appearance:  Casual  Eye Contact: Fair  Speech: Clear and Coherent; Normal Rate  Speech Volume: Normal  Handedness: Right   Mood and Affect  Mood: Dysphoric  Affect: Appropriate   Thought Process  Thought Processes: Coherent; Goal Directed  Descriptions of Associations:Intact  Orientation:Full (Time, Place and Person)  Thought Content:WDL    Hallucinations:Hallucinations: None  Ideas of Reference:None  Suicidal Thoughts:Suicidal Thoughts: No  Homicidal Thoughts:Homicidal Thoughts: No   Sensorium  Memory: Immediate Fair; Recent Fair; Remote Fair  Judgment: Fair  Insight: Fair   Art therapist  Concentration: Good  Attention Span: Good  Recall: Good  Fund of Knowledge: Good  Language: Good   Psychomotor Activity  Psychomotor Activity: Psychomotor Activity: Normal   Assets  Assets: Communication Skills; Desire for Improvement; Housing; Social Support   Sleep  Sleep: Sleep: Good   Nutritional Assessment (For OBS and FBC admissions only) Has the patient had a weight loss or gain of 10 pounds or more in the last 3 months?: No Has the patient had a decrease in food intake/or appetite?: No Does the patient have dental problems?: No Does the patient have eating habits or behaviors that may be indicators of an eating disorder including binging or inducing vomiting?: No Has the patient recently lost weight without trying?: 0 Has the patient been eating poorly because of a decreased appetite?: 0 Malnutrition Screening Tool Score: 0    Physical Exam Constitutional:  Appearance: Normal appearance.  HENT:     Head: Normocephalic and atraumatic.     Nose: Nose normal.  Eyes:     Extraocular Movements: Extraocular movements intact.   Cardiovascular:     Rate and Rhythm: Normal rate and regular rhythm.  Pulmonary:     Effort: Pulmonary effort is normal.  Musculoskeletal:        General: Normal range of motion.     Cervical back: Normal range of motion.  Skin:    General: Skin is warm and dry.  Neurological:     General: No focal deficit present.     Mental Status: He is alert and oriented to person, place, and time.  Psychiatric:        Attention and Perception: Attention and perception normal. He does not perceive auditory or visual hallucinations.        Mood and Affect: Mood normal. Affect is blunt.        Speech: Speech normal.        Behavior: Behavior normal. Behavior is cooperative.        Thought Content: Thought content normal. Thought content is not paranoid or delusional. Thought content does not include homicidal or suicidal ideation. Thought content does not include suicidal plan.        Cognition and Memory: Cognition and memory normal.        Judgment: Judgment is inappropriate.    Review of Systems  Constitutional: Negative.   HENT: Negative.    Eyes: Negative.   Respiratory: Negative.    Cardiovascular: Negative.   Gastrointestinal: Negative.   Musculoskeletal: Negative.   Skin: Negative.   Neurological: Negative.   Psychiatric/Behavioral:  Positive for substance abuse. Negative for depression, hallucinations and suicidal ideas. The patient is not nervous/anxious and does not have insomnia.     Blood pressure (!) 165/88, pulse 86, temperature 97.6 F (36.4 C), temperature source Oral, resp. rate 16, SpO2 99%. There is no height or weight on file to calculate BMI.  Past Psychiatric History:  Alcohol abuse  Is the patient at risk to self? No  Has the patient been a risk to self in the past 6 months? No .    Has the patient been a risk to self within the distant past? No   Is the patient a risk to others? No   Has the patient been a risk to others in the past 6 months? No   Has the  patient been a risk to others within the distant past? No   Past Medical History:  Thrombocytopenia Essential hypertension Elevated lipids Leukocytosis Alcoholic hepatitis without ascites Hypokalemia  Family History:  Unknown  Social History:  Patient does not have stable housing at this time.  Patient is currently widowed.  Patient endorses having 5 children.  Patient is currently unemployed stating that he was supposed to start work next Monday.  Patient is [place].  Last Labs:  No visits with results within 6 Month(s) from this visit.  Latest known visit with results is:  Admission on 10/26/2022, Discharged on 10/26/2022  Component Date Value Ref Range Status   Sodium 10/26/2022 140  135 - 145 mmol/L Final   Potassium 10/26/2022 3.0 (L)  3.5 - 5.1 mmol/L Final   Chloride 10/26/2022 107  98 - 111 mmol/L Final   CO2 10/26/2022 23  22 - 32 mmol/L Final   Glucose, Bld 10/26/2022 119 (H)  70 - 99 mg/dL Final   Glucose reference range applies only to samples  taken after fasting for at least 8 hours.   BUN 10/26/2022 30 (H)  6 - 20 mg/dL Final   Creatinine, Ser 10/26/2022 1.79 (H)  0.61 - 1.24 mg/dL Final   Calcium  10/26/2022 9.3  8.9 - 10.3 mg/dL Final   GFR, Estimated 10/26/2022 44 (L)  >60 mL/min Final   Comment: (NOTE) Calculated using the CKD-EPI Creatinine Equation (2021)    Anion gap 10/26/2022 10  5 - 15 Final   Performed at [facility], [address] [place], [place] [address]   WBC 10/26/2022 16.2 (H)  4.0 - 10.5 K/uL Final   RBC 10/26/2022 4.56  4.22 - 5.81 MIL/uL Final   Hemoglobin 10/26/2022 14.1  13.0 - 17.0 g/dL Final   HCT 95/96/7975 39.0  39.0 - 52.0 % Final   MCV 10/26/2022 85.5  80.0 - 100.0 fL Final   MCH 10/26/2022 30.9  26.0 - 34.0 pg Final   MCHC 10/26/2022 36.2 (H)  30.0 - 36.0 g/dL Final   RDW 95/96/7975 14.0  11.5 - 15.5 % Final   Platelets 10/26/2022 336  150 - 400 K/uL Final   nRBC 10/26/2022 0.0  0.0 - 0.2 % Final   Performed at  [facility], [address] [place], [place] [address]   Troponin I (High Sensitivity) 10/26/2022 19 (H)  <18 ng/L Final   Comment: (NOTE) Elevated high sensitivity troponin I (hsTnI) values and significant  changes across serial measurements may suggest ACS but many other  chronic and acute conditions are known to elevate hsTnI results.  Refer to the Links section for chest pain algorithms and additional  guidance. Performed at [facility], [address] [place], [place] [address]    Tricyclic, Ur Screen 10/26/2022 NONE DETECTED  NONE DETECTED Final   Amphetamines, Ur Screen 10/26/2022 NONE DETECTED  NONE DETECTED Final   MDMA (Ecstasy)Ur Screen 10/26/2022 NONE DETECTED  NONE DETECTED Final   Cocaine Metabolite,Ur [place] 10/26/2022 POSITIVE (A)  NONE DETECTED Final   Opiate, Ur Screen 10/26/2022 NONE DETECTED  NONE DETECTED Final   Phencyclidine (PCP) Ur S 10/26/2022 NONE DETECTED  NONE DETECTED Final   Cannabinoid 50 Ng, Ur  10/26/2022 NONE DETECTED  NONE DETECTED Final   Barbiturates, Ur Screen 10/26/2022 NONE DETECTED  NONE DETECTED Final   Benzodiazepine, Ur Scrn 10/26/2022 NONE DETECTED  NONE DETECTED Final   Methadone Scn, Ur 10/26/2022 NONE DETECTED  NONE DETECTED Final   Comment: (NOTE) Tricyclics + metabolites, urine    Cutoff 1000 ng/mL Amphetamines + metabolites, urine  Cutoff 1000 ng/mL MDMA (Ecstasy), urine              Cutoff 500 ng/mL Cocaine Metabolite, urine          Cutoff 300 ng/mL Opiate + metabolites, urine        Cutoff 300 ng/mL Phencyclidine (PCP), urine         Cutoff 25 ng/mL Cannabinoid, urine                 Cutoff 50 ng/mL Barbiturates + metabolites, urine  Cutoff 200 ng/mL Benzodiazepine, urine              Cutoff 200 ng/mL Methadone, urine                   Cutoff 300 ng/mL  The urine drug screen provides only a preliminary, unconfirmed analytical test result and should not be used for non-medical purposes. Clinical  consideration and professional judgment should be  applied to any positive drug screen result due to possible interfering substances. A more specific alternate chemical method must be used in order to obtain a confirmed analytical result. Gas chromatography / mass spectrometry (GC/MS) is the preferred confirm                          atory method. Performed at [facility], [address] [place], [place] [address]    Troponin I (High Sensitivity) 10/26/2022 23 (H)  <18 ng/L Final   Comment: (NOTE) Elevated high sensitivity troponin I (hsTnI) values and significant  changes across serial measurements may suggest ACS but many other  chronic and acute conditions are known to elevate hsTnI results.  Refer to the Links section for chest pain algorithms and additional  guidance. Performed at [facility], [address] [place], [place] [address]    Color, Urine 10/26/2022 STRAW (A)  YELLOW Final   APPearance 10/26/2022 CLEAR (A)  CLEAR Final   Specific Gravity, Urine 10/26/2022 1.010  1.005 - 1.030 Final   pH 10/26/2022 6.0  5.0 - 8.0 Final   Glucose, UA 10/26/2022 NEGATIVE  NEGATIVE mg/dL Final   Hgb urine dipstick 10/26/2022 NEGATIVE  NEGATIVE Final   Bilirubin Urine 10/26/2022 NEGATIVE  NEGATIVE Final   Ketones, ur 10/26/2022 NEGATIVE  NEGATIVE mg/dL Final   Protein, ur 95/96/7975 NEGATIVE  NEGATIVE mg/dL Final   Nitrite 95/96/7975 NEGATIVE  NEGATIVE Final   Leukocytes,Ua 10/26/2022 NEGATIVE  NEGATIVE Final   Performed at [facility], [address] [place], [place] [address]   Magnesium  10/26/2022 2.4  1.7 - 2.4 mg/dL Final   Performed at [facility], [address] [place], [place] [address]   Total CK 10/26/2022 693 (H)  49 - 397 U/L Final   Performed at [facility], [address] [place], [place] [address]    Allergies: Patient has no known allergies.  Medications:  Facility Ordered Medications  Medication    acetaminophen  (TYLENOL ) tablet 650 mg   alum & mag hydroxide-simeth (MAALOX/MYLANTA) 200-200-20 MG/5ML suspension 30 mL   magnesium  hydroxide (MILK OF MAGNESIA) suspension 30 mL   haloperidol (HALDOL) tablet 5 mg   And   diphenhydrAMINE  (BENADRYL ) capsule 50 mg   haloperidol lactate (HALDOL) injection 5 mg   And   diphenhydrAMINE  (BENADRYL ) injection 50 mg   And   LORazepam  (ATIVAN ) injection 2 mg   haloperidol lactate (HALDOL) injection 10 mg   And   diphenhydrAMINE  (BENADRYL ) injection 50 mg   And   LORazepam  (ATIVAN ) injection 2 mg   hydrOXYzine  (ATARAX ) tablet 25 mg   traZODone (DESYREL) tablet 50 mg   PTA Medications  Medication Sig   Blood Pressure Monitor KIT 1 kit by Does not apply route daily. (Patient not taking: Reported on 12/25/2021)   nicotine  (NICODERM CQ  - DOSED IN MG/24 HOURS) 21 mg/24hr patch Place 1 patch (21 mg total) onto the skin daily.   Multiple Vitamin (MULTIVITAMIN WITH MINERALS) TABS tablet Take 1 tablet by mouth daily.   chlordiazePOXIDE  (LIBRIUM ) 25 MG capsule Take 1 capsule afternoon and night on 12/30/21, then 1 capsule morning and night on 12/31/21, then 1 capsule morning on 01/01/22, then done.   albuterol  (VENTOLIN  HFA) 108 (90 Base) MCG/ACT inhaler Inhale 2 puffs into the lungs every 6 (six) hours as needed for wheezing or shortness of breath.   butalbital -acetaminophen -caffeine  (FIORICET) 50-325-40 MG tablet Take 1 tablet by mouth every 4 (four) hours  as needed for headache.      Medical Decision Making  Patient presents to the encounter with the chief complaint of alcohol abuse.  Patient reports that he last drink today and would like to remain sober.  Patient denies experiencing withdrawal symptoms at this time.  Patient is seeking resources for residential treatment/rehab.  Based on my evaluation, patient meets criteria for overnight observation.  Prior to patient being admitted onto the floor, admission labs to be ordered and  initiated.  Recommendations  Based on my evaluation the patient does not appear to have an emergency medical condition.  Lecretia Buczek E Eugena Rhue, PA 04/22/24  12:13 AM

## 2024-04-22 NOTE — Care Management (Signed)
 OBS Care Management   Writer referred patient to [facility], [facility] and [facility].

## 2024-04-22 NOTE — ED Notes (Signed)
 Pt filled out consent for staff to be able to speak to his sister, Jerilyn. Consent currently in paper chart.

## 2024-04-22 NOTE — ED Notes (Signed)
 Pt sleeping at this time. Rise and fall of chest noted. Pt in NAD at this time. Will continue to monitor.

## 2024-04-22 NOTE — Group Note (Signed)
 Group Topic: Fears and Unhealthy Coping Skills  Group Date: 04/22/2024 Start Time: 1930 End Time: 2000 Facilitators: Verdon Jacqualyn BRAVO, NT  Department: [facility]  Number of Participants: 11  Group Focus: diagnosis education Treatment Modality:  Individual Therapy Interventions utilized were group exercise Purpose: increase insight  Name: Justin Moore Date of Birth: [birth year]  MR: [id number]    Level of Participation: none Quality of Participation: n/a Interactions with others: n/a Mood/Affect: n/a Triggers (if applicable): n/a Cognition: n/a Progress: Other Response: n/a Plan: patient will be encouraged to attend group Patients Problems:  Patient Active Problem List   Diagnosis Date Noted   Alcohol withdrawal delirium, acute, hyperactive (HCC) 12/26/2021   Thrombocytopenia 12/26/2021   Hypokalemia 12/26/2021   Hypertensive emergency 12/26/2021   Alcohol withdrawal (HCC) 12/26/2021   Alcoholic hepatitis without ascites 05/20/2021   Alcohol use disorder 05/20/2021   Smoking 12/05/2019   Elevated lipids 12/05/2019   Leukocytosis 12/05/2019   Encounter to establish care 11/28/2019   Essential hypertension 11/28/2019

## 2024-04-22 NOTE — ED Notes (Addendum)
 Pt oriented to unit, further questions denied. Pt presents as depressed, denies desire or plan to harm self. 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 Pt generally cooperative, polite. Pt observed to walk with care.

## 2024-04-22 NOTE — ED Notes (Signed)
 Paitent had dinner.

## 2024-04-22 NOTE — Progress Notes (Signed)
   04/21/24 2220  BHUC Triage Screening (Walk-ins at [facility] only)  How Did You Hear About Us ? Self  What Is the Reason for Your Visit/Call Today? Pt is a 58 yo male who presents to [facility] voluntarily seeking residential treatment. Pt reports that he struggles with alcohol abuse. Pt reports that he hasn't used in a week but thought he could habe one beer earlier today while watching the game. Pt reports that last year he went to [facility] for rehab. Pt denies mental health diagnosis and psychotropic medications. Pt denies SI, HI, and AVH. No outpatient services.  How Long Has This Been Causing You Problems? > than 6 months  Have You Recently Had Any Thoughts About Hurting Yourself? No  Are You Planning to Commit Suicide/Harm Yourself At This time? No  Have you Recently Had Thoughts About Hurting Someone Sherral? No  Are You Planning To Harm Someone At This Time? No  Physical Abuse Denies  Verbal Abuse Denies  Sexual Abuse Denies  Exploitation of patient/patient's resources Denies  Self-Neglect Denies  Possible abuse reported to:  (n/a)  Are you currently experiencing any auditory, visual or other hallucinations? No  Have You Used Any Alcohol or Drugs in the Past 24 Hours? Yes  What Did You Use and How Much? beer ( one can)  Do you have any current medical co-morbidities that require immediate attention? No  Clinician description of patient physical appearance/behavior: Pt was cooperative  What Do You Feel Would Help You the Most Today?  (n/a)  If access to [facility] was not available, would you have sought care in the Emergency Department? Yes  Determination of Need Routine (7 days)  Options For Referral Outpatient Therapy    Flowsheet Row ED from 04/21/2024 in [facility] ED from 10/31/2022 in [facility] Emergency Department at [facility] ED from 10/26/2022 in [facility] Emergency Department at [facility]  C-SSRS RISK CATEGORY No Risk No Risk No  Risk

## 2024-04-22 NOTE — ED Notes (Signed)
 Patient presents to [facility] as a voluntary walk-in seeking assistance with alcohol abuse. Patient reports drinking one 22 ounce beer today but usually drinks 1/5 of liquor daily. Patient is A&Ox4. Denies intent/thoughts to harm self/others when asked. Denies A/VH. Patient denies any physical complaints when asked. No distress noted. Support and encouragement provided. Routine safety checks conducted according to facility protocol. Encouraged patient to notify staff if thoughts of harm toward self or others arise. Endorses safety. Patient verbalized understanding and agreement. Plan of care ongoing, no further concerns as of present. Patient expresses no other needs at this time.

## 2024-04-22 NOTE — ED Notes (Signed)
 Pt c/o 8/10 level L hip pain, pt administered prn tylenol . Scheduled dose of hydralazine  administered for elevated BP. Pt currently watching TV in dayroom, observed to walk with care. Pt generally cooperative. RN reviewed medications with pt, questions denied.

## 2024-04-22 NOTE — ED Notes (Signed)
 Patient is in the bedroom calm and sleeping.NAD. Respirations even and unlabored. Will continue to monitor for safety.

## 2024-04-22 NOTE — BH Assessment (Signed)
 Comprehensive Clinical Assessment (CCA) Note   04/22/2024 Justin Moore [id number]   Disposition: Per Lytle Bolster, PA,  patient is recommended for  overnight observation with re-evaluation in the morning.     The patient demonstrates the following risk factors for suicide: Chronic risk factors for suicide include: substance use disorder. Acute risk factors for suicide include: unemployment and social withdrawal/isolation. Protective factors for this patient include: positive social support. Considering these factors, the overall suicide risk at this point appears to be low. Patient is not appropriate for outpatient follow up.   Patient is a 58 year old male with a history of depression who presents voluntarily to [facility] for an assessment. Patient resides in the home with his sister and mother and identifies them as their primary support system.Patient reports isolation, crying spells, irritability, hopelessness, guilt, loss of interest to do things they enjoy, fatigue, lack of concentration, worthlessness, change in sleep, change in appetite. Patient denies history of past suicide attempts. Patient has a hx of Substance Abuse: Alcohol Last use was 04/21/24 .Patient denies NSSIB, SI, HI, AVH.  Patient identifies his primary stressors as alcohol use and unemployment. Patient denies history of abuse or trauma. Patient denies current legal problems. Patient is not receiving outpatient therapy and psychiatry services. Patient reports he does not take any psychotropic medications. Patient reports previous rehab admission at [facility] during 2024. Patient denies access to weapons.   Patient is able to to contract for safety outside of the hospital.     Treatment options were discussed and patient is in agreement with recommendation for continuous observation. Pt reports that he is seeking residential treatment.    During evaluation pt is in no acute distress. He  is alert, oriented x 4, calm, cooperative and attentive. his mood is depressed, flat, and tearful with congruent affect. He has normal speech, and behavior.  Objectively there is no evidence of psychosis/mania or delusional thinking.  Patient is able to converse coherently, goal directed thoughts, no distractibility, or pre-occupation.   He also denies suicidal/self-harm/homicidal ideation, psychosis, and paranoia.  Patient answered question appropriately.      Chief Complaint: No chief complaint on file.  Visit Diagnosis: Alcohol use disorder    CCA Screening, Triage and Referral (STR)  Patient Reported Information How did you hear about us ? Self  What Is the Reason for Your Visit/Call Today? Pt is a 58 yo male who presents to [facility] voluntarily seeking residential treatment. Pt reports that he struggles with alcohol abuse. Pt reports that he hasn't used in a week but thought he could habe one beer earlier today while watching the game. Pt reports that last year he went to [facility] for rehab. Pt denies mental health diagnosis and psychotropic medications. Pt denies SI, HI, and AVH. No outpatient services.  How Long Has This Been Causing You Problems? > than 6 months  What Do You Feel Would Help You the Most Today? -- (n/a)   Have You Recently Had Any Thoughts About Hurting Yourself? No  Are You Planning to Commit Suicide/Harm Yourself At This time? No   Flowsheet Row ED from 04/21/2024 in [facility] ED from 10/31/2022 in [facility] Emergency Department at [facility] ED from 10/26/2022 in [facility] Emergency Department at [facility]  C-SSRS RISK CATEGORY No Risk No Risk No Risk    Have you Recently Had Thoughts About Hurting Someone Sherral? No  Are You Planning to Harm Someone  at This Time? No  Explanation: Denies HI   Have You Used Any Alcohol or Drugs in the Past 24 Hours? Yes  How Long Ago Did You Use Drugs or Alcohol? Earlier  tonight What Did You Use and How Much? beer ( one can)   Do You Currently Have a Therapist/Psychiatrist? No  Name of Therapist/Psychiatrist:    Have You Been Recently Discharged From Any Office Practice or Programs? No  Explanation of Discharge From Practice/Program: n/a    CCA Screening Triage Referral Assessment Type of Contact: Face-to-Face  Telemedicine Service Delivery:   Is this Initial or Reassessment?   Date Telepsych consult ordered in [facility]:    Time Telepsych consult ordered in [facility]:    Location of Assessment: [facility] Assessment Services  Provider Location: [facility] Assessment Services   Collateral Involvement: n/a   Does Patient Have a Automotive engineer Guardian? No  Legal Guardian Contact Information: n/a  Copy of Legal Guardianship Form: -- (n/a)  Legal Guardian Notified of Arrival: -- (n/a)  Legal Guardian Notified of Pending Discharge: -- (n/a)  If Minor and Not Living with Parent(s), Who has Custody? n/a  Is CPS involved or ever been involved? Never  Is APS involved or ever been involved? Never   Patient Determined To Be At Risk for Harm To Self or Others Based on Review of Patient Reported Information or Presenting Complaint? No  Method: No Plan  Availability of Means: No access or NA  Intent: Vague intent or NA  Notification Required: No need or identified person  Additional Information for Danger to Others Potential: -- (n/a)  Additional Comments for Danger to Others Potential: n/a  Are There Guns or Other Weapons in Your Home? No  Types of Guns/Weapons: Denies access  Are These Weapons Safely Secured?                            Yes  Who Could Verify You Are Able To Have These Secured: Denies access  Do You Have any Outstanding Charges, Pending Court Dates, Parole/Probation? Denies pending legal charges  Contacted To Inform of Risk of Harm To Self or Others: Other: Comment (n/a)    Does Patient Present under Involuntary  Commitment? No    [place] of Residence: [place]   Patient Currently Receiving the Following Services: Not Receiving Services   Determination of Need: Routine (7 days)   Options For Referral: Outpatient Therapy     CCA Biopsychosocial Patient Reported Schizophrenia/Schizoaffective Diagnosis in Past: No   Strengths: Pt is willing to seek treatment   Mental Health Symptoms Depression:  Change in energy/activity; Difficulty Concentrating; Hopelessness; Worthlessness; Tearfulness; Sleep (too much or little)   Duration of Depressive symptoms: Duration of Depressive Symptoms: Greater than two weeks   Mania:  None   Anxiety:   none   Psychosis:  None   Duration of Psychotic symptoms:    Trauma:  None   Obsessions:  None   Compulsions:  None   Inattention:  None   Hyperactivity/Impulsivity:  None   Oppositional/Defiant Behaviors:  None   Emotional Irregularity:  None   Other Mood/Personality Symptoms:  n/a    Mental Status Exam Appearance and self-care  Stature:  Average   Weight:  Average weight   Clothing:  Casual   Grooming:  Normal   Cosmetic use:  None   Posture/gait:  Normal   Motor activity:  Not Remarkable   Sensorium  Attention:  Normal   Concentration:  Normal   Orientation:  X5   Recall/memory:  Normal   Affect and Mood  Affect:  Flat; Depressed   Mood:  Depressed   Relating  Eye contact:  Normal   Facial expression:  Depressed; Sad   Attitude toward examiner:  Cooperative   Thought and Language  Speech flow: Clear and Coherent   Thought content:  Appropriate to Mood and Circumstances   Preoccupation:  None   Hallucinations:  None   Organization:  Coherent   [employer] of Knowledge:  Fair   Intelligence:  Average   Abstraction:  Normal   Judgement:  Good   Reality Testing:  Adequate   Insight:  Good   Decision Making:  Impulsive   Social Functioning  Social Maturity:  normal   Social  Judgement:  Chief of Staff   Stress  Stressors:  Housing; Transitions   Coping Ability:  Exhausted; Overwhelmed   Skill Deficits:  Activities of daily living; Decision making   Supports:  Family     Religion: Religion/Spirituality Are You A Religious Person?: No How Might This Affect Treatment?: n/a  Leisure/Recreation: Leisure / Recreation Do You Have Hobbies?: No  Exercise/Diet: Exercise/Diet Do You Exercise?: No Have You Gained or Lost A Significant Amount of Weight in the Past Six Months?: No Do You Follow a Special Diet?: No Do You Have Any Trouble Sleeping?: Yes Explanation of Sleeping Difficulties: Pt reports he is not sleeping. Pt reports getting 2-4 hours per night   CCA Employment/Education Employment/Work Situation: Employment / Work Situation Employment Situation: Unemployed Patient's Job has Been Impacted by Current Illness: No Has Patient ever Been in Equities trader?: No  Education: Education Is Patient Currently Attending School?: No Last Grade Completed: 12 Did You Product manager?: No Did You Have An Individualized Education Program (IIEP): No Did You Have Any Difficulty At [employer]?: No Patient's Education Has Been Impacted by Current Illness: No   CCA Family/Childhood History Family and Relationship History: Family history Marital status: Single Does patient have children?: No  Childhood History:  Childhood History By whom was/is the patient raised?: Mother Did patient suffer any verbal/emotional/physical/sexual abuse as a child?: No Did patient suffer from severe childhood neglect?: No Has patient ever been sexually abused/assaulted/raped as an adolescent or adult?: No Was the patient ever a victim of a crime or a disaster?: No Witnessed domestic violence?: No Has patient been affected by domestic violence as an adult?: No       CCA Substance Use Alcohol/Drug Use: Alcohol / Drug Use Pain Medications: See MAR Prescriptions: See  MAR Over the Counter: See MAR History of alcohol / drug use?: Yes Longest period of sobriety (when/how long): Unknown Negative Consequences of Use: Work / School Withdrawal Symptoms: None Substance #1 Name of Substance 1: EOTH 1 - Amount (size/oz): 1/5 of liquor 1 - Frequency: Daily 1 - Last Use / Amount: Today 04/21/24                       ASAM's:  Six Dimensions of Multidimensional Assessment  Dimension 1:  Acute Intoxication and/or Withdrawal Potential:      Dimension 2:  Biomedical Conditions and Complications:      Dimension 3:  Emotional, Behavioral, or Cognitive Conditions and Complications:     Dimension 4:  Readiness to Change:     Dimension 5:  Relapse, Continued use, or Continued Problem Potential:     Dimension 6:  Recovery/Living  Environment:     ASAM Severity Score:    ASAM Recommended Level of Treatment: ASAM Recommended Level of Treatment: Level III Residential Treatment   Substance use Disorder (SUD) Substance Use Disorder (SUD)  Checklist Symptoms of Substance Use: Continued use despite having a persistent/recurrent physical/psychological problem caused/exacerbated by use, Continued use despite persistent or recurrent social, interpersonal problems, caused or exacerbated by use  Recommendations for Services/Supports/Treatments: Recommendations for Services/Supports/Treatments Recommendations For Services/Supports/Treatments: Facility Based Crisis, Other (Comment) (continuous observation)  Disposition Recommendation per psychiatric provider: Continuous Observation    DSM5 Diagnoses: Patient Active Problem List   Diagnosis Date Noted   Alcohol withdrawal delirium, acute, hyperactive (HCC) 12/26/2021   Thrombocytopenia 12/26/2021   Hypokalemia 12/26/2021   Hypertensive emergency 12/26/2021   Alcohol withdrawal (HCC) 12/26/2021   Alcoholic hepatitis without ascites 05/20/2021   Alcohol abuse 05/20/2021   Smoking 12/05/2019   Elevated lipids  12/05/2019   Leukocytosis 12/05/2019   Encounter to establish care 11/28/2019   Essential hypertension 11/28/2019     Referrals to Alternative Service(s): Referred to Alternative Service(s):   Place:   Date:   Time:    Referred to Alternative Service(s):   Place:   Date:   Time:    Referred to Alternative Service(s):   Place:   Date:   Time:    Referred to Alternative Service(s):   Place:   Date:   Time:     Rosina PARAS, [place], [facility]

## 2024-04-22 NOTE — ED Notes (Signed)
 Patient resting with eyes closed. Respirations even and unlabored. No distress noted. Environment secured. Plan of care ongoing, no further concerns as of present.

## 2024-04-22 NOTE — Group Note (Signed)
 Group Topic: Fears and Unhealthy Coping Skills  Group Date: 04/22/2024 Start Time: 0900 End Time: 0950 Facilitators: Lonzell Dwayne RAMAN, NT  Department: [facility]  Number of Participants: 7  Group Focus: chemical dependency issues Treatment Modality:  Patient-Centered Therapy Interventions utilized were group exercise Purpose: reinforce self-care  Name: Justin Moore Date of Birth: 1966/07/20  MR: [id number]    Level of Participation: Patient did not attend group Quality of Participation: N/A Interactions with others: N/A Mood/Affect: N/A Triggers (if applicable): N/A Cognition: N/A Progress: N/A Response: N/A Plan: N/A  Patients Problems:  Patient Active Problem List   Diagnosis Date Noted   Alcohol withdrawal delirium, acute, hyperactive (HCC) 12/26/2021   Thrombocytopenia 12/26/2021   Hypokalemia 12/26/2021   Hypertensive emergency 12/26/2021   Alcohol withdrawal (HCC) 12/26/2021   Alcoholic hepatitis without ascites 05/20/2021   Alcohol use disorder 05/20/2021   Smoking 12/05/2019   Elevated lipids 12/05/2019   Leukocytosis 12/05/2019   Encounter to establish care 11/28/2019   Essential hypertension 11/28/2019

## 2024-04-22 NOTE — ED Notes (Signed)
 Pt ate dinner. RN discussed new medication orders for librium . Pt also reported 5/10 chronic L hip pain , prn ibu administered.

## 2024-04-22 NOTE — ED Provider Notes (Signed)
 OBS ASAP Discharge Summary  Date and Time: 04/22/2024 1:47 PM  Name: Justin Moore  MRN:  [id number]   Discharge Diagnoses:  Final diagnoses:  Alcohol use disorder  Mild episode of recurrent major depressive disorder    Subjective: Justin Moore is a 58 year old male with a past psychiatric history significant for alcohol abuse who presents to [facility] with a chief complaint of problems with alcohol use that has been negatively impacting his life.  Patient had a large beer prior to admission yesterday. He relapsed despite knowing that he had trouble with alcohol.  Patient on re-exam this morning was pleasant, agreeable. Oriented x3. Discussed his history of alcohol use disorder and what he hoped to gain from treatment. Discussed his history of cocaine use (last use 08/2023). Patient would like to be in a long-term program for alcohol use disorder. Discussed risk and benefits of adding naltrexone to help him with his alcohol cravings. Patient was agreeable.  Stay Summary: Admitted 9/28 for OBS, DC to [facility] for alcohol use disorder   Total Time spent with patient: 45 minutes  Past Psychiatric History: No psychiatric history other than alcohol use disorder, nicotine  use disorder Past Medical History: HTN, COPD, leukocytosis, hyperlipidemia Family History: CAD - Father; HTN - mother and father.  Family Psychiatric History: denies hx Social History: Patient lives alone currently. He is widowed (his wife passed away 03-18-22). Patient continues to struggle with grief.  Tobacco Cessation:  A prescription for an FDA-approved tobacco cessation medication provided at discharge  Current Medications:  No current facility-administered medications for this encounter.   No current outpatient medications on file.        05/25/2020    9:59 AM 08/25/2017   10:02 AM  Depression screen PHQ 2/9  Decreased Interest 0 0  Down, Depressed, Hopeless 0 0  PHQ -  2 Score 0 0    Flowsheet Row ED from 04/22/2024 in [facility] ED from 04/21/2024 in [facility] ED from 10/31/2022 in [facility] Emergency Department at [facility]  C-SSRS RISK CATEGORY No Risk No Risk No Risk    Musculoskeletal  Strength & Muscle Tone: within normal limits Gait & Station: unsteady Patient leans: Right  Psychiatric Specialty Exam  Presentation  General Appearance:  Casual  Eye Contact: Good  Speech: Clear and Coherent  Speech Volume: Normal  Handedness: Right   Mood and Affect  Mood: Depressed  Affect: Appropriate; Congruent   Thought Process  Thought Processes: Coherent; Linear  Descriptions of Associations:Intact  Orientation:Full (Time, Place and Person)  Thought Content:WDL  Diagnosis of Schizophrenia or Schizoaffective disorder in past: No    Hallucinations:Hallucinations: None  Ideas of Reference:None  Suicidal Thoughts:Suicidal Thoughts: No  Homicidal Thoughts:Homicidal Thoughts: No   Sensorium  Memory: Immediate Good; Recent Good; Remote Good  Judgment: Good  Insight: Fair   Art therapist  Concentration: Good  Attention Span: Good  Recall: Good  Fund of Knowledge: Good  Language: Good   Psychomotor Activity  Psychomotor Activity: Psychomotor Activity: Normal   Assets  Assets: Communication Skills; Desire for Improvement; Vocational/Educational; Social Support   Sleep  Sleep: Sleep: Good  No Safety Checks orders active in given range  Nutritional Assessment (For OBS and FBC admissions only) Has the patient had a weight loss or gain of 10 pounds or more in the last 3 months?: No Has the patient had a decrease in food intake/or appetite?: No Does the patient  have dental problems?: No Does the patient have eating habits or behaviors that may be indicators of an eating disorder including binging or inducing vomiting?: No Has  the patient recently lost weight without trying?: 0 Has the patient been eating poorly because of a decreased appetite?: 0 Malnutrition Screening Tool Score: 0    Physical Exam  Physical Exam Vitals and nursing note reviewed.  Constitutional:      Appearance: He is obese.  HENT:     Head: Normocephalic.     Nose: Nose normal.  Pulmonary:     Effort: Pulmonary effort is normal.  Neurological:     Mental Status: He is alert and oriented to person, place, and time.  Psychiatric:        Attention and Perception: Attention normal.        Mood and Affect: Affect normal. Mood is depressed.        Speech: Speech normal.        Behavior: Behavior normal. Behavior is cooperative.        Thought Content: Thought content normal.        Cognition and Memory: Cognition and memory normal.        Judgment: Judgment normal.    Review of Systems  Gastrointestinal:  Positive for heartburn.  Musculoskeletal:  Positive for joint pain.  Skin: Negative.   Psychiatric/Behavioral:  Positive for depression and substance abuse. Negative for hallucinations, memory loss and suicidal ideas. The patient is not nervous/anxious and does not have insomnia.    Blood pressure (!) 156/97, pulse 79, temperature 98.4 F (36.9 C), temperature source Oral, resp. rate 18, SpO2 100%. There is no height or weight on file to calculate BMI.  Suicide Risk Assessment:  Suicidal ideation/thoughts:  []  Current  []  Recent  []  Denies   Intention to act or plan:       []  Current  []  Recent []  Denies   Preparatory behavior:    []  Recent  []  Denies   Suicide attempts:             []  Remote    []  Recent  []  Denies   []  Multiple     Risk Factors  Protective Factors  Acute  Escalating substance use, Recent impulsivity or acting recklessly, Worsening medical condition, and Severe pain AcuteSuicideProtectiveFactors: No access to highly lethal means, Denies current SI or Intent, No recent suicide attempts, No recent self-harm  behavior, Not in acute distress, No legal problems, No recent psychiatric hospitalizations, and Executive function intact  Chronic Pattern of impulsivity/recklessness, Single, Separated, or Divorced, Early widowhood, Lack of social support, Lives alone, Chronic pain, and Male sex No previous suicide attempt, No previous self-harm behaviors, No major psychiatric disorder, No history of cluster B personality disorder/traits, Emotional stability, Employed, Stable housing, Age (33-59), No barriers to healthcare access, and Achievable life goals/ambitions   Potential future factors: FutureSuicideFactors : Chronic/terminal illness of self  Summary: While it is impossible to accurately predict with absolute certainty future events and human behaviors, an assessment of current suicidal indicators, risk factors, and protective factors suggests that this patient's:   Acute suicide risk is: moderate in degree .   Chronic suicide risk pd:fpoi in degree. Increases with substance/alcohol use and acute intoxication.   Plan Of Care/Follow-up recommendations:   - Start naltrexone 50 mg daily for alcohol use disorder - Start home medications for BP.  Disposition: Admit to [facility]  Lynwood Morene Lavone Delsie, MD 04/22/2024, 1:47 PM

## 2024-04-23 DIAGNOSIS — F1020 Alcohol dependence, uncomplicated: Secondary | ICD-10-CM | POA: Diagnosis not present

## 2024-04-23 DIAGNOSIS — F330 Major depressive disorder, recurrent, mild: Secondary | ICD-10-CM | POA: Diagnosis not present

## 2024-04-23 DIAGNOSIS — F1490 Cocaine use, unspecified, uncomplicated: Secondary | ICD-10-CM | POA: Diagnosis not present

## 2024-04-23 DIAGNOSIS — I10 Essential (primary) hypertension: Secondary | ICD-10-CM | POA: Diagnosis not present

## 2024-04-23 LAB — POCT URINE DRUG SCREEN - MANUAL ENTRY (I-SCREEN)
POC Amphetamine UR: NOT DETECTED
POC Buprenorphine (BUP): NOT DETECTED
POC Cocaine UR: NOT DETECTED
POC Marijuana UR: NOT DETECTED
POC Methadone UR: NOT DETECTED
POC Methamphetamine UR: NOT DETECTED
POC Morphine: NOT DETECTED
POC Oxazepam (BZO): POSITIVE — AB
POC Oxycodone UR: NOT DETECTED
POC Secobarbital (BAR): NOT DETECTED

## 2024-04-23 NOTE — ED Notes (Signed)
 Pt sitting in dayroom watching television. No acute distress noted. No concerns voiced. Informed pt to notify staff with any needs or assistance. Pt verbalized understanding and agreement. Will continue to monitor for safety.

## 2024-04-23 NOTE — Group Note (Signed)
 Group Topic: Social Support  Group Date: 04/23/2024 Start Time: 1200 End Time: 1230 Facilitators: Derril Franek, Zane HERO, RN; Herold Lajuana NOVAK, RN  Department: [facility]  Number of Participants: 5  Group Focus: check in and social skills Treatment Modality:  Individual Therapy Interventions utilized were support Purpose: express feelings and increase insight  Name: Justin Moore Date of Birth: May 17, 1966  MR: [id number]    Level of Participation: active Quality of Participation: attentive and cooperative Interactions with others: gave feedback Mood/Affect: appropriate Triggers (if applicable): None identified Cognition: coherent/clear and logical Progress: Gaining insight Response: Patient shared thoughts and concerns regarding current stay. Social support provided. No distress noted. Plan: patient will be encouraged to continue to attend groups/programming on the unit  Patients Problems:  Patient Active Problem List   Diagnosis Date Noted   Alcohol withdrawal delirium, acute, hyperactive (HCC) 12/26/2021   Thrombocytopenia 12/26/2021   Hypokalemia 12/26/2021   Hypertensive emergency 12/26/2021   Alcohol withdrawal (HCC) 12/26/2021   Alcoholic hepatitis without ascites 05/20/2021   Alcohol use disorder 05/20/2021   Smoking 12/05/2019   Elevated lipids 12/05/2019   Leukocytosis 12/05/2019   Encounter to establish care 11/28/2019   Essential hypertension 11/28/2019

## 2024-04-23 NOTE — ED Notes (Signed)
 Pt is sleeping at this moment. No acute distress noted. Respiration are even and labored. Q15 safety checks are in place.

## 2024-04-23 NOTE — ED Notes (Signed)
 Paitent had lunch.

## 2024-04-23 NOTE — Group Note (Signed)
 Group Topic: Healthy Self Image and Positive Change  Group Date: 04/23/2024 Start Time: 2000 End Time: 2030 Facilitators: Anice Benton LABOR, NT  Department: [facility]  Number of Participants: 5  Group Focus: goals/reality orientation Treatment Modality:  Individual Therapy Interventions utilized were assignment Purpose: express feelings and healthy future goals   Name: IRINEO GAULIN Date of Birth: [birth year]  MR: [id number]    Level of Participation: active Quality of Participation: attentive and cooperative Interactions with others: gave feedback Mood/Affect: appropriate and positive Triggers (if applicable): N/A Cognition: coherent/clear Progress: Moderate Response: Good Plan: follow-up needed  Patients Problems:  Patient Active Problem List   Diagnosis Date Noted   Alcohol withdrawal delirium, acute, hyperactive (HCC) 12/26/2021   Thrombocytopenia 12/26/2021   Hypokalemia 12/26/2021   Hypertensive emergency 12/26/2021   Alcohol withdrawal (HCC) 12/26/2021   Alcoholic hepatitis without ascites 05/20/2021   Alcohol use disorder 05/20/2021   Smoking 12/05/2019   Elevated lipids 12/05/2019   Leukocytosis 12/05/2019   Encounter to establish care 11/28/2019   Essential hypertension 11/28/2019

## 2024-04-23 NOTE — ED Provider Notes (Signed)
 Facility Based Crisis Admission H&P  Date: 04/23/24 Patient Name: TARREN Moore MRN: [id number] Chief Complaint: Alcohol  Diagnoses:  Final diagnoses:  Alcohol use disorder  Mild episode of recurrent major depressive disorder  Essential hypertension    HPI: Justin Moore 58 y.o., male patient with CC: Alcohol. He reports consuming approximately one-fifth of vodka daily, with his last drink occurring on Sunday. Prior to this, he reports maintaining sobriety for about 30 days. On Sunday, he consumed approximately 20 oz of beer. He reports began drinking at age 36. He states that the onset of his alcohol use was triggered by the loss of his wife in 2023. He found her unresponsive on the floor of their home, later learning she had suffered a stroke due to hypertension. This traumatic event precipitated a relapse in his alcohol use. He reports that they had been together since 7th grade and were married for approximately 26 years. His longest period of sobriety was from 2022 to 2023, ending after his wife's death.  Approximately 30 days ago, he was hospitalized in [place] for elevated blood pressure due to non-adherence to his antihypertensive medications. He reports that his alcohol use is now affecting his ability to work, that he has lost his home and his car. He is employed in [employer], [employer] and is scheduled to return to work on Monday.  In addition to alcohol use, the patient endorses crack cocaine use via smoking, with a reported daily spending of approximately $200. His last use was in March, 2025. He began using crack cocaine at age 79. He expresses a desire to learn the appropriate tools for managing his recovery and how to apply them effectively. He reports interest in participating in a long-term rehabilitation program lasting approximately 90 days to 6 months.  Justin Moore, is seen face to face by this provider, consulted with Dr. Lawrnce; and chart reviewed on 04/23/24.   On evaluation Justin Moore reports a psychiatric history of depression. He denies current suicidal or homicidal ideations. Pt does not appear to be in any acute distress. He is alert and oriented  x 4. Throughout the assessment, he remained calm, cooperative, and attentive. His mood is euthymic with a congruent affect; however, he appeared visibly saddened when discussing the loss of his wife and the impact of alcohol use on his life.  Speech and behavior were within normal limits. Objectively, there is no evidence of psychosis, mania, or delusional thinking. The patient does not appear to be responding to internal or external stimuli. He was able to converse coherently, with goal-directed thought processes. No distractibility or preoccupation was noted.  He denies suicidal ideation, self-harm, homicidal ideation, psychosis, and paranoia. The patient answered all questions appropriately and demonstrated insight and judgment consistent with his clinical presentation.     PHQ 2-9:   Flowsheet Row ED from 04/22/2024 in [facility] ED from 04/21/2024 in [facility] ED from 10/31/2022 in [facility] Emergency Department at [facility]  C-SSRS RISK CATEGORY No Risk No Risk No Risk    Screenings    Flowsheet Row Most Recent Value  CIWA-Ar Total 0    Total Time spent with patient: 45 minutes  Musculoskeletal  Strength & Muscle Tone: within normal limits Gait & Station: shuffle, reports needing left knee and hip replacement  Patient leans: Right  Psychiatric Specialty Exam  Presentation General Appearance:  Appropriate for Environment  Eye Contact: Good  Speech: Clear and Coherent; Normal  Rate  Speech Volume: Normal  Handedness: Right   Mood and Affect  Mood: Euthymic  Affect: Congruent   Thought Process  Thought Processes: Coherent; Goal Directed  Descriptions of Associations:Intact  Orientation:Full (Time,  Place and Person)  Thought Content:WDL  Diagnosis of Schizophrenia or Schizoaffective disorder in past: No   Hallucinations:Hallucinations: None  Ideas of Reference:None  Suicidal Thoughts:Suicidal Thoughts: No  Homicidal Thoughts:Homicidal Thoughts: No   Sensorium  Memory: Immediate Fair; Recent Fair  Judgment: Fair  Insight: Good   Executive Functions  Concentration: Fair  Attention Span: Fair  Recall: Fair  Fund of Knowledge: Fair  Language: Fair   Psychomotor Activity  Psychomotor Activity: Psychomotor Activity: Shuffling Gait (Reports needing L. hip replacement.)   Assets  Assets: Communication Skills; Desire for Improvement; Social Support   Sleep  Sleep: Sleep: Good   Nutritional Assessment (For OBS and FBC admissions only) Has the patient had a weight loss or gain of 10 pounds or more in the last 3 months?: No Has the patient had a decrease in food intake/or appetite?: No Does the patient have dental problems?: No Does the patient have eating habits or behaviors that may be indicators of an eating disorder including binging or inducing vomiting?: No Has the patient recently lost weight without trying?: 0 Has the patient been eating poorly because of a decreased appetite?: 0 Malnutrition Screening Tool Score: 0    Physical Exam Vitals reviewed.  Constitutional:      Appearance: Normal appearance.  HENT:     Head: Normocephalic and atraumatic.     Nose: Nose normal.     Mouth/Throat:     Pharynx: Oropharynx is clear.  Cardiovascular:     Rate and Rhythm: Normal rate.  Pulmonary:     Effort: Pulmonary effort is normal.  Musculoskeletal:        General: Normal range of motion.  Skin:    General: Skin is warm.  Neurological:     Mental Status: He is alert and oriented to person, place, and time.  Psychiatric:        Attention and Perception: Attention and perception normal.        Mood and Affect: Mood is depressed.         Speech: Speech normal.        Behavior: Behavior normal. Behavior is cooperative.        Thought Content: Thought content normal.        Cognition and Memory: Cognition and memory normal.        Judgment: Judgment normal.    Review of Systems  Psychiatric/Behavioral:  Positive for depression and substance abuse.   All other systems reviewed and are negative.   Blood pressure (!) 153/87, pulse 81, temperature 98.8 F (37.1 C), temperature source Oral, resp. rate 20, SpO2 100%. There is no height or weight on file to calculate BMI.  Past Psychiatric History: Depression, alcohol use disorder  Is the patient at risk to self? No  Has the patient been a risk to self in the past 6 months? No .    Has the patient been a risk to self within the distant past? No   Is the patient a risk to others? No   Has the patient been a risk to others in the past 6 months? No   Has the patient been a risk to others within the distant past? No   Past Medical History:  HTN, COPD, leukocytosis, hyperlipidemia Family History: Reports that his biological  father passed away at age 36 due to a stroke, and has a history of crack cocaine use. His mother is currently alive at age 58; her medical and psychiatric history is unknown. Social History: Widowed, 5 children ( 38, 36, 45, 33,32), works Chemical engineer highways, didn't complete H.S; experiencing homelessness.   Last Labs:  Admission on 04/22/2024  Component Date Value Ref Range Status   POC Amphetamine UR 04/23/2024 None Detected  NONE DETECTED (Cut Off Level 1000 ng/mL) Final   POC Secobarbital (BAR) 04/23/2024 None Detected  NONE DETECTED (Cut Off Level 300 ng/mL) Final   POC Buprenorphine (BUP) 04/23/2024 None Detected  NONE DETECTED (Cut Off Level 10 ng/mL) Final   POC Oxazepam (BZO) 04/23/2024 Positive (A)  NONE DETECTED (Cut Off Level 300 ng/mL) Final   POC Cocaine UR 04/23/2024 None Detected  NONE DETECTED (Cut Off Level 300 ng/mL) Final   POC  Methamphetamine UR 04/23/2024 None Detected  NONE DETECTED (Cut Off Level 1000 ng/mL) Final   POC Morphine 04/23/2024 None Detected  NONE DETECTED (Cut Off Level 300 ng/mL) Final   POC Methadone UR 04/23/2024 None Detected  NONE DETECTED (Cut Off Level 300 ng/mL) Final   POC Oxycodone  UR 04/23/2024 None Detected  NONE DETECTED (Cut Off Level 100 ng/mL) Final   POC Marijuana UR 04/23/2024 None Detected  NONE DETECTED (Cut Off Level 50 ng/mL) Final  Admission on 04/21/2024, Discharged on 04/22/2024  Component Date Value Ref Range Status   WBC 04/22/2024 10.4  4.0 - 10.5 K/uL Final   RBC 04/22/2024 4.17 (L)  4.22 - 5.81 MIL/uL Final   Hemoglobin 04/22/2024 13.7  13.0 - 17.0 g/dL Final   HCT 90/70/7974 37.2 (L)  39.0 - 52.0 % Final   MCV 04/22/2024 89.2  80.0 - 100.0 fL Final   MCH 04/22/2024 32.9  26.0 - 34.0 pg Final   MCHC 04/22/2024 36.8 (H)  30.0 - 36.0 g/dL Final   RDW 90/70/7974 14.4  11.5 - 15.5 % Final   Platelets 04/22/2024 372  150 - 400 K/uL Final   nRBC 04/22/2024 0.0  0.0 - 0.2 % Final   Neutrophils Relative % 04/22/2024 54  % Final   Neutro Abs 04/22/2024 5.7  1.7 - 7.7 K/uL Final   Lymphocytes Relative 04/22/2024 33  % Final   Lymphs Abs 04/22/2024 3.5  0.7 - 4.0 K/uL Final   Monocytes Relative 04/22/2024 8  % Final   Monocytes Absolute 04/22/2024 0.8  0.1 - 1.0 K/uL Final   Eosinophils Relative 04/22/2024 3  % Final   Eosinophils Absolute 04/22/2024 0.3  0.0 - 0.5 K/uL Final   Basophils Relative 04/22/2024 1  % Final   Basophils Absolute 04/22/2024 0.1  0.0 - 0.1 K/uL Final   Immature Granulocytes 04/22/2024 1  % Final   Abs Immature Granulocytes 04/22/2024 0.05  0.00 - 0.07 K/uL Final   Performed at [facility], [address], [place], [place] [address]   Sodium 04/22/2024 139  135 - 145 mmol/L Final   Potassium 04/22/2024 3.6  3.5 - 5.1 mmol/L Final   Chloride 04/22/2024 107  98 - 111 mmol/L Final   CO2 04/22/2024 22  22 - 32 mmol/L Final   Glucose, Bld  04/22/2024 97  70 - 99 mg/dL Final   Glucose reference range applies only to samples taken after fasting for at least 8 hours.   BUN 04/22/2024 15  6 - 20 mg/dL Final   Creatinine, Ser 04/22/2024 1.36 (H)  0.61 -  1.24 mg/dL Final   Calcium  04/22/2024 9.7  8.9 - 10.3 mg/dL Final   Total Protein 90/70/7974 7.1  6.5 - 8.1 g/dL Final   Albumin 90/70/7974 3.9  3.5 - 5.0 g/dL Final   AST 90/70/7974 27  15 - 41 U/L Final   ALT 04/22/2024 30  0 - 44 U/L Final   Alkaline Phosphatase 04/22/2024 54  38 - 126 U/L Final   Total Bilirubin 04/22/2024 0.3  0.0 - 1.2 mg/dL Final   GFR, Estimated 04/22/2024 >60  >60 mL/min Final   Comment: (NOTE) Calculated using the CKD-EPI Creatinine Equation (2021)    Anion gap 04/22/2024 10  5 - 15 Final   Performed at [facility], [address] [place], [place] [address]   Hgb A1c MFr Bld 04/22/2024 5.9 (H)  4.8 - 5.6 % Final   Comment: (NOTE) Diagnosis of Diabetes The following HbA1c ranges recommended by the American Diabetes Association (ADA) may be used as an aid in the diagnosis of diabetes mellitus.  Hemoglobin             Suggested A1C NGSP%              Diagnosis  <5.7                   Non Diabetic  5.7-6.4                Pre-Diabetic  >6.4                   Diabetic  <7.0                   Glycemic control for                       adults with diabetes.     Mean Plasma Glucose 04/22/2024 122.63  mg/dL Final   Performed at [facility], [address], [place], [place] [address]   Alcohol, Ethyl (B) 04/22/2024 <15  <15 mg/dL Final   Comment: (NOTE) For medical purposes only. Performed at [facility], [address], [place], [place] [address]    Cholesterol 04/22/2024 193  0 - 200 mg/dL Final   Triglycerides 90/70/7974 247 (H)  <150 mg/dL Final   HDL 90/70/7974 42  >40 mg/dL Final   Total CHOL/HDL Ratio 04/22/2024 4.6  RATIO Final   VLDL 04/22/2024 49 (H)  0 - 40 mg/dL Final   LDL Cholesterol 04/22/2024 102 (H)  0 -  99 mg/dL Final   Comment:        Total Cholesterol/HDL:CHD Risk Coronary Heart Disease Risk Table                     Men   Women  1/2 Average Risk   3.4   3.3  Average Risk       5.0   4.4  2 X Average Risk   9.6   7.1  3 X Average Risk  23.4   11.0        Use the calculated Patient Ratio above and the CHD Risk Table to determine the patient's CHD Risk.        ATP III CLASSIFICATION (LDL):  <100     mg/dL   Optimal  899-870  mg/dL   Near or Above  Optimal  130-159  mg/dL   Borderline  839-810  mg/dL   High  >809     mg/dL   Very High Performed at [facility], [address], [place], [place] [address]    TSH 04/22/2024 1.758  0.350 - 4.500 uIU/mL Final   Comment: Performed by a 3rd Generation assay with a functional sensitivity of <=0.01 uIU/mL. Performed at [facility], [address] [place], [place] [address]     Allergies: Patient has no known allergies.  Medications:  Facility Ordered Medications  Medication   acetaminophen  (TYLENOL ) tablet 650 mg   alum & mag hydroxide-simeth (MAALOX/MYLANTA) 200-200-20 MG/5ML suspension 30 mL   magnesium  hydroxide (MILK OF MAGNESIA) suspension 30 mL   haloperidol (HALDOL) tablet 5 mg   And   diphenhydrAMINE  (BENADRYL ) capsule 50 mg   haloperidol lactate (HALDOL) injection 5 mg   And   diphenhydrAMINE  (BENADRYL ) injection 50 mg   And   LORazepam  (ATIVAN ) injection 2 mg   haloperidol lactate (HALDOL) injection 10 mg   And   diphenhydrAMINE  (BENADRYL ) injection 50 mg   And   LORazepam  (ATIVAN ) injection 2 mg   hydrOXYzine  (ATARAX ) tablet 25 mg   traZODone (DESYREL) tablet 50 mg   nicotine  (NICODERM CQ  - dosed in mg/24 hours) patch 14 mg   albuterol  (VENTOLIN  HFA) 108 (90 Base) MCG/ACT inhaler 2 puff   famotidine (PEPCID) tablet 20 mg   losartan  (COZAAR ) tablet 100 mg   ibuprofen (ADVIL) tablet 600 mg   amLODipine  (NORVASC ) tablet 10 mg   hydrALAZINE  (APRESOLINE ) tablet 100 mg    multivitamin with minerals tablet 1 tablet   [COMPLETED] thiamine  (VITAMIN B1) injection 100 mg   chlordiazePOXIDE  (LIBRIUM ) capsule 25 mg   hydrOXYzine  (ATARAX ) tablet 25 mg   loperamide (IMODIUM) capsule 2-4 mg   ondansetron  (ZOFRAN -ODT) disintegrating tablet 4 mg   chlordiazePOXIDE  (LIBRIUM ) capsule 25 mg   Followed by   NOREEN ON 04/24/2024] chlordiazePOXIDE  (LIBRIUM ) capsule 25 mg   Followed by   NOREEN ON 04/25/2024] chlordiazePOXIDE  (LIBRIUM ) capsule 25 mg   Followed by   NOREEN ON 04/26/2024] chlordiazePOXIDE  (LIBRIUM ) capsule 25 mg    Long Term Goals: Improvement in symptoms so as ready for discharge  Short Term Goals: Patient will verbalize feelings in meetings with treatment team members., Patient will attend at least of 50% of the groups daily., Pt will complete the PHQ9 on admission, day 3 and discharge., Patient will participate in completing the [place] Suicide Severity Rating Scale, Patient will score a low risk of violence for 24 hours prior to discharge, and Patient will take medications as prescribed daily.  Medical Decision Making  Pt has been admitted to [facility]  Discharge Planning:              -- Social work and case management to assist with discharge planning and identification of hospital follow-up needs prior to discharge             -- Estimated LOS: 3-5 days             -- Discharge Concerns: Need to establish a safety plan; Medication compliance and effectiveness             -- Discharge Goals: Return home with outpatient referrals for mental health follow-up including medication management/psychotherapy   Basic labs ordered yesterday including CBC, CMP, glucose level, UDS, Ethanol, EKG Admission orders were placed  PRNS ordered yesterday -Continue Tylenol  650 mg every 6 hours PRN for  mild pain -Continue Maalox 30 mg every 4 hrs PRN for indigestion -Continue Milk of Magnesia as needed every 6 hrs for constipation -Continue Atarax  25mg  PO TIDPRN -  anxiety -Continue Albuterol  2 puffs Q 6 HRS PRN - SOB/Wheezing -Continue Trazodone 50mg  PO PRN QHS Agitation protocol ordered CIWA protocol ordered  Home Meds already restarted:  Famotidine 20 mg PO daily Hydralazine  100 mg PO TID Losartan  100 mg PO daily MTI 1 tablet daily Amlodipine  10mg  PO daily Recommendations  Based on my evaluation the patient does not appear to have an emergency medical condition.  Tosin Terris Germano, NP 04/23/24  2:05 PM

## 2024-04-23 NOTE — ED Notes (Signed)
 pt had initial manual BP of 166/98. According to pt, his BP is always high. Writer asked him if he exercises, and he stated he is going to have hip surgery soon and after that he will start going to the gym. HydrALAZINE  was administered as scheduled and BP retake and currently BP down to 140/78. Thank you.

## 2024-04-23 NOTE — Care Management (Addendum)
 [facility] Care Management  Writer met with the patient and discussed discharge planning.  Patient continues to requests inpatient substance abuse treatment.    Writer will follow up on the faxed referrals to [facility], [facility] and [facility].    Writer reminded patient that the [facility] is set up for him to be here for 3 to 5 days then discharge to the community.    Patient reports that he does not need any shelter resources.  Patient reports that he is able to live with his sister or his mother once he is discharged.    4:30pm  Patient completed his screening with [facility].  Per intake worker the referral will be staffed by their nursing staff in the morning to make a decision of acceptance to their program.    Writer will contact the facility in the morning.

## 2024-04-23 NOTE — ED Notes (Signed)
 Patient A&Ox4. Denies intent to harm self/others when asked. Denies A/VH. Patient c/o  L hip pain. Pt received Ibuprofen for sx management. Support and encouragement provided. Routine safety checks conducted according to facility protocol. Encouraged patient to notify staff if thoughts of harm toward self or others arise. Patient verbalize understanding and agreement. Pt request to speak with SW this am re: POC. SW made aware. Will continue to monitor for safety.

## 2024-04-23 NOTE — Group Note (Signed)
 Group Topic: Decisional Balance/Substance Abuse  Group Date: 04/23/2024 Start Time: 1300 End Time: 1345 Facilitators: Alyse Leilani LABOR, NT  Department: [facility]  Number of Participants: 8  Group Focus: activities of daily living skills, affirmation, and clarity of thought Treatment Modality:  Skills Training Interventions utilized were group exercise and mental fitness Purpose: enhance coping skills, express feelings, and reinforce self-care  Name: Justin Moore Date of Birth: [birth year]  MR: [id number]    Level of Participation: active Quality of Participation: engaged, motivated, and supportive Interactions with others: gave feedback Mood/Affect: positive Triggers (if applicable): none Cognition: goal directed and insightful Progress: Gaining insight Response: none Plan: patient will be encouraged to keep going to groups while your here and when you get discharged  Patients Problems:  Patient Active Problem List   Diagnosis Date Noted   Alcohol withdrawal delirium, acute, hyperactive (HCC) 12/26/2021   Thrombocytopenia 12/26/2021   Hypokalemia 12/26/2021   Hypertensive emergency 12/26/2021   Alcohol withdrawal (HCC) 12/26/2021   Alcoholic hepatitis without ascites 05/20/2021   Alcohol use disorder 05/20/2021   Smoking 12/05/2019   Elevated lipids 12/05/2019   Leukocytosis 12/05/2019   Encounter to establish care 11/28/2019   Essential hypertension 11/28/2019

## 2024-04-23 NOTE — ED Notes (Signed)
 Pt sitting in dayroom watching television and interacting with peers. No acute distress noted. No concerns voiced. Informed pt to notify staff with any needs or assistance. Pt verbalized understanding and agreement. Will continue to monitor for safety.

## 2024-04-23 NOTE — ED Notes (Signed)
 Patient asleep at this time. NAD. Will continue to monitor for safety.

## 2024-04-24 DIAGNOSIS — F1490 Cocaine use, unspecified, uncomplicated: Secondary | ICD-10-CM | POA: Diagnosis not present

## 2024-04-24 DIAGNOSIS — F1020 Alcohol dependence, uncomplicated: Secondary | ICD-10-CM | POA: Diagnosis not present

## 2024-04-24 DIAGNOSIS — F330 Major depressive disorder, recurrent, mild: Secondary | ICD-10-CM | POA: Diagnosis not present

## 2024-04-24 DIAGNOSIS — I10 Essential (primary) hypertension: Secondary | ICD-10-CM | POA: Diagnosis not present

## 2024-04-24 MED ORDER — LOSARTAN POTASSIUM 50 MG PO TABS: 50.0000 mg | ORAL_TABLET | Freq: Every day | ORAL | 0 refills | Status: DC

## 2024-04-24 MED ORDER — NICOTINE 14 MG/24HR TD PT24: 14.0000 mg | MEDICATED_PATCH | Freq: Every day | TRANSDERMAL | 0 refills | Status: DC

## 2024-04-24 MED ORDER — AMLODIPINE BESYLATE 10 MG PO TABS
10.0000 mg | ORAL_TABLET | Freq: Every day | ORAL | 0 refills | Status: DC
Start: 2024-04-24 — End: 2024-05-13

## 2024-04-24 MED ORDER — HYDRALAZINE HCL 100 MG PO TABS: 100.0000 mg | ORAL_TABLET | Freq: Three times a day (TID) | ORAL | 2 refills | Status: AC

## 2024-04-24 MED ORDER — LIDOCAINE 5 % EX PTCH
1.0000 | MEDICATED_PATCH | CUTANEOUS | Status: DC
  Administered 2024-04-24: 1 via TRANSDERMAL
  Filled 2024-04-24: qty 1

## 2024-04-24 MED ORDER — ALBUTEROL SULFATE HFA 108 (90 BASE) MCG/ACT IN AERS: 2.0000 | INHALATION_SPRAY | Freq: Four times a day (QID) | RESPIRATORY_TRACT | 0 refills | Status: AC | PRN

## 2024-04-24 MED ORDER — FAMOTIDINE 20 MG PO TABS: 20.0000 mg | ORAL_TABLET | Freq: Every day | ORAL | 0 refills | Status: DC

## 2024-04-24 MED ORDER — HYDROXYZINE HCL 25 MG PO TABS: 25.0000 mg | ORAL_TABLET | Freq: Three times a day (TID) | ORAL | 0 refills | Status: AC | PRN

## 2024-04-24 NOTE — ED Notes (Signed)
 Pt currently attending AA meeting. He is attentive and engaged. No needs noted at this time.

## 2024-04-24 NOTE — ED Notes (Signed)
 Pt observed lying in bed. Eyes closed respirations even and non labored. NAD q 15 minute observations continue for safety.

## 2024-04-24 NOTE — ED Notes (Signed)
 Pt has OOB in day room.  Pt attending AA group.  Calm and cooperative. Denied current SI plan and intent.  Denied HI and A/V hallucinations Q 15 minute observations for safety continue

## 2024-04-24 NOTE — ED Provider Notes (Signed)
 FBC/OBS ASAP Discharge Summary  Date and Time: 04/24/2024 5:29 PM  Name: Justin Moore  MRN:  [id number]   Discharge Diagnoses:  Final diagnoses:  Mild episode of recurrent major depressive disorder  Essential hypertension  Alcohol use disorder, severe, dependence (HCC)    Subjective: I'll try [facility] for 28 days, if I don't like it I can leave  Admission Summary per H&P:  Justin Moore is a 58 year old male, with a lifelong history of chronic alcohol use disorder, complicated grief, ineffective coping admitted to facility based crisis for detox of alcohol on 04/22/2024.  Patient was placed on a Librium  taper however has had 0 CIWA score over the last 24 hours and taper was discontinued on today.  He denies any withdrawal symptoms related to abstaining for alcohol use.  Last alcohol intake was on 04/21/2024, which she reports that he drinks a 20 ounce can of beer.  Ethanol level less than 15.  Patient has a history of hypertension and a history of a stroke blood pressure medications were restarted and BP was moderately stable during admission without any episodes of hypertensive urgency.  Patient was notified today that he was accepted to [facility] residential recovery program and patient is motivated to change although has some apprehension about going to this program as he desired to be excepted into a different program however there is no bed availability.  Patient reports he will at least go to the first 28 to 30 days and if he does not like the program he will independently make efforts to transition to a different program. Patient denies any symptoms of depression, anxiety or any other psychiatric concerns.  Patient's PHQ-9 scored 0.  Patient denies any suicidal, homicidal ideations and denies any psychotic symptoms of auditory visual hallucination and does not appear to be paranoid or delusional. Total Time spent with patient: 45 minutes  Tobacco Cessation:  A prescription for an  FDA-approved tobacco cessation medication provided at discharge  Current Medications:  Current Facility-Administered Medications  Medication Dose Route Frequency Provider Last Rate Last Admin   acetaminophen  (TYLENOL ) tablet 650 mg  650 mg Oral Q6H PRN Delsie Lynwood Morene Lavone, MD   650 mg at 04/24/24 1255   albuterol  (VENTOLIN  HFA) 108 (90 Base) MCG/ACT inhaler 2 puff  2 puff Inhalation Q6H PRN Delsie Lynwood Morene Lavone, MD       alum & mag hydroxide-simeth (MAALOX/MYLANTA) 200-200-20 MG/5ML suspension 30 mL  30 mL Oral Q4H PRN Delsie Lynwood Morene Lavone, MD       amLODipine  (NORVASC ) tablet 10 mg  10 mg Oral Daily Delsie Lynwood Morene Lavone, MD   10 mg at 04/24/24 9093   chlordiazePOXIDE  (LIBRIUM ) capsule 25 mg  25 mg Oral Q6H PRN Gottfried, Rhoda J, MD       haloperidol (HALDOL) tablet 5 mg  5 mg Oral TID PRN Delsie Lynwood Morene Lavone, MD       And   diphenhydrAMINE  (BENADRYL ) capsule 50 mg  50 mg Oral TID PRN Delsie Lynwood Morene Lavone, MD       haloperidol lactate (HALDOL) injection 5 mg  5 mg Intramuscular TID PRN Delsie Lynwood Morene Lavone, MD       And   diphenhydrAMINE  (BENADRYL ) injection 50 mg  50 mg Intramuscular TID PRN Delsie Lynwood Morene Lavone, MD       And   LORazepam  (ATIVAN ) injection 2 mg  2 mg Intramuscular TID PRN Delsie Lynwood Morene Lavone, MD  haloperidol lactate (HALDOL) injection 10 mg  10 mg Intramuscular TID PRN Delsie Lynwood Morene Lavone, MD       And   diphenhydrAMINE  (BENADRYL ) injection 50 mg  50 mg Intramuscular TID PRN Delsie Lynwood Morene Lavone, MD       And   LORazepam  (ATIVAN ) injection 2 mg  2 mg Intramuscular TID PRN Delsie Lynwood Morene Lavone, MD       famotidine (PEPCID) tablet 20 mg  20 mg Oral Daily Delsie Lynwood Morene Lavone, MD   20 mg at 04/24/24 9093   hydrALAZINE  (APRESOLINE ) tablet 100 mg  100 mg Oral TID Delsie Lynwood Morene Lavone, MD   100 mg at 04/24/24 1547    hydrOXYzine  (ATARAX ) tablet 25 mg  25 mg Oral TID PRN Delsie Lynwood Morene Lavone, MD   25 mg at 04/22/24 2126   hydrOXYzine  (ATARAX ) tablet 25 mg  25 mg Oral Q6H PRN Gottfried, Rhoda J, MD       loperamide (IMODIUM) capsule 2-4 mg  2-4 mg Oral PRN Gottfried, Rhoda J, MD       losartan  (COZAAR ) tablet 100 mg  100 mg Oral Daily Delsie Lynwood Morene Lavone, MD   100 mg at 04/24/24 9094   magnesium  hydroxide (MILK OF MAGNESIA) suspension 30 mL  30 mL Oral Daily PRN Delsie Lynwood Morene Lavone, MD       multivitamin with minerals tablet 1 tablet  1 tablet Oral Daily Delsie Lynwood Morene Lavone, MD   1 tablet at 04/24/24 9094   nicotine  (NICODERM CQ  - dosed in mg/24 hours) patch 14 mg  14 mg Transdermal Daily Delsie Lynwood Morene Lavone, MD   14 mg at 04/24/24 9090   ondansetron  (ZOFRAN -ODT) disintegrating tablet 4 mg  4 mg Oral Q6H PRN Gottfried, Rhoda J, MD       traZODone (DESYREL) tablet 50 mg  50 mg Oral QHS PRN Delsie Lynwood Morene Lavone, MD   50 mg at 04/23/24 2121   Current Outpatient Medications  Medication Sig Dispense Refill   amLODipine  (NORVASC ) 10 MG tablet Take 1 tablet (10 mg total) by mouth daily. 30 tablet 0   albuterol  (VENTOLIN  HFA) 108 (90 Base) MCG/ACT inhaler Inhale 2 puffs into the lungs every 6 (six) hours as needed for wheezing or shortness of breath. 8 g 0   famotidine (PEPCID) 20 MG tablet Take 1 tablet (20 mg total) by mouth daily. 30 tablet 0   hydrALAZINE  (APRESOLINE ) 100 MG tablet Take 1 tablet (100 mg total) by mouth 3 (three) times daily. 90 tablet 2   hydrOXYzine  (ATARAX ) 25 MG tablet Take 1 tablet (25 mg total) by mouth every 8 (eight) hours as needed for anxiety. 90 tablet 0   losartan  (COZAAR ) 50 MG tablet Take 1 tablet (50 mg total) by mouth daily. 30 tablet 0   [START ON 04/25/2024] nicotine  (NICODERM CQ  - DOSED IN MG/24 HOURS) 14 mg/24hr patch Place 1 patch (14 mg total) onto the skin daily. 28 patch 0    PTA Medications:  Facility  Ordered Medications  Medication   acetaminophen  (TYLENOL ) tablet 650 mg   alum & mag hydroxide-simeth (MAALOX/MYLANTA) 200-200-20 MG/5ML suspension 30 mL   magnesium  hydroxide (MILK OF MAGNESIA) suspension 30 mL   haloperidol (HALDOL) tablet 5 mg   And   diphenhydrAMINE  (BENADRYL ) capsule 50 mg   haloperidol lactate (HALDOL) injection 5 mg   And   diphenhydrAMINE  (BENADRYL ) injection 50 mg   And   LORazepam  (ATIVAN ) injection 2 mg  haloperidol lactate (HALDOL) injection 10 mg   And   diphenhydrAMINE  (BENADRYL ) injection 50 mg   And   LORazepam  (ATIVAN ) injection 2 mg   hydrOXYzine  (ATARAX ) tablet 25 mg   traZODone (DESYREL) tablet 50 mg   nicotine  (NICODERM CQ  - dosed in mg/24 hours) patch 14 mg   albuterol  (VENTOLIN  HFA) 108 (90 Base) MCG/ACT inhaler 2 puff   famotidine (PEPCID) tablet 20 mg   losartan  (COZAAR ) tablet 100 mg   amLODipine  (NORVASC ) tablet 10 mg   hydrALAZINE  (APRESOLINE ) tablet 100 mg   multivitamin with minerals tablet 1 tablet   [COMPLETED] thiamine  (VITAMIN B1) injection 100 mg   chlordiazePOXIDE  (LIBRIUM ) capsule 25 mg   hydrOXYzine  (ATARAX ) tablet 25 mg   loperamide (IMODIUM) capsule 2-4 mg   ondansetron  (ZOFRAN -ODT) disintegrating tablet 4 mg   [COMPLETED] chlordiazePOXIDE  (LIBRIUM ) capsule 25 mg   PTA Medications  Medication Sig   amLODipine  (NORVASC ) 10 MG tablet Take 1 tablet (10 mg total) by mouth daily.   hydrALAZINE  (APRESOLINE ) 100 MG tablet Take 1 tablet (100 mg total) by mouth 3 (three) times daily.   losartan  (COZAAR ) 50 MG tablet Take 1 tablet (50 mg total) by mouth daily.   famotidine (PEPCID) 20 MG tablet Take 1 tablet (20 mg total) by mouth daily.   albuterol  (VENTOLIN  HFA) 108 (90 Base) MCG/ACT inhaler Inhale 2 puffs into the lungs every 6 (six) hours as needed for wheezing or shortness of breath.   hydrOXYzine  (ATARAX ) 25 MG tablet Take 1 tablet (25 mg total) by mouth every 8 (eight) hours as needed for anxiety.   [START ON 04/25/2024]  nicotine  (NICODERM CQ  - DOSED IN MG/24 HOURS) 14 mg/24hr patch Place 1 patch (14 mg total) onto the skin daily.       04/23/2024    2:02 PM 05/25/2020    9:59 AM 08/25/2017   10:02 AM  Depression screen PHQ 2/9  Decreased Interest 0 0 0  Down, Depressed, Hopeless 0 0 0  PHQ - 2 Score 0 0 0    Flowsheet Row ED from 04/22/2024 in [facility] ED from 04/21/2024 in [facility] ED from 10/31/2022 in [facility] Emergency Department at [facility]  C-SSRS RISK CATEGORY No Risk No Risk No Risk    Musculoskeletal  Strength & Muscle Tone: within normal limits Gait & Station: normal Patient leans: N/  Psychiatric Specialty Exam  Presentation  General Appearance:  Appropriate for Environment  Eye Contact: Good  Speech: Clear and Coherent; Normal Rate  Speech Volume: Normal  Handedness: Right   Mood and Affect  Mood: Euthymic  Affect: Congruent   Thought Process  Thought Processes: Coherent; Goal Directed  Descriptions of Associations:Intact  Orientation:Full (Time, Place and Person)  Thought Content:WDL  Diagnosis of Schizophrenia or Schizoaffective disorder in past: No    Hallucinations:Hallucinations: None  Ideas of Reference:None  Suicidal Thoughts:Suicidal Thoughts: No  Homicidal Thoughts:Homicidal Thoughts: No   Sensorium  Memory: Immediate Fair; Recent Fair  Judgment: Fair  Insight: Good   Executive Functions  Concentration: Fair  Attention Span: Fair  Recall: Fair  Fund of Knowledge: Fair  Language: Fair   Psychomotor Activity  Psychomotor Activity: Psychomotor Activity: Shuffling Gait (Reports needing L. hip replacement.)   Assets  Assets: Communication Skills; Desire for Improvement; Social Support   Sleep  Sleep: Sleep: Good  Estimated Sleeping Duration (Last 24 Hours): 11.50 hours  No data recorded  Physical Exam  Physical Exam Constitutional:  General: He is not in acute distress.    Appearance: Normal appearance. He is not ill-appearing.  HENT:     Head: Normocephalic and atraumatic.     Nose: Nose normal.  Eyes:     Extraocular Movements: Extraocular movements intact.     Conjunctiva/sclera: Conjunctivae normal.     Pupils: Pupils are equal, round, and reactive to light.  Cardiovascular:     Rate and Rhythm: Normal rate and regular rhythm.  Pulmonary:     Effort: Pulmonary effort is normal.     Breath sounds: Normal breath sounds.  Musculoskeletal:     Cervical back: Normal range of motion and neck supple.  Skin:    General: Skin is warm and dry.  Neurological:     General: No focal deficit present.     Mental Status: He is alert and oriented to person, place, and time.     Review of Systems  Psychiatric/Behavioral:  Positive for substance abuse (ETOH dependence). Negative for depression and suicidal ideas. The patient does not have insomnia.    Blood pressure 139/89, pulse 78, temperature 98.4 F (36.9 C), temperature source Oral, resp. rate 18, SpO2 100%. There is no height or weight on file to calculate BMI.  Demographic Factors:  Male and Low socioeconomic status  Loss Factors: Financial problems/change in socioeconomic status  Historical Factors: Impulsivity  Risk Reduction Factors:   NA  Continued Clinical Symptoms:  Alcohol/Substance Abuse/Dependencies  Cognitive Features That Contribute To Risk:  None    Suicide Risk:  Minimal: No identifiable suicidal ideation.  Patients presenting with no risk factors but with morbid ruminations; may be classified as minimal risk based on the severity of the depressive symptoms  Plan Of Care/Follow-up recommendations:  Activity:  Activity as tolerated Diet:  Heart healthy Tests:  Labs reviewed no acute follow-up indicated patient should continue follow-up with her primary care provider for management of chronic conditions such as hypertension. Other:   7-day supply of medications along with 30-day prescriptions to accompany with patient today [facility] for admission.  Disposition: [facility]   Suzen Lesches, NP 04/24/2024, 5:29 PM

## 2024-04-24 NOTE — Discharge Instructions (Addendum)
   Per Rosaline, patient was accepted to [facility] on Thursday 04-25-24  at 9am.   [address]  [place], [place]  [phone]    Patient and the MD have been informed.

## 2024-04-24 NOTE — Group Note (Signed)
 Group Topic: Relapse and Recovery  Group Date: 04/24/2024 Start Time: 2000 End Time: 2100 Facilitators: Joan Plowman B  Department: [facility]  Number of Participants: 10  Group Focus: abuse issues, chemical dependency education, chemical dependency issues, coping skills, daily focus, and dual diagnosis Treatment Modality:  Leisure Counsellor, Patient-Centered Therapy, Psychoeducation, and Spiritual Interventions utilized were leisure development, patient education, and support Purpose: enhance coping skills, increase insight, relapse prevention strategies, and trigger / craving management  Name: Justin Moore Date of Birth: 10-22-1965  MR: [id number]    Level of Participation: active Quality of Participation: attentive and cooperative Interactions with others: gave feedback Mood/Affect: appropriate Triggers (if applicable): NA Cognition: coherent/clear Progress: Gaining insight Response: NA Plan: patient will be encouraged to keep going to groups  Patients Problems:  Patient Active Problem List   Diagnosis Date Noted   Alcohol withdrawal delirium, acute, hyperactive (HCC) 12/26/2021   Thrombocytopenia 12/26/2021   Hypokalemia 12/26/2021   Hypertensive emergency 12/26/2021   Alcohol withdrawal (HCC) 12/26/2021   Alcoholic hepatitis without ascites (HCC) 05/20/2021   Alcohol use disorder 05/20/2021   Smoking 12/05/2019   Elevated lipids 12/05/2019   Leukocytosis 12/05/2019   Encounter to establish care 11/28/2019   Essential hypertension 11/28/2019

## 2024-04-24 NOTE — ED Notes (Signed)
 Pt rates depression 0/10 and anxiety 2/10. Pt reports a good appetite, and no physical problems. Pt denies SI/HI/AVH and verbally contracts for safety. No acute s/s of alcohol withdrawal noted. Provided support and encouragement. Pt safe on the unit. Q 15 minute safety checks continued.

## 2024-04-24 NOTE — ED Notes (Signed)
 Pt is sleeping currently. No acute distress noted.

## 2024-04-24 NOTE — Care Management (Incomplete)
 [facility] Care Management  Patient has been accepted to [facility], per [facility].

## 2024-04-24 NOTE — Care Management (Signed)
 [facility] Care Management   Per Rosaline, patient was accepted to [facility] on Thursday 04-25-24  at 9am.   [address]  [place], [place]  [phone]    Patient and the MD have been informed.

## 2024-04-24 NOTE — Group Note (Signed)
 Group Topic: Recovery Basics  Group Date: 04/24/2024 Start Time: 1100 End Time: 1130 Facilitators: Carletha Iha, RN  Department: [facility]  Number of Participants: 6  Group Focus: nursing group Treatment Modality:  Psychoeducation Interventions utilized were patient education Purpose:  Medication Education Name: Justin Moore Date of Birth: 1966-03-21  MR: [id number]    Level of Participation: moderate Quality of Participation: attentive Interactions with others: gave feedback Mood/Affect: appropriate Triggers (if applicable):  Cognition: insightful Progress: Moderate Response:  Plan: patient will be encouraged to continue to take medications after discharge  Patients Problems:  Patient Active Problem List   Diagnosis Date Noted   Alcohol withdrawal delirium, acute, hyperactive (HCC) 12/26/2021   Thrombocytopenia 12/26/2021   Hypokalemia 12/26/2021   Hypertensive emergency 12/26/2021   Alcohol withdrawal (HCC) 12/26/2021   Alcoholic hepatitis without ascites (HCC) 05/20/2021   Alcohol use disorder 05/20/2021   Smoking 12/05/2019   Elevated lipids 12/05/2019   Leukocytosis 12/05/2019   Encounter to establish care 11/28/2019   Essential hypertension 11/28/2019

## 2024-04-24 NOTE — Group Note (Signed)
 Group Topic: Healthy Self Image and Positive Change  Group Date: 04/24/2024 Start Time: 1430 End Time: 1530 Facilitators: Liston Cooper PARAS, NT; Linnea Rob, NT  Department: [facility]  Number of Participants: 8  Group Focus: community group Treatment Modality:  Psychoeducation Interventions utilized were patient education Purpose: regain self-worth  Name: Justin Moore Date of Birth: 1966/01/30  MR: [id number]    Level of Participation: active Quality of Participation: attentive Interactions with others: gave feedback Mood/Affect: appropriate Triggers (if applicable): n/a Cognition: coherent/clear Progress: Moderate Response: n/a Plan: follow-up needed  Patients Problems:  Patient Active Problem List   Diagnosis Date Noted   Alcohol withdrawal delirium, acute, hyperactive (HCC) 12/26/2021   Thrombocytopenia 12/26/2021   Hypokalemia 12/26/2021   Hypertensive emergency 12/26/2021   Alcohol withdrawal (HCC) 12/26/2021   Alcoholic hepatitis without ascites (HCC) 05/20/2021   Alcohol use disorder 05/20/2021   Smoking 12/05/2019   Elevated lipids 12/05/2019   Leukocytosis 12/05/2019   Encounter to establish care 11/28/2019   Essential hypertension 11/28/2019

## 2024-04-25 DIAGNOSIS — F1490 Cocaine use, unspecified, uncomplicated: Secondary | ICD-10-CM | POA: Diagnosis not present

## 2024-04-25 DIAGNOSIS — F1020 Alcohol dependence, uncomplicated: Secondary | ICD-10-CM | POA: Diagnosis not present

## 2024-04-25 DIAGNOSIS — F330 Major depressive disorder, recurrent, mild: Secondary | ICD-10-CM | POA: Diagnosis not present

## 2024-04-25 DIAGNOSIS — I10 Essential (primary) hypertension: Secondary | ICD-10-CM | POA: Diagnosis not present

## 2024-04-25 NOTE — ED Provider Notes (Signed)
 Stay Summary per H&P:  DIGBY GROENEVELD is a 58 year old male, with a lifelong history of chronic alcohol use disorder, complicated grief, ineffective coping admitted to facility based crisis for detox of alcohol on 04/22/2024.  Patient was placed on a Librium  taper however has had 0 CIWA score over the last 24 hours and taper was discontinued on today.  He denies any withdrawal symptoms related to abstaining for alcohol use.  Last alcohol intake was on 04/21/2024, which she reports that he drinks a 20 ounce can of beer.  Ethanol level less than 15.  Patient has a history of hypertension and a history of a stroke blood pressure medications were restarted and BP was moderately stable during admission without any episodes of hypertensive urgency.  Patient was notified today that he was accepted to [facility] residential recovery program and patient is motivated to change although has some apprehension about going to this program as he desired to be excepted into a different program however there is no bed availability.    Assessment: TOWNES FUHS 58 y.o., male patient admitted to [facility] on 04/23/24 for alcohol use disorder. Upon admission, his BAL was negative and UDS was positive for Benzodiazepines. BORUCH MANUELE, is seen face to face by this provider, and chart reviewed on 04/25/24.   On evaluation MORRISON MCBRYAR reports that he will be discharging today and being transported to [facility] for Residential treatment to help him remain sober and decrease risk for alcohol relapse. Pt has already been set up for discharge and feels ready for discharge. He was given a 7 day supply of medications and paper scripts. Pt is alert and oriented, calm and cooperative for brief assessment. Pt safety concerns at this time including SI, HI and psychotic symptoms. He denies current physical or medical complaints, other than chronic hip pain for which is giving medication for. No concerns for complicated withdrawals. Pt denies any  questions and concerns at this time.   Disposition: -Pt transported to [facility] via taxi.  -7 day supply of medications and paper scripts provided as requested by [facility].

## 2024-04-25 NOTE — ED Notes (Signed)
 Stable. A&O x 4.  Discharging to [facility] via [employer] [facility]    Pt provided with home medication and script.   Denies current SI plan and Intent.  Denies HI and A/V hallucinations.   All belongings returned to PT.  F/U instruction reviewed   Pt verbalized understanding

## 2024-04-25 NOTE — ED Notes (Signed)
 Pt observed lying in bed. Eyes closed respirations even and non labored. NAD q 15 minute observations continue for safety.

## 2024-04-29 ENCOUNTER — Ambulatory Visit: Payer: MEDICAID | Admitting: Physician Assistant

## 2024-04-29 VITALS — BP 137/89 | HR 90 | Ht 68.0 in | Wt 288.0 lb

## 2024-04-29 DIAGNOSIS — I10 Essential (primary) hypertension: Secondary | ICD-10-CM

## 2024-04-29 DIAGNOSIS — R7303 Prediabetes: Secondary | ICD-10-CM

## 2024-04-29 DIAGNOSIS — M25562 Pain in left knee: Secondary | ICD-10-CM

## 2024-04-29 DIAGNOSIS — F1010 Alcohol abuse, uncomplicated: Secondary | ICD-10-CM

## 2024-04-29 DIAGNOSIS — M25552 Pain in left hip: Secondary | ICD-10-CM

## 2024-04-29 DIAGNOSIS — E782 Mixed hyperlipidemia: Secondary | ICD-10-CM

## 2024-04-29 DIAGNOSIS — Z1159 Encounter for screening for other viral diseases: Secondary | ICD-10-CM

## 2024-04-29 DIAGNOSIS — G8929 Other chronic pain: Secondary | ICD-10-CM | POA: Diagnosis not present

## 2024-04-29 DIAGNOSIS — E785 Hyperlipidemia, unspecified: Secondary | ICD-10-CM | POA: Diagnosis not present

## 2024-04-29 DIAGNOSIS — Z125 Encounter for screening for malignant neoplasm of prostate: Secondary | ICD-10-CM | POA: Diagnosis not present

## 2024-04-29 DIAGNOSIS — F1090 Alcohol use, unspecified, uncomplicated: Secondary | ICD-10-CM

## 2024-04-29 MED ORDER — ATORVASTATIN CALCIUM 10 MG PO TABS: 10.0000 mg | ORAL_TABLET | Freq: Every day | ORAL | 2 refills | Status: DC

## 2024-04-29 MED ORDER — KETOROLAC TROMETHAMINE 60 MG/2ML IM SOLN
60.0000 mg | Freq: Once | INTRAMUSCULAR | Status: AC
  Administered 2024-04-29: 60 mg via INTRAMUSCULAR

## 2024-04-29 MED ORDER — MELOXICAM 15 MG PO TABS: 15.0000 mg | ORAL_TABLET | Freq: Every day | ORAL | 0 refills | Status: DC

## 2024-04-29 NOTE — Patient Instructions (Signed)
 VISIT SUMMARY:  During today's visit, we discussed your chronic pain in the left hip and knee, which has been affecting your daily activities. We also reviewed your elevated blood pressure, cholesterol levels, and prediabetes. Additionally, we addressed the need for overdue screenings for prostate, colorectal, and lung cancer.  YOUR PLAN:  -LEFT HIP AND LEFT KNEE OSTEOARTHRITIS WITH CHRONIC PAIN: Osteoarthritis is a condition where the protective cartilage that cushions the ends of your bones wears down over time, causing pain and stiffness. We have prescribed meloxicam 15 mg once daily to help manage your pain and administered a Ketorolac  injection for immediate relief. You will also be referred to orthopedics for further evaluation and management. An Ace bandage wrap has been provided for additional support.  -HYPERTENSION: Hypertension, or high blood pressure, is a condition where the force of the blood against your artery walls is too high, which can lead to health problems. Your blood pressure is improving with medication and lifestyle changes. Please monitor your blood pressure daily and follow up in two weeks to assess control and adjust medications if necessary.  -HYPERLIPIDEMIA: Hyperlipidemia is a condition where there are high levels of fats (lipids) in your blood, increasing your risk of cardiovascular disease. We have prescribed atorvastatin  10 mg daily to help lower your cholesterol levels. Please recheck your lipid levels in 3 to 6 months.  -PREDIABETES: Prediabetes is a condition where your blood sugar levels are higher than normal but not yet high enough to be diagnosed as diabetes. We will recheck your A1c in 2.5 months. In the meantime, please avoid sugary drinks and maintain a healthy diet.  -PROSTATE CANCER SCREENING: Prostate cancer screening is important for early detection of prostate cancer. We have ordered a PSA blood test to screen for prostate cancer.  -GENERAL HEALTH  MAINTENANCE: You are overdue for colorectal and lung cancer screenings. We have ordered a hepatitis C screening and discussed the need for future colonoscopy and lung cancer screening. Please continue to avoid smoking and maintain a healthy lifestyle.

## 2024-04-29 NOTE — Progress Notes (Unsigned)
 New Patient Office Visit  Subjective    Patient ID: Justin Moore, male    DOB: 1966/05/27  Age: 58 y.o. MRN: [id number]  CC:  Chief Complaint  Patient presents with   Hip Pain    He is having left hip pain and knee pain this a chronic issue from past injury    Discussed the use of AI scribe software for clinical note transcription with the patient, who gave verbal consent to proceed.  History of Present Illness   Justin Moore is a 58 year old male.  He is currently being treated for substance abuse at [facility] recovery center  He experiences chronic pain in his left hip and knee, with the knee pain originating from an untreated high school injury and the hip pain developing over the past few years. 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  He uses ibuprofen for pain relief, taking up to six to eight tablets at a time, but finds it ineffective unless taken in large quantities. Currently, he takes two Tylenol  in the morning and two at night, but continues to experience significant pain throughout the day, affecting his daily activities.  He has elevated blood pressure and was recently restarted on medication. He also has elevated triglycerides and LDL cholesterol, attributed to past alcohol use, but is currently not drinking. He smoked for 31 years, about ten cigarettes a day, but is currently not smoking.  He has stopped taking large amounts of ibuprofen and BC powders due to gastrointestinal concerns and is now using Tylenol  for pain management. He is overdue for prostate screening, colonoscopy, and lung cancer screening.      Outpatient Encounter Medications as of 04/29/2024  Medication Sig   atorvastatin  (LIPITOR) 10 MG tablet Take 1 tablet (10 mg total) by mouth daily.   meloxicam (MOBIC) 15 MG tablet Take 1 tablet (15 mg total) by mouth daily.   albuterol  (VENTOLIN  HFA) 108 (90 Base) MCG/ACT inhaler Inhale 2 puffs into the lungs every 6  (six) hours as needed for wheezing or shortness of breath.   amLODipine  (NORVASC ) 10 MG tablet Take 1 tablet (10 mg total) by mouth daily.   famotidine (PEPCID) 20 MG tablet Take 1 tablet (20 mg total) by mouth daily.   hydrALAZINE  (APRESOLINE ) 100 MG tablet Take 1 tablet (100 mg total) by mouth 3 (three) times daily.   hydrOXYzine  (ATARAX ) 25 MG tablet Take 1 tablet (25 mg total) by mouth every 8 (eight) hours as needed for anxiety.   losartan  (COZAAR ) 50 MG tablet Take 1 tablet (50 mg total) by mouth daily.   nicotine  (NICODERM CQ  - DOSED IN MG/24 HOURS) 14 mg/24hr patch Place 1 patch (14 mg total) onto the skin daily.   [EXPIRED] ketorolac  (TORADOL ) injection 60 mg    No facility-administered encounter medications on file as of 04/29/2024.    Past Medical History:  Diagnosis Date   Hypertension     No past surgical history on file.  Family History  Problem Relation Age of Onset   Hypertension Mother    Heart attack Father    Hypertension Father     Social History   Socioeconomic History   Marital status: Widowed    Spouse name: Not on file   Number of children: Not on file   Years of education: Not on file   Highest education level: Not on file  Occupational History   Occupation: Holiday representative  Tobacco Use   Smoking  status: Every Day    Current packs/day: 0.50    Types: Cigarettes   Smokeless tobacco: Current  Vaping Use   Vaping status: Never Used  Substance and Sexual Activity   Alcohol use: Yes    Alcohol/week: 17.0 standard drinks of alcohol    Types: 17 Shots of liquor per week    Comment: 1/5 of liquor daily   Drug use: Not Currently    Types: Cocaine   Sexual activity: Yes    Birth control/protection: Condom  Other Topics Concern   Not on file  Social History Narrative   ** Merged History Encounter **       Social Drivers of Health   Financial Resource Strain: Low Risk  (12/12/2022)   Received from [facility]   Overall Financial Resource Strain  (CARDIA)    Difficulty of Paying Living Expenses: Not hard at all  Food Insecurity: No Food Insecurity (04/22/2024)   Hunger Vital Sign    Worried About Running Out of Food in the Last Year: Never true    Ran Out of Food in the Last Year: Never true  Transportation Needs: Unmet Transportation Needs (04/22/2024)   PRAPARE - Administrator, Civil Service (Medical): No    Lack of Transportation (Non-Medical): Yes  Physical Activity: Not on file  Stress: Not on file  Social Connections: Not on file  Intimate Partner Violence: Not At Risk (04/22/2024)   Humiliation, Afraid, Rape, and Kick questionnaire    Fear of Current or Ex-Partner: No    Emotionally Abused: No    Physically Abused: No    Sexually Abused: No    ROS      Objective    BP 137/89 (BP Location: Left Arm, Patient Position: Sitting)   Pulse 90   Ht 5' 8 (1.727 m)   Wt 288 lb (130.6 kg)   SpO2 97%   BMI 43.79 kg/m   Physical Exam Vitals and nursing note reviewed.    GENERAL: Alert, cooperative, well developed, no acute distress. HEENT: Normocephalic, normal oropharynx, moist mucous membranes. CHEST: Clear to auscultation bilaterally, no wheezes, rhonchi, or crackles. CARDIOVASCULAR: Normal heart rate and rhythm, S1 and S2 normal without murmurs. EXTREMITIES: No cyanosis or edema. NEUROLOGICAL: Cranial nerves grossly intact, moves all extremities without gross motor or sensory deficit.    Assessment & Plan:   Problem List Items Addressed This Visit       Cardiovascular and Mediastinum   Essential hypertension   Relevant Medications   atorvastatin  (LIPITOR) 10 MG tablet     Other   Alcohol use disorder   Other Visit Diagnoses       Prediabetes    -  Primary     Mixed hyperlipidemia       Relevant Medications   atorvastatin  (LIPITOR) 10 MG tablet     Chronic left hip pain       Relevant Medications   meloxicam (MOBIC) 15 MG tablet   ketorolac  (TORADOL ) injection 60 mg (Completed)    Other Relevant Orders   Ambulatory referral to Orthopedic Surgery     Chronic pain of left knee       Relevant Medications   meloxicam (MOBIC) 15 MG tablet   ketorolac  (TORADOL ) injection 60 mg (Completed)   Other Relevant Orders   Ambulatory referral to Orthopedic Surgery     Screening PSA (prostate specific antigen)       Relevant Orders   PSA (Completed)     Encounter for HCV  screening test for low risk patient       Relevant Orders   HCV Ab w Reflex to Quant PCR (Completed)     Assessment and Plan  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 Current ibuprofen use is inadequate and poses gastrointestinal risks. - Prescribe meloxicam 15 mg once daily. - Administer Ketorolac  injection for immediate relief. - Refer to orthopedics for evaluation and management. - Provide Ace bandage wrap for support.  Hypertension Hypertension improving with medication and lifestyle changes. - Monitor blood pressure daily. - Follow up in two weeks to assess control and adjust medications.  Hyperlipidemia Elevated triglycerides and LDL, high cardiovascular risk due to multiple factors. - Prescribe atorvastatin  10 mg daily. - Recheck lipid levels in 3 to 6 months.  Prediabetes Slightly elevated A1c, no current medication. - Recheck A1c in 2.5 months. - Advise avoiding sugary drinks and maintaining a healthy diet.  Prostate cancer screening Screening overdue. - Order PSA blood test.  General Health Maintenance Overdue screenings for colorectal and lung cancer. - Order hepatitis C screening. - Discuss future colonoscopy and lung cancer screening. - Encourage smoking cessation and healthy lifestyle.  - Alcohol abuse Currently in substance abuse treatment program   I have reviewed the patient's medical history (PMH, PSH, Social History, Family History, Medications, and allergies) , and have been  updated if relevant. I spent 40 minutes reviewing chart and  face to face time with patient.     Return in about 2 weeks (around 05/13/2024) for With MMU.   Kirk RAMAN Mayers, PA-C

## 2024-04-30 ENCOUNTER — Encounter: Payer: Self-pay | Admitting: Physician Assistant

## 2024-04-30 LAB — PSA: Prostate Specific Ag, Serum: 2.1 ng/mL (ref 0.0–4.0)

## 2024-04-30 LAB — HCV INTERPRETATION

## 2024-04-30 LAB — HCV AB W REFLEX TO QUANT PCR: HCV Ab: NONREACTIVE

## 2024-05-02 ENCOUNTER — Ambulatory Visit: Payer: Self-pay | Admitting: Physician Assistant

## 2024-05-13 ENCOUNTER — Ambulatory Visit: Payer: MEDICAID | Admitting: Physician Assistant

## 2024-05-13 ENCOUNTER — Telehealth: Payer: Self-pay

## 2024-05-13 ENCOUNTER — Encounter: Payer: Self-pay | Admitting: Physician Assistant

## 2024-05-13 VITALS — BP 144/79 | HR 78 | Wt 252.0 lb

## 2024-05-13 DIAGNOSIS — M25552 Pain in left hip: Secondary | ICD-10-CM

## 2024-05-13 DIAGNOSIS — E782 Mixed hyperlipidemia: Secondary | ICD-10-CM | POA: Diagnosis not present

## 2024-05-13 DIAGNOSIS — G8929 Other chronic pain: Secondary | ICD-10-CM

## 2024-05-13 DIAGNOSIS — R600 Localized edema: Secondary | ICD-10-CM

## 2024-05-13 DIAGNOSIS — M25562 Pain in left knee: Secondary | ICD-10-CM | POA: Diagnosis not present

## 2024-05-13 DIAGNOSIS — I10 Essential (primary) hypertension: Secondary | ICD-10-CM

## 2024-05-13 DIAGNOSIS — F1010 Alcohol abuse, uncomplicated: Secondary | ICD-10-CM

## 2024-05-13 MED ORDER — AMLODIPINE BESYLATE 5 MG PO TABS: 5.0000 mg | ORAL_TABLET | Freq: Every day | ORAL | 0 refills | Status: DC

## 2024-05-13 MED ORDER — HYDROCHLOROTHIAZIDE 25 MG PO TABS: 25.0000 mg | ORAL_TABLET | Freq: Every day | ORAL | 1 refills | Status: DC

## 2024-05-13 MED ORDER — GABAPENTIN 100 MG PO CAPS: 100.0000 mg | ORAL_CAPSULE | Freq: Three times a day (TID) | ORAL | 1 refills | Status: DC

## 2024-05-13 MED ORDER — METHYLPREDNISOLONE ACETATE 80 MG/ML IJ SUSP
80.0000 mg | Freq: Once | INTRAMUSCULAR | Status: AC
  Administered 2024-05-13: 80 mg via INTRAMUSCULAR

## 2024-05-13 NOTE — Progress Notes (Unsigned)
 Established Patient Office Visit  Subjective   Patient ID: Justin Moore, male    DOB: Mar 17, 1966  Age: 58 y.o. MRN: [id number]  Chief Complaint  Patient presents with   Medical Management of Chronic Issues    Leg swelling   Pain hip  Discussed the use of AI scribe software for clinical note transcription with the patient, who gave verbal consent to proceed.  History of Present Illness   Justin Moore is a 58 year old male with chronic left hip and knee pain who presents for follow-up of pain management.  He continues to be treated for substance abuse at [facility] recovery center.  He continues to experience chronic pain in his left hip and knee, which is not adequately managed with his current medication regimen. He previously took Tylenol  twice daily without sufficient relief. Meloxicam and an injection of ketorolac  provided temporary relief for 24 to 30 hours, but meloxicam alone is ineffective.   He has swelling in his legs and feet for the past seven to nine days, which began after restarting amlodipine . He has stopped adding salt to his food. His blood pressure readings have been elevated, with recent measurements of 161/98 and 151/91, except for one low reading of 104/74.  He does believe the low reading was inaccurate  He reports numbness in three toes on his right foot for almost four years.  Gabapentin was effective for pain management in the past.    Past Medical History:  Diagnosis Date   Hypertension    Social History   Socioeconomic History   Marital status: Widowed    Spouse name: Not on file   Number of children: Not on file   Years of education: Not on file   Highest education level: Not on file  Occupational History   Occupation: Holiday representative  Tobacco Use   Smoking status: Every Day    Current packs/day: 0.50    Types: Cigarettes   Smokeless tobacco: Current  Vaping Use   Vaping status: Never Used  Substance and Sexual Activity   Alcohol use: Yes     Alcohol/week: 17.0 standard drinks of alcohol    Types: 17 Shots of liquor per week    Comment: 1/5 of liquor daily   Drug use: Not Currently    Types: Cocaine   Sexual activity: Yes    Birth control/protection: Condom  Other Topics Concern   Not on file  Social History Narrative   ** Merged History Encounter **       Social Drivers of Health   Financial Resource Strain: Low Risk  (12/12/2022)   Received from [facility]   Overall Financial Resource Strain (CARDIA)    Difficulty of Paying Living Expenses: Not hard at all  Food Insecurity: No Food Insecurity (04/22/2024)   Hunger Vital Sign    Worried About Running Out of Food in the Last Year: Never true    Ran Out of Food in the Last Year: Never true  Transportation Needs: Unmet Transportation Needs (04/22/2024)   PRAPARE - Administrator, Civil Service (Medical): No    Lack of Transportation (Non-Medical): Yes  Physical Activity: Not on file  Stress: Not on file  Social Connections: Not on file  Intimate Partner Violence: Not At Risk (04/22/2024)   Humiliation, Afraid, Rape, and Kick questionnaire    Fear of Current or Ex-Partner: No    Emotionally Abused: No    Physically Abused: No    Sexually Abused: No  Family History  Problem Relation Age of Onset   Hypertension Mother    Heart attack Father    Hypertension Father    No Known Allergies  Review of Systems  Constitutional:  Negative for chills and fever.  HENT: Negative.    Eyes: Negative.   Respiratory:  Negative for shortness of breath.   Cardiovascular:  Positive for leg swelling. Negative for chest pain.  Gastrointestinal: Negative.   Genitourinary: Negative.   Musculoskeletal:  Positive for joint pain and myalgias.  Skin: Negative.   Neurological: Negative.   Endo/Heme/Allergies: Negative.   Psychiatric/Behavioral: Negative.        Objective:     BP (!) 144/79 (BP Location: Left Arm, Patient Position: Sitting, Cuff Size: Normal)    Pulse 78   Wt 252 lb (114.3 kg)   SpO2 96%   BMI 38.32 kg/m  BP Readings from Last 3 Encounters:  05/13/24 (!) 144/79  04/29/24 137/89  10/31/22 (!) 159/109   Wt Readings from Last 3 Encounters:  05/13/24 252 lb (114.3 kg)  04/29/24 288 lb (130.6 kg)  10/31/22 239 lb 13.8 oz (108.8 kg)    Physical Exam Vitals and nursing note reviewed.  Constitutional:      Appearance: Normal appearance.  HENT:     Head: Normocephalic and atraumatic.     Right Ear: External ear normal.     Left Ear: External ear normal.     Nose: Nose normal.     Mouth/Throat:     Mouth: Mucous membranes are moist.     Pharynx: Oropharynx is clear.  Eyes:     Extraocular Movements: Extraocular movements intact.     Conjunctiva/sclera: Conjunctivae normal.     Pupils: Pupils are equal, round, and reactive to light.  Cardiovascular:     Rate and Rhythm: Normal rate and regular rhythm.     Pulses: Normal pulses.          Dorsalis pedis pulses are 2+ on the right side and 2+ on the left side.       Posterior tibial pulses are 2+ on the right side and 2+ on the left side.     Heart sounds: Normal heart sounds.  Pulmonary:     Effort: Pulmonary effort is normal.     Breath sounds: Normal breath sounds.  Musculoskeletal:     Cervical back: Normal range of motion and neck supple.     Right lower leg: 1+ Edema present.     Left lower leg: 1+ Edema present.  Skin:    General: Skin is warm and dry.  Neurological:     General: No focal deficit present.     Mental Status: He is alert and oriented to person, place, and time.  Psychiatric:        Mood and Affect: Mood normal.        Behavior: Behavior normal.        Thought Content: Thought content normal.        Judgment: Judgment normal.         Assessment & Plan:   Problem List Items Addressed This Visit       Cardiovascular and Mediastinum   Essential hypertension - Primary   Relevant Medications   amLODipine  (NORVASC ) 5 MG tablet    hydrochlorothiazide (HYDRODIURIL) 25 MG tablet   Other Visit Diagnoses       Mixed hyperlipidemia       Relevant Medications   amLODipine  (NORVASC ) 5 MG tablet   hydrochlorothiazide (  HYDRODIURIL) 25 MG tablet     Chronic left hip pain       Relevant Medications   methylPREDNISolone  acetate (DEPO-MEDROL ) injection 80 mg (Completed)   gabapentin (NEURONTIN) 100 MG capsule   Other Relevant Orders   Ambulatory referral to Orthopedic Surgery     Chronic pain of left knee       Relevant Medications   methylPREDNISolone  acetate (DEPO-MEDROL ) injection 80 mg (Completed)   gabapentin (NEURONTIN) 100 MG capsule   Other Relevant Orders   Ambulatory referral to Orthopedic Surgery     Localized edema         Alcohol abuse         1. Essential hypertension (Primary) Decrease amlodipine  to 5 mg.  Add hydrochlorothiazide.  Patient encouraged to continue checking blood pressure on a daily basis, patient education given on supportive care.  Red flags given for prompt reevaluation.  Follow-up with the mobile unit in 2 weeks. - amLODipine  (NORVASC ) 5 MG tablet; Take 1 tablet (5 mg total) by mouth daily.  Dispense: 30 tablet; Refill: 0 - hydrochlorothiazide (HYDRODIURIL) 25 MG tablet; Take 1 tablet (25 mg total) by mouth daily.  Dispense: 30 tablet; Refill: 1  2. Mixed hyperlipidemia Continue current regimen  3. Chronic left hip pain Trial gabapentin, referred to orthopedics for further evaluation - Ambulatory referral to Orthopedic Surgery - methylPREDNISolone  acetate (DEPO-MEDROL ) injection 80 mg - gabapentin (NEURONTIN) 100 MG capsule; Take 1 capsule (100 mg total) by mouth 3 (three) times daily.  Dispense: 90 capsule; Refill: 1  4. Chronic pain of left knee  - Ambulatory referral to Orthopedic Surgery - methylPREDNISolone  acetate (DEPO-MEDROL ) injection 80 mg - gabapentin (NEURONTIN) 100 MG capsule; Take 1 capsule (100 mg total) by mouth 3 (three) times daily.  Dispense: 90 capsule; Refill:  1  5. Localized edema Patient encouraged to keep feet elevated when possible, reduce sodium intake.  Added hydrochlorothiazide and reduced amlodipine   6. Alcohol abuse Currently in substance abuse treatment program   I have reviewed the patient's medical history (PMH, PSH, Social History, Family History, Medications, and allergies) , and have been updated if relevant. I spent 30 minutes reviewing chart and  face to face time with patient.     Return in about 2 weeks (around 05/27/2024) for With MMU.    Kirk RAMAN Mayers, PA-C

## 2024-05-13 NOTE — Patient Instructions (Signed)
 VISIT SUMMARY:  During today's visit, we discussed your ongoing issues with chronic pain in your left hip and knee, numbness in your right foot, elevated blood pressure, and swelling in your legs and feet. We also reviewed your current medications and made some adjustments to better manage your symptoms.  YOUR PLAN:  -CHRONIC LEFT HIP AND KNEE PAIN: Chronic pain in your left hip and knee continues despite your current medication. We will administer a steroid injection for pain relief, continue meloxicam as previously prescribed, and start you on gabapentin 100 mg three times a day. You will also be referred to orthopedics for further evaluation and management.  -RIGHT FOOT NUMBNESS, POSSIBLE NEUROPATHY: The numbness in your right foot may be due to neuropathy, possibly related to alcohol use. We will start you on gabapentin 100 mg three times a day to help alleviate the symptoms.  -HYPERTENSION WITH MEDICATION-INDUCED LOWER EXTREMITY EDEMA: Your blood pressure has been elevated, and the swelling in your legs and feet is likely due to your current medication, amlodipine . We will reduce your amlodipine  dose to 5 mg and start you on hydrochlorothiazide to help control your blood pressure and reduce the swelling.  -HYPERLIPIDEMIA: You have high cholesterol, and we have previously started you on medication to manage this. No new lab results were discussed today.

## 2024-05-13 NOTE — Telephone Encounter (Unsigned)
 Copied from CRM #[id number]. Topic: Appointments - Transfer of Care >> May 13, 2024  3:25 PM Mia F wrote: Pt is requesting to transfer FROM: Jama Laymon Inches, MD   Pt is requesting to transfer TO: Lynwood Adina Crandall Reason for requested transfer: Need a pcp It is the responsibility of the team the patient would like to transfer to (Dr. Lynwood Crandall) to reach out to the patient if for any reason this transfer is not acceptable.

## 2024-05-21 ENCOUNTER — Encounter: Payer: Self-pay | Admitting: Physician Assistant

## 2024-05-21 ENCOUNTER — Other Ambulatory Visit: Payer: Self-pay

## 2024-05-21 ENCOUNTER — Ambulatory Visit: Payer: Self-pay

## 2024-05-21 ENCOUNTER — Other Ambulatory Visit (INDEPENDENT_AMBULATORY_CARE_PROVIDER_SITE_OTHER): Payer: MEDICAID

## 2024-05-21 ENCOUNTER — Other Ambulatory Visit: Payer: MEDICAID

## 2024-05-21 ENCOUNTER — Other Ambulatory Visit: Payer: Self-pay | Admitting: Physician Assistant

## 2024-05-21 ENCOUNTER — Ambulatory Visit: Payer: MEDICAID | Admitting: Physician Assistant

## 2024-05-21 VITALS — Ht 68.0 in | Wt 250.0 lb

## 2024-05-21 DIAGNOSIS — M25552 Pain in left hip: Secondary | ICD-10-CM

## 2024-05-21 DIAGNOSIS — M25562 Pain in left knee: Secondary | ICD-10-CM

## 2024-05-21 DIAGNOSIS — G8929 Other chronic pain: Secondary | ICD-10-CM

## 2024-05-21 NOTE — Progress Notes (Signed)
 Office Visit Note   Patient: Justin Moore           Date of Birth: December 24, 1965           MRN: [id number] Visit Date: 05/21/2024              Requested by: Mayers, Kirk RAMAN, PA-C [address] [place],  [place] [address] PCP: Pcp, No   Assessment & Plan: Visit Diagnoses:  1. Chronic pain of left knee   2. Pain of left hip     Plan: Patient is a pleasant 58 year old gentleman who comes in for 2 reasons.  First he has a 1 year history of increasing left hip pain.  Denies any injuries.  Second he comes in for more chronic history of left knee pain secondary to a remote football injury.  X-rays today demonstrate degenerative changes arthritis of the left knee.  More severely he has findings consistent with severe degeneration of the left hip.  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  He is currently in recovery and has a long-term commitment to his sobriety and is staying at a this rehab facility.  He has taken 8 months off from work for his recovery.  I did review the x-rays and exam with Dr. Jerri.  I will go forward with a steroid injection into the left knee today and authorize for viscosupplementation.  With regards to the hip I do not think injections would help him.  He is interested in having a left hip replacement.  His dad had 1 when he was in his 73s.  I reviewed the risks and basic recovery from his surgery.  He was given a medical clearance form that he will take to primary care.  He has a very supportive family including his sister who has access to many nursing facilities in the area and would help facilitate making sure that she is properly taken care of after surgery.  He smokes occasionally we talked about refraining from that.  He does have a BMI of 38.16.  This is focused in his abdomen.  I talked to him seriously about trying to continue to lose weight and focus his exercise program on core to hopefully decrease his weight.  Once  he has medical clearance he will follow-up with Dr. Jerri.  Surgery could be scheduled.  Will go forward with a knee injection today and authorize for gel injections This patient is diagnosed with osteoarthritis of the knee(s).    Radiographs show evidence of joint space narrowing, osteophytes, subchondral sclerosis and/or subchondral cysts.  This patient has knee pain which interferes with functional and activities of daily living.    This patient has experienced inadequate response, adverse effects and/or intolerance with conservative treatments such as acetaminophen , NSAIDS, topical creams, physical therapy or regular exercise, knee bracing and/or weight loss.   This patient has experienced inadequate response or has a contraindication to intra articular steroid injections for at least 3 months.   This patient is not scheduled to have a total knee replacement within 6 months of starting treatment with viscosupplementation.  Follow-Up Instructions: With Dr. Jerri  Orders:  Orders Placed This Encounter  Procedures   XR HIP UNILAT W OR W/O PELVIS 1V LEFT   XR Knee 1-2 Views Left   No orders of the defined types were placed in this encounter.     Procedures: No procedures performed   Clinical Data: No  additional findings.   Subjective: Chief Complaint  Patient presents with   Left Knee - Pain   Left Hip - Pain    HPI Pleasant 58 year old gentleman currently comes in today with a chronic history of left knee pain.  This was a result of a football injury many years ago.  Also complaining of a 1+ year history but no more than 59-month history of left hip pain.  He is currently recovering alcoholic in a inpatient program.  He has been doing very well.  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    Objective: Vital Signs: Ht 5' 8 (1.727 m)   Wt 250 lb (113.4 kg)   BMI 38.01 kg/m    Physical Exam Constitutional:      Appearance: Normal appearance.  Pulmonary:     Effort: Pulmonary effort is normal.  Skin:    General: Skin is warm and dry.  Neurological:     General: No focal deficit present.     Mental Status: He is alert and oriented to person, place, and time.  Psychiatric:        Mood and Affect: Mood normal.        Behavior: Behavior normal.     Ortho Exam Examination of the left knee no effusion no erythema compartments are soft and compressible he has good extension flexion he has grinding with range of motion.  Neurovascular intact. Examination of the left hip he has limited movement and pain especially with external/internal rotation.  Quad strength is intact.  Distally neurovascular intact no radicular findings Specialty Comments:  No specialty comments available.  Imaging: XR Knee 1-2 Views Left Result Date: 05/21/2024 Radiographs of the left knee demonstrate moderate degenerative changes with joint space narrowing and sclerotic changes and periarticular osteophytes  XR HIP UNILAT W OR W/O PELVIS 1V LEFT Result Date: 05/21/2024 X-rays of his left hip pelvis demonstrate advanced degenerative changes with cystic changes.  Films are obliterated by body habitus however findings could be constant consistent with an AVN also some degenerative changes in the right hip    PMFS History: Patient Active Problem List   Diagnosis Date Noted   Alcohol withdrawal delirium, acute, hyperactive (HCC) 12/26/2021   Thrombocytopenia 12/26/2021   Hypokalemia 12/26/2021   Hypertensive emergency 12/26/2021   Alcohol withdrawal (HCC) 12/26/2021   Alcoholic hepatitis without ascites (HCC) 05/20/2021   Alcohol use disorder 05/20/2021   Smoking 12/05/2019   Elevated lipids 12/05/2019   Leukocytosis 12/05/2019   Encounter to establish care 11/28/2019   Essential hypertension 11/28/2019   Past Medical History:  Diagnosis Date   Hypertension     Family  History  Problem Relation Age of Onset   Hypertension Mother    Heart attack Father    Hypertension Father     No past surgical history on file. Social History   Occupational History   Occupation: Holiday Representative  Tobacco Use   Smoking status: Every Day    Current packs/day: 0.50    Types: Cigarettes   Smokeless tobacco: Current  Vaping Use   Vaping status: Never Used  Substance and Sexual Activity   Alcohol use: Yes    Alcohol/week: 17.0 standard drinks of alcohol    Types: 17 Shots of liquor per week    Comment: 1/5 of liquor daily   Drug use: Not Currently    Types: Cocaine   Sexual activity: Yes  Birth control/protection: Condom

## 2024-05-21 NOTE — Telephone Encounter (Signed)
  Reason for Disposition  Requesting regular office appointment  Answer Assessment - Initial Assessment Questions Patient needs surgical clearance left knee and hip surgery. Is followed by [facility]. Does not have a PCP.   Most recently seen by ortho today, does not require visit with PCP to address pain. Scheduled OV to established care.  Patient was advised that signing clearance paperwork may not happen at new pt visit as that is typically only to establish care and review medical hx. Pt verbalized understanding.  Protocols used: Information Only Call - No Triage-A-AH Copied from CRM [id number]. 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 Patient requesting to establish care at the [facility] location to have a  release form completed(surgical clearance)

## 2024-05-24 ENCOUNTER — Telehealth: Payer: Self-pay | Admitting: Orthopaedic Surgery

## 2024-05-24 ENCOUNTER — Ambulatory Visit: Payer: MEDICAID

## 2024-05-24 VITALS — BP 168/91 | HR 81 | Temp 98.1°F | Resp 16 | Ht 68.0 in | Wt 252.0 lb

## 2024-05-24 DIAGNOSIS — I10 Essential (primary) hypertension: Secondary | ICD-10-CM | POA: Diagnosis not present

## 2024-05-24 DIAGNOSIS — M25552 Pain in left hip: Secondary | ICD-10-CM | POA: Diagnosis not present

## 2024-05-24 DIAGNOSIS — Z7689 Persons encountering health services in other specified circumstances: Secondary | ICD-10-CM

## 2024-05-24 DIAGNOSIS — E782 Mixed hyperlipidemia: Secondary | ICD-10-CM | POA: Diagnosis not present

## 2024-05-24 DIAGNOSIS — Z23 Encounter for immunization: Secondary | ICD-10-CM | POA: Diagnosis not present

## 2024-05-24 MED ORDER — ATORVASTATIN CALCIUM 10 MG PO TABS: 10.0000 mg | ORAL_TABLET | Freq: Every day | ORAL | 2 refills | Status: AC

## 2024-05-24 MED ORDER — AMLODIPINE BESYLATE 10 MG PO TABS: 10.0000 mg | ORAL_TABLET | Freq: Every day | ORAL | 2 refills | Status: DC

## 2024-05-24 MED ORDER — GABAPENTIN 300 MG PO CAPS: 300.0000 mg | ORAL_CAPSULE | Freq: Three times a day (TID) | ORAL | 3 refills | Status: DC

## 2024-05-24 MED ORDER — HYDROCHLOROTHIAZIDE 50 MG PO TABS: 50.0000 mg | ORAL_TABLET | Freq: Every day | ORAL | 0 refills | Status: DC

## 2024-05-24 MED ORDER — LOSARTAN POTASSIUM 50 MG PO TABS: 50.0000 mg | ORAL_TABLET | Freq: Every day | ORAL | 2 refills | Status: DC

## 2024-05-24 NOTE — Telephone Encounter (Signed)
 Pt called wanting to know if we haver eceived a fax form his primary physcian concerning his le

## 2024-05-24 NOTE — Progress Notes (Signed)
     Patient ID: Justin Moore, male    DOB: Aug 23, 1965  MRN: [id number]  CC: Establish Care   Subjective: Justin Moore is a 58 y.o. male with past medical history of hypertension and osteoarthritis who presents to clinic to establish care. No acute concerns. Pt seeking medical clearance for hip replacement surgery.   No Known Allergies  ROS: Review of Systems Negative except as stated above  PHYSICAL EXAM: BP (!) 168/91   Pulse 81   Temp 98.1 F (36.7 C) (Oral)   Resp 16   Ht 5' 8 (1.727 m)   Wt 252 lb (114.3 kg)   SpO2 93%   BMI 38.32 kg/m   Physical Exam  General: well-appearing, no acute distress Skin: no jaundice, rashes, or lesions Cardiovascular: regular heart rate and rhythm, normal S1/S2, no murmurs, gallops, or rubs, peripheral pulses 2+ bilaterally Chest: no skeletal deformity, lungs clear to auscultation bilaterally, equal breath sounds bilaterally Musculoskeletal: normal gait Extremities: no peripheral edema  ASSESSMENT AND PLAN:  1. Encounter to establish care with new provider (Primary)  2. Essential hypertension - BP 168/91, not at goal. Increasing Amlodipine  to 10mg  daily and HcTZ to 50mg .  - Recommended compression stockings to decrease BLE swelling as well as elevating legs.  - amLODipine  (NORVASC ) 10 MG tablet; Take 1 tablet (10 mg total) by mouth daily.  Dispense: 90 tablet; Refill: 2 - hydrochlorothiazide (HYDRODIURIL) 50 MG tablet; Take 1 tablet (50 mg total) by mouth daily.  Dispense: 90 tablet; Refill: 0 - losartan  (COZAAR ) 50 MG tablet; Take 1 tablet (50 mg total) by mouth daily.  Dispense: 90 tablet; Refill: 2  3. Mixed hyperlipidemia - atorvastatin  (LIPITOR) 10 MG tablet; Take 1 tablet (10 mg total) by mouth daily.  Dispense: 30 tablet; Refill: 2  4. Encounter for immunization - Flu vaccine trivalent PF, 6mos and older(Flulaval,Afluria,Fluarix,Fluzone)  5. Pain of left hip - gabapentin (NEURONTIN) 300 MG capsule; Take 1 capsule  (300 mg total) by mouth 3 (three) times daily.  Dispense: 90 capsule; Refill: 3    Patient was given the opportunity to ask questions.  Patient verbalized understanding of the plan and was able to repeat key elements of the plan.    Orders Placed This Encounter  Procedures   Flu vaccine trivalent PF, 6mos and older(Flulaval,Afluria,Fluarix,Fluzone)     Requested Prescriptions   Signed Prescriptions Disp Refills   amLODipine  (NORVASC ) 10 MG tablet 90 tablet 2    Sig: Take 1 tablet (10 mg total) by mouth daily.   atorvastatin  (LIPITOR) 10 MG tablet 30 tablet 2    Sig: Take 1 tablet (10 mg total) by mouth daily.   hydrochlorothiazide (HYDRODIURIL) 50 MG tablet 90 tablet 0    Sig: Take 1 tablet (50 mg total) by mouth daily.   gabapentin (NEURONTIN) 300 MG capsule 90 capsule 3    Sig: Take 1 capsule (300 mg total) by mouth 3 (three) times daily.   losartan  (COZAAR ) 50 MG tablet 90 tablet 2    Sig: Take 1 tablet (50 mg total) by mouth daily.    Return in about 1 month (around 06/23/2024) for follow-up blood pressure.  Sula Leavy Rode, PA-C

## 2024-05-27 DIAGNOSIS — M25552 Pain in left hip: Secondary | ICD-10-CM | POA: Insufficient documentation

## 2024-05-28 ENCOUNTER — Telehealth: Payer: Self-pay | Admitting: Orthopaedic Surgery

## 2024-05-28 NOTE — Telephone Encounter (Signed)
 Patient's sister (Jerilyn) called to schedule left total hip.  07-08-24 is being held since patient has been cleared.  Please provide surgery order.

## 2024-05-28 NOTE — Telephone Encounter (Signed)
 It appears that I haven't actually met him yet.  He saw Justin Moore previously.  He'll need to make an appointment to meet me first.  We can hold the surgery date.  He has a very large abdomen and I need to evaluate that area to make sure that surgery is feasible.

## 2024-06-11 ENCOUNTER — Ambulatory Visit (INDEPENDENT_AMBULATORY_CARE_PROVIDER_SITE_OTHER): Payer: MEDICAID | Admitting: Orthopaedic Surgery

## 2024-06-11 DIAGNOSIS — M1612 Unilateral primary osteoarthritis, left hip: Secondary | ICD-10-CM | POA: Insufficient documentation

## 2024-06-11 MED ORDER — TRAMADOL HCL 50 MG PO TABS: 50.0000 mg | ORAL_TABLET | Freq: Every day | ORAL | 0 refills | Status: DC | PRN

## 2024-06-11 NOTE — Progress Notes (Signed)
 Office Visit Note   Patient: Justin Moore           Date of Birth: [birth year]           MRN: [id number] Visit Date: 06/11/2024              Requested by: [name], PA-C [address] [place],  [place] [address] PCP: Leavy Lucas, Fox, PA-C   Assessment & Plan: Visit Diagnoses:  1. Primary osteoarthritis of left hip     Plan: History of Present Illness Justin Moore is a 58 year old male who presents for evaluation and discussion of left hip surgery. He is accompanied by his sister. He has previously consulted with a healthcare provider.  He experiences persistent hip pain unrelieved by gabapentin and seeks stronger pain management. He has not received cortisone injections in the hip joint.  He has a history of alcohol use, was recently in the ER for alcohol-related issues, and is now in a treatment program. He has been sober for approximately seventy days.  He smokes half a pack of cigarettes daily. He is currently unemployed and lives with his mother and sister, who support him.  Left hip exam shows severe pain and limitation with ROM and manipulation of the joint.  Assessment and Plan Left hip osteoarthritis Chronic osteoarthritis. Previous treatments ineffective. Total hip replacement scheduled for December 12th. Discussed risks, including alcohol-related complications, and emphasized abstinence and compliance with rehabilitation. - Proceed with total hip replacement surgery on December 12th. - Obtain medical clearance from primary care physician. - Prescribe tramadol  for pain management. - Arrange post-operative home care with family support. - Schedule follow-up appointments to monitor recovery.  Impression is severe left hip degenerative joint disease secondary to Osteoarthritis.  Patient has attempted conservative treatment for at least 6 consecutive weeks within the past 12 weeks, including but not limited to physical therapy, home exercise  program, NSAIDs, activity modification, and/or corticosteroid injections. Despite these efforts, symptoms have not improved or have worsened. Conservative measures have been deemed unsuccessful at this time. After a detailed discussion covering diagnosis and treatment options--including the risks, benefits, alternatives, and potential complications of surgical and nonsurgical management--the patient elected to proceed with surgery.  Current anticoagulants: No antithrombotic Postop anticoagulation: Aspirin 81 mg Diabetic: No  Prior DVT/PE: No Tobacco use: Yes Clearances needed for surgery: PCP Anticipate discharge dispo: home   Follow-Up Instructions: No follow-ups on file.   Orders:  No orders of the defined types were placed in this encounter.  Meds ordered this encounter  Medications   traMADol  (ULTRAM ) 50 MG tablet    Sig: Take 1-2 tablets (50-100 mg total) by mouth daily as needed.    Dispense:  20 tablet    Refill:  0      Procedures: No procedures performed   Clinical Data: No additional findings.   Subjective: Chief Complaint  Patient presents with   Left Hip - Pain    HPI  Review of Systems  Constitutional: Negative.   HENT: Negative.    Eyes: Negative.   Respiratory: Negative.    Cardiovascular: Negative.   Gastrointestinal: Negative.   Endocrine: Negative.   Genitourinary: Negative.   Skin: Negative.   Allergic/Immunologic: Negative.   Neurological: Negative.   Hematological: Negative.   Psychiatric/Behavioral: Negative.    All other systems reviewed and are negative.    Objective: Vital Signs: There were no vitals taken for this visit.  Physical Exam Vitals and nursing note  reviewed.  Constitutional:      Appearance: He is well-developed.  HENT:     Head: Normocephalic and atraumatic.  Eyes:     Pupils: Pupils are equal, round, and reactive to light.  Pulmonary:     Effort: Pulmonary effort is normal.  Abdominal:     Palpations:  Abdomen is soft.  Musculoskeletal:        General: Normal range of motion.     Cervical back: Neck supple.  Skin:    General: Skin is warm.  Neurological:     Mental Status: He is alert and oriented to person, place, and time.  Psychiatric:        Behavior: Behavior normal.        Thought Content: Thought content normal.        Judgment: Judgment normal.     Ortho Exam  Specialty Comments:  No specialty comments available.  Imaging: No results found.   PMFS History: Patient Active Problem List   Diagnosis Date Noted   Primary osteoarthritis of left hip 06/11/2024   Pain of left hip 05/27/2024   Mixed hyperlipidemia 05/24/2024   Alcohol withdrawal delirium, acute, hyperactive (HCC) 12/26/2021   Thrombocytopenia 12/26/2021   Hypokalemia 12/26/2021   Hypertensive emergency 12/26/2021   Alcohol withdrawal (HCC) 12/26/2021   Alcoholic hepatitis without ascites (HCC) 05/20/2021   Alcohol use disorder 05/20/2021   Smoking 12/05/2019   Elevated lipids 12/05/2019   Leukocytosis 12/05/2019   Encounter to establish care 11/28/2019   Essential hypertension 11/28/2019   Past Medical History:  Diagnosis Date   Hypertension     Family History  Problem Relation Age of Onset   Hypertension Mother    Heart attack Father    Hypertension Father     No past surgical history on file. Social History   Occupational History   Occupation: Holiday Representative  Tobacco Use   Smoking status: Every Day    Current packs/day: 0.50    Types: Cigarettes   Smokeless tobacco: Current  Vaping Use   Vaping status: Never Used  Substance and Sexual Activity   Alcohol use: Yes    Alcohol/week: 17.0 standard drinks of alcohol    Types: 17 Shots of liquor per week    Comment: 1/5 of liquor daily   Drug use: Not Currently    Types: Cocaine   Sexual activity: Yes    Birth control/protection: Condom

## 2024-06-25 ENCOUNTER — Encounter: Payer: Self-pay | Admitting: Orthopaedic Surgery

## 2024-06-25 ENCOUNTER — Other Ambulatory Visit: Payer: Self-pay | Admitting: Physician Assistant

## 2024-06-25 MED ORDER — TRAMADOL HCL 50 MG PO TABS: 50.0000 mg | ORAL_TABLET | Freq: Every day | ORAL | 0 refills | Status: DC | PRN

## 2024-07-01 ENCOUNTER — Telehealth: Payer: Self-pay

## 2024-07-01 NOTE — Telephone Encounter (Signed)
 Patients sister would like to know what medications patient should not be taken 1 week prior to surgery.  CB#  [phone].  Please advise.  Thank you.

## 2024-07-01 NOTE — Telephone Encounter (Signed)
 Stop nsaids 7 days prior to surgery.  As far as his blood pressure meds and other rxs on file, I would reach out to pre-op as they will inform patient what to not take day of surgery

## 2024-07-02 ENCOUNTER — Ambulatory Visit: Payer: MEDICAID

## 2024-07-02 DIAGNOSIS — I10 Essential (primary) hypertension: Secondary | ICD-10-CM

## 2024-07-02 MED ORDER — HYDROCHLOROTHIAZIDE 50 MG PO TABS: 50.0000 mg | ORAL_TABLET | Freq: Every day | ORAL | 2 refills | Status: AC

## 2024-07-02 MED ORDER — AMLODIPINE BESYLATE 10 MG PO TABS: 10.0000 mg | ORAL_TABLET | Freq: Every day | ORAL | 2 refills | Status: AC

## 2024-07-02 MED ORDER — LOSARTAN POTASSIUM 50 MG PO TABS: 50.0000 mg | ORAL_TABLET | Freq: Every day | ORAL | 2 refills | Status: DC

## 2024-07-02 NOTE — Telephone Encounter (Signed)
Called and informed patients sister 

## 2024-07-02 NOTE — Progress Notes (Unsigned)
     Patient ID: DEWITTE VANNICE, male    DOB: 03/23/1966  MRN: [id number]  CC: Follow-up (Want to start medicine after having surgery. Toe numbness on right foot.)   Subjective: Cowen Pesqueira is a 58 y.o. male with past medical history of *** who presents to clinic for    No Known Allergies  ROS: Review of Systems Negative except as stated above  PHYSICAL EXAM: BP (!) 147/88   Pulse 85   Temp (!) 97.5 F (36.4 C)   Resp 18   Ht 5' 8 (1.727 m)   Wt 251 lb 6.4 oz (114 kg)   SpO2 (!) 85%   BMI 38.23 kg/m   Physical Exam  General: well-appearing, no acute distress Skin: no jaundice, rashes, or lesions Cardiovascular: regular heart rate and rhythm, normal S1/S2, no murmurs, gallops, or rubs, peripheral pulses 2+ bilaterally Chest: no skeletal deformity, lungs clear to auscultation bilaterally, equal breath sounds bilaterally Abdomen: soft, non-distended, non-tender to palpation, no hepatomegaly, no splenomegaly, normoactive bowel sounds Musculoskeletal: normal gait Extremities: no peripheral edema  ASSESSMENT AND PLAN:  There are no diagnoses linked to this encounter.   Patient was given the opportunity to ask questions.  Patient verbalized understanding of the plan and was able to repeat key elements of the plan.    No orders of the defined types were placed in this encounter.    Requested Prescriptions    No prescriptions requested or ordered in this encounter    No follow-ups on file.  Sula Leavy Rode, PA-C

## 2024-07-04 NOTE — Progress Notes (Signed)
 Surgical Instructions   Your procedure is scheduled on Monday, December 15th, 2025. Report to [facility] Main Entrance A at 9:00 A.M., then check in with the Admitting office. Any questions or running late day of surgery: call [phone]  Questions prior to your surgery date: call [phone], Monday-Friday, 8am-4pm. If you experience any cold or flu symptoms such as cough, fever, chills, shortness of breath, etc. between now and your scheduled surgery, please notify us  at the above number.     Remember:  Do not eat after midnight the night before your surgery  You may drink clear liquids until 8:30 the morning of your surgery.   Clear liquids allowed are: Water, Non-Citrus Juices (without pulp), Carbonated Beverages, Clear Tea (no milk, honey, etc.), Black Coffee Only (NO MILK, CREAM OR POWDERED CREAMER of any kind), and Gatorade.  Patient Instructions  The night before surgery:  No food after midnight. ONLY clear liquids after midnight  The day of surgery (if you do NOT have diabetes):  Drink ONE (1) Pre-Surgery Clear Ensure by 8:30 the morning of surgery. Drink in one sitting. Do not sip.  This drink was given to you during your hospital  pre-op appointment visit.  Nothing else to drink after completing the  Pre-Surgery Clear Ensure.          If you have questions, please contact your surgeons office.     Take these medicines the morning of surgery with A SIP OF WATER: Amlodipine  (Norvasc ) Atorvastatin  (Lipitor) Gabapentin  (Neurontin ) Hydralazine  (Apresoline )   May take these medicines IF NEEDED: Albuterol  (Ventolin ) Hydroxyzine  (Atarax ) Tramadol  (Ultram )    One week prior to surgery, STOP taking any Aspirin (unless otherwise instructed by your surgeon) Aleve, Naproxen, Ibuprofen , Motrin , Advil , Goody's, BC's, all herbal medications, fish oil, and non-prescription vitamins.                     Do NOT Smoke (Tobacco/Vaping) for 24 hours prior to your  procedure.  If you use a CPAP at night, you may bring your mask/headgear for your overnight stay.   You will be asked to remove any contacts, glasses, piercing's, hearing aid's, dentures/partials prior to surgery. Please bring cases for these items if needed.    Patients discharged the day of surgery will not be allowed to drive home, and someone needs to stay with them for 24 hours.  SURGICAL WAITING ROOM VISITATION Patients may have no more than 2 support people in the waiting area - these visitors may rotate.   Pre-op nurse will coordinate an appropriate time for 1 ADULT support person, who may not rotate, to accompany patient in pre-op.  Children under the age of 32 must have an adult with them who is not the patient and must remain in the main waiting area with an adult.  If the patient needs to stay at the hospital during part of their recovery, the visitor guidelines for inpatient rooms apply.  Please refer to the [facility] website for the visitor guidelines for any additional information.   If you received a COVID test during your pre-op visit  it is requested that you wear a mask when out in public, stay away from anyone that may not be feeling well and notify your surgeon if you develop symptoms. If you have been in contact with anyone that has tested positive in the last 10 days please notify you surgeon.      Pre-operative 4 CHG Bathing Instructions   You can play  a key role in reducing the risk of infection after surgery. Your skin needs to be as free of germs as possible. You can reduce the number of germs on your skin by washing with CHG (chlorhexidine  gluconate) soap before surgery. CHG is an antiseptic soap that kills germs and continues to kill germs even after washing.   DO NOT use if you have an allergy to chlorhexidine /CHG or antibacterial soaps. If your skin becomes reddened or irritated, stop using the CHG and notify one of our RNs at [phone].   Please  shower with the CHG soap starting 4 days before surgery using the following schedule:     Please keep in mind the following:  DO NOT shave, including legs and underarms, starting the day of your first shower.   You may shave your face at any point before/day of surgery.  Place clean sheets on your bed the day you start using CHG soap. Use a clean washcloth (not used since being washed) for each shower. DO NOT sleep with pets once you start using the CHG.   CHG Shower Instructions:  Wash your face and private area with normal soap. If you choose to wash your hair, wash first with your normal shampoo.  After you use shampoo/soap, rinse your hair and body thoroughly to remove shampoo/soap residue.  Turn the water OFF and apply  bottle of CHG soap to a CLEAN washcloth.  Apply CHG soap ONLY FROM YOUR NECK DOWN TO YOUR TOES (washing for 3-5 minutes)  DO NOT use CHG soap on face, private areas, open wounds, or sores.  Pay special attention to the area where your surgery is being performed.  If you are having back surgery, having someone wash your back for you may be helpful. Wait 2 minutes after CHG soap is applied, then you may rinse off the CHG soap.  Pat dry with a clean towel  Put on clean clothes/pajamas   If you choose to wear lotion, please use ONLY the CHG-compatible lotions that are listed below.  Additional instructions for the day of surgery:  If you choose, you may shower the morning of surgery with an antibacterial soap.  DO NOT APPLY any lotions, deodorants, cologne, or perfumes.   Do not bring valuables to the hospital. [facility] is not responsible for any belongings/valuables. Do not wear nail polish, gel polish, artificial nails, or any other type of covering on natural nails (fingers and toes) Do not wear jewelry or makeup Put on clean/comfortable clothes.  Please brush your teeth.  Ask your nurse before applying any prescription medications to the skin.     CHG  Compatible Lotions   Aveeno Moisturizing lotion  Cetaphil Moisturizing Cream  Cetaphil Moisturizing Lotion  Clairol Herbal Essence Moisturizing Lotion, Dry Skin  Clairol Herbal Essence Moisturizing Lotion, Extra Dry Skin  Clairol Herbal Essence Moisturizing Lotion, Normal Skin  Curel Age Defying Therapeutic Moisturizing Lotion with Alpha Hydroxy  Curel Extreme Care Body Lotion  Curel Soothing Hands Moisturizing Hand Lotion  Curel Therapeutic Moisturizing Cream, Fragrance-Free  Curel Therapeutic Moisturizing Lotion, Fragrance-Free  Curel Therapeutic Moisturizing Lotion, Original Formula  Eucerin Daily Replenishing Lotion  Eucerin Dry Skin Therapy Plus Alpha Hydroxy Crme  Eucerin Dry Skin Therapy Plus Alpha Hydroxy Lotion  Eucerin Original Crme  Eucerin Original Lotion  Eucerin Plus Crme Eucerin Plus Lotion  Eucerin TriLipid Replenishing Lotion  Keri Anti-Bacterial Hand Lotion  Keri Deep Conditioning Original Lotion Dry Skin Formula Softly Scented  Keri Deep Conditioning Original Lotion,  Fragrance Free Sensitive Skin Formula  Keri Lotion Fast Absorbing Fragrance Free Sensitive Skin Formula  Keri Lotion Fast Absorbing Softly Scented Dry Skin Formula  Keri Original Lotion  Keri Skin Renewal Lotion Keri Silky Smooth Lotion  Keri Silky Smooth Sensitive Skin Lotion  Nivea Body Creamy Conditioning Oil  Nivea Body Extra Enriched Lotion  Nivea Body Original Lotion  Nivea Body Sheer Moisturizing Lotion Nivea Crme  Nivea Skin Firming Lotion  NutraDerm 30 Skin Lotion  NutraDerm Skin Lotion  NutraDerm Therapeutic Skin Cream  NutraDerm Therapeutic Skin Lotion  ProShield Protective Hand Cream  Provon moisturizing lotion  Please read over the following fact sheets that you were given.

## 2024-07-05 ENCOUNTER — Other Ambulatory Visit: Payer: Self-pay

## 2024-07-05 ENCOUNTER — Encounter (HOSPITAL_COMMUNITY): Payer: Self-pay

## 2024-07-05 ENCOUNTER — Inpatient Hospital Stay (HOSPITAL_COMMUNITY)
Admission: RE | Admit: 2024-07-05 | Discharge: 2024-07-05 | Payer: MEDICAID | Attending: Orthopaedic Surgery | Admitting: Orthopaedic Surgery

## 2024-07-05 VITALS — BP 123/95 | HR 86 | Temp 98.7°F | Resp 18 | Ht 68.0 in | Wt 251.5 lb

## 2024-07-05 DIAGNOSIS — Z01818 Encounter for other preprocedural examination: Secondary | ICD-10-CM

## 2024-07-05 DIAGNOSIS — M1612 Unilateral primary osteoarthritis, left hip: Secondary | ICD-10-CM

## 2024-07-05 HISTORY — DX: Unspecified osteoarthritis, unspecified site: M19.90

## 2024-07-05 LAB — CBC
HCT: 39.3 % (ref 39.0–52.0)
Hemoglobin: 14.7 g/dL (ref 13.0–17.0)
MCH: 32 pg (ref 26.0–34.0)
MCHC: 37.4 g/dL — ABNORMAL HIGH (ref 30.0–36.0)
MCV: 85.4 fL (ref 80.0–100.0)
Platelets: 269 K/uL (ref 150–400)
RBC: 4.6 MIL/uL (ref 4.22–5.81)
RDW: 12.8 % (ref 11.5–15.5)
WBC: 11.5 K/uL — ABNORMAL HIGH (ref 4.0–10.5)
nRBC: 0 % (ref 0.0–0.2)

## 2024-07-05 LAB — BASIC METABOLIC PANEL WITH GFR
Anion gap: 14 (ref 5–15)
BUN: 25 mg/dL — ABNORMAL HIGH (ref 6–20)
CO2: 22 mmol/L (ref 22–32)
Calcium: 9.6 mg/dL (ref 8.9–10.3)
Chloride: 101 mmol/L (ref 98–111)
Creatinine, Ser: 1.53 mg/dL — ABNORMAL HIGH (ref 0.61–1.24)
GFR, Estimated: 53 mL/min — ABNORMAL LOW (ref >60)
Glucose, Bld: 130 mg/dL — ABNORMAL HIGH (ref 70–99)
Potassium: 3.4 mmol/L — ABNORMAL LOW (ref 3.5–5.1)
Sodium: 137 mmol/L (ref 135–145)

## 2024-07-05 LAB — TYPE AND SCREEN
ABO/RH(D): A POS
Antibody Screen: NEGATIVE

## 2024-07-05 LAB — SURGICAL PCR SCREEN
MRSA, PCR: NEGATIVE
Staphylococcus aureus: NEGATIVE

## 2024-07-05 NOTE — Progress Notes (Addendum)
 PCP - [name], PA-C  Cardiologist -   PPM/ICD - denies Device Orders - n/a Rep Notified - n/a  Chest x-ray - denies EKG - 04-22-24 Stress Test - denies ECHO - denies Cardiac Cath - denies  Sleep Study - denies CPAP - n/a  DM -denies  Blood Thinner Instructions:denies Aspirin Instructions:denies  ERAS Protcol -clear liquids until 8:30 PRE-SURGERY Ensure    COVID TEST-    Anesthesia review: Yes, Hx of HTN and clearance  Patient denies shortness of breath, fever, cough and chest pain at PAT appointment   All instructions explained to the patient, with a verbal understanding of the material. Patient agrees to go over the instructions while at home for a better understanding. Patient also instructed to self quarantine after being tested for COVID-19. The opportunity to ask questions was provided.

## 2024-07-05 NOTE — Progress Notes (Signed)
 Anesthesia Chart Review:  Case: [id number] Date/Time: 07/08/24 1111   Procedure: ARTHROPLASTY, HIP, TOTAL, ANTERIOR APPROACH (Left: Hip) - 3-C   Anesthesia type: Spinal   Diagnosis: Primary osteoarthritis of left hip [M16.12]   Pre-op diagnosis: left hip osteoarthritis   [facility] ROOM [facility] / [facility]   Surgeons: [name], MD       DISCUSSION: Patient is a 58 year old male scheduled for the above procedure.  History includes smoking, HTN, osteoarthritis.  He has medical clearance for surgery per PCP [name], PA-C, classified as low risk but medication adjustments made to improve BP (scanned under Media tab).    Preoperative labs and EKG noted.  Anesthesia team to evaluate on the day of surgery.   VS: BP (!) 123/95   Pulse 86   Temp 37.1 C (Oral)   Resp 18   Ht 5' 8 (1.727 m)   Wt 114.1 kg   SpO2 99%   BMI 38.24 kg/m   PROVIDERS: [name], PA-C is PCP    LABS: Labs reviewed: Acceptable for surgery. Cr 1.52, previously 1.36 on 04/22/2024. A1c 5.9%, TSH 1.758, and LFTs normal on 04/22/2024.  (all labs ordered are listed, but only abnormal results are displayed)  Labs Reviewed  CBC - Abnormal; Notable for the following components:      Result Value   WBC 11.5 (*)    MCHC 37.4 (*)    All other components within normal limits  BASIC METABOLIC PANEL WITH GFR - Abnormal; Notable for the following components:   Potassium 3.4 (*)    Glucose, Bld 130 (*)    BUN 25 (*)    Creatinine, Ser 1.53 (*)    GFR, Estimated 53 (*)    All other components within normal limits  SURGICAL PCR SCREEN  TYPE AND SCREEN     IMAGES: Xray left hip 05/21/2024: X-rays of his left hip pelvis demonstrate advanced degenerative changes with cystic changes.  Films are obliterated by body habitus however findings could be constant consistent with an AVN also some degenerative changes in the right hip     EKG: 04/22/2024: NSR   CV: N/A  Past Medical History:   Diagnosis Date   Arthritis    Hypertension     History reviewed. No pertinent surgical history.  MEDICATIONS:  albuterol  (VENTOLIN  HFA) 108 (90 Base) MCG/ACT inhaler   amLODipine  (NORVASC ) 10 MG tablet   atorvastatin  (LIPITOR) 10 MG tablet   chlorthalidone  (HYGROTON ) 25 MG tablet   famotidine  (PEPCID ) 20 MG tablet   gabapentin  (NEURONTIN ) 100 MG capsule   gabapentin  (NEURONTIN ) 300 MG capsule   hydrALAZINE  (APRESOLINE ) 100 MG tablet   hydrochlorothiazide  (HYDRODIURIL ) 50 MG tablet   hydrOXYzine  (ATARAX ) 25 MG tablet   losartan  (COZAAR ) 100 MG tablet   losartan  (COZAAR ) 50 MG tablet   meloxicam  (MOBIC ) 15 MG tablet   nicotine  (NICODERM CQ  - DOSED IN MG/24 HOURS) 14 mg/24hr patch   traMADol  (ULTRAM ) 50 MG tablet   No current facility-administered medications for this encounter.   Isaiah Ruder, PA-C Surgical Short Stay/Anesthesiology [facility] Phone [phone] [facility] Phone [phone] 07/05/2024 12:59 PM

## 2024-07-05 NOTE — Anesthesia Preprocedure Evaluation (Addendum)
 Anesthesia Evaluation  Patient identified by MRN, date of birth, ID band Patient awake    Reviewed: Allergy & Precautions, NPO status , Patient's Chart, lab work & pertinent test results  History of Anesthesia Complications Negative for: history of anesthetic complications  Airway Mallampati: IV  TM Distance: >3 FB Neck ROM: Full    Dental  (+) Teeth Intact, Dental Advisory Given, Loose,    Pulmonary neg shortness of breath, neg sleep apnea, neg COPD, neg recent URI, Current Smoker and Patient abstained from smoking.   breath sounds clear to auscultation       Cardiovascular hypertension, Pt. on medications (-) angina (-) Past MI and (-) CHF (-) dysrhythmias  Rhythm:Regular     Neuro/Psych negative neurological ROS     GI/Hepatic negative GI ROS,,,(+) Hepatitis -  Endo/Other  negative endocrine ROS    Renal/GU Renal InsufficiencyRenal diseaseLab Results      Component                Value               Date                      NA                       137                 07/05/2024                K                        3.4 (L)             07/05/2024                CO2                      22                  07/05/2024                GLUCOSE                  130 (H)             07/05/2024                BUN                      25 (H)              07/05/2024                CREATININE               1.53 (H)            07/05/2024                CALCIUM                   9.6                 07/05/2024                GFRNONAA                 53 (L)  07/05/2024                Musculoskeletal  (+) Arthritis ,    Abdominal   Peds  Hematology negative hematology ROS (+) Lab Results      Component                Value               Date                      WBC                      11.5 (H)            07/05/2024                HGB                      14.7                07/05/2024                HCT                       39.3                07/05/2024                MCV                      85.4                07/05/2024                PLT                      269                 07/05/2024              Anesthesia Other Findings   Reproductive/Obstetrics                              Anesthesia Physical Anesthesia Plan  ASA: 3  Anesthesia Plan: MAC and Spinal   Post-op Pain Management:    Induction: Intravenous  PONV Risk Score and Plan: 1 and Propofol  infusion and Treatment may vary due to age or medical condition  Airway Management Planned: Nasal Cannula, Natural Airway and Simple Face Mask  Additional Equipment: None  Intra-op Plan:   Post-operative Plan:   Informed Consent: I have reviewed the patients History and Physical, chart, labs and discussed the procedure including the risks, benefits and alternatives for the proposed anesthesia with the patient or authorized representative who has indicated his/her understanding and acceptance.     Dental advisory given  Plan Discussed with: CRNA  Anesthesia Plan Comments: (PAT note written 07/05/2024 by Allison Zelenak, PA-C.  )         Anesthesia Quick Evaluation

## 2024-07-08 ENCOUNTER — Other Ambulatory Visit (HOSPITAL_COMMUNITY): Payer: Self-pay

## 2024-07-08 ENCOUNTER — Observation Stay (HOSPITAL_COMMUNITY): Payer: MEDICAID

## 2024-07-08 ENCOUNTER — Ambulatory Visit (HOSPITAL_COMMUNITY): Payer: MEDICAID | Admitting: Vascular Surgery

## 2024-07-08 ENCOUNTER — Encounter (HOSPITAL_COMMUNITY): Payer: Self-pay | Admitting: Orthopaedic Surgery

## 2024-07-08 ENCOUNTER — Other Ambulatory Visit: Payer: Self-pay | Admitting: Physician Assistant

## 2024-07-08 ENCOUNTER — Other Ambulatory Visit: Payer: Self-pay

## 2024-07-08 ENCOUNTER — Ambulatory Visit (HOSPITAL_COMMUNITY): Payer: MEDICAID | Admitting: Anesthesiology

## 2024-07-08 ENCOUNTER — Observation Stay (HOSPITAL_COMMUNITY)
Admission: RE | Admit: 2024-07-08 | Discharge: 2024-07-09 | Disposition: A | Payer: MEDICAID | Attending: Orthopaedic Surgery | Admitting: Orthopaedic Surgery

## 2024-07-08 ENCOUNTER — Ambulatory Visit (HOSPITAL_COMMUNITY): Payer: MEDICAID

## 2024-07-08 ENCOUNTER — Encounter (HOSPITAL_COMMUNITY): Admission: RE | Disposition: A | Payer: Self-pay | Source: Home / Self Care | Attending: Orthopaedic Surgery

## 2024-07-08 DIAGNOSIS — F17210 Nicotine dependence, cigarettes, uncomplicated: Secondary | ICD-10-CM | POA: Diagnosis not present

## 2024-07-08 DIAGNOSIS — M1612 Unilateral primary osteoarthritis, left hip: Secondary | ICD-10-CM | POA: Diagnosis present

## 2024-07-08 DIAGNOSIS — I10 Essential (primary) hypertension: Secondary | ICD-10-CM | POA: Diagnosis not present

## 2024-07-08 DIAGNOSIS — Z96642 Presence of left artificial hip joint: Secondary | ICD-10-CM

## 2024-07-08 DIAGNOSIS — Z79899 Other long term (current) drug therapy: Secondary | ICD-10-CM | POA: Diagnosis not present

## 2024-07-08 HISTORY — PX: TOTAL HIP ARTHROPLASTY: SHX124

## 2024-07-08 LAB — ABO/RH: ABO/RH(D): A POS

## 2024-07-08 SURGERY — ARTHROPLASTY, HIP, TOTAL, ANTERIOR APPROACH
Anesthesia: Monitor Anesthesia Care | Site: Hip | Laterality: Left

## 2024-07-08 MED ORDER — OXYCODONE HCL 5 MG PO TABS
5.0000 mg | ORAL_TABLET | Freq: Four times a day (QID) | ORAL | 0 refills | Status: DC | PRN
  Filled 2024-07-08: qty 20, 3d supply, fill #0

## 2024-07-08 MED ORDER — CEFAZOLIN SODIUM-DEXTROSE 2-4 GM/100ML-% IV SOLN
2.0000 g | Freq: Four times a day (QID) | INTRAVENOUS | Status: AC
  Administered 2024-07-08 (×2): 2 g via INTRAVENOUS
  Filled 2024-07-08 (×2): qty 100

## 2024-07-08 MED ORDER — EPHEDRINE SULFATE-NACL 50-0.9 MG/10ML-% IV SOSY
PREFILLED_SYRINGE | INTRAVENOUS | Status: DC | PRN
  Administered 2024-07-08: 15:00:00 10 mg via INTRAVENOUS

## 2024-07-08 MED ORDER — LACTATED RINGERS IV SOLN: INTRAVENOUS | Status: DC

## 2024-07-08 MED ORDER — METHOCARBAMOL 1000 MG/10ML IJ SOLN: 500.0000 mg | Freq: Four times a day (QID) | INTRAMUSCULAR | Status: DC | PRN

## 2024-07-08 MED ORDER — ONDANSETRON HCL 4 MG PO TABS
4.0000 mg | ORAL_TABLET | Freq: Three times a day (TID) | ORAL | 0 refills | Status: AC | PRN
  Filled 2024-07-08: qty 40, 14d supply, fill #0

## 2024-07-08 MED ORDER — 0.9 % SODIUM CHLORIDE (POUR BTL) OPTIME
TOPICAL | Status: DC | PRN
  Administered 2024-07-08: 13:00:00 1000 mL

## 2024-07-08 MED ORDER — LOSARTAN POTASSIUM 50 MG PO TABS
100.0000 mg | ORAL_TABLET | Freq: Every day | ORAL | Status: DC
  Administered 2024-07-08 – 2024-07-09 (×2): 100 mg via ORAL
  Filled 2024-07-08 (×2): qty 2

## 2024-07-08 MED ORDER — DOCUSATE SODIUM 100 MG PO CAPS
100.0000 mg | ORAL_CAPSULE | Freq: Every day | ORAL | 2 refills | Status: AC | PRN
  Filled 2024-07-08: qty 30, 30d supply, fill #0

## 2024-07-08 MED ORDER — ASPIRIN 81 MG PO CHEW
81.0000 mg | CHEWABLE_TABLET | Freq: Two times a day (BID) | ORAL | Status: DC
  Administered 2024-07-08 – 2024-07-09 (×2): 81 mg via ORAL
  Filled 2024-07-08 (×2): qty 1

## 2024-07-08 MED ORDER — KETOROLAC TROMETHAMINE 15 MG/ML IJ SOLN
30.0000 mg | Freq: Once | INTRAMUSCULAR | Status: AC
  Administered 2024-07-08: 16:00:00 30 mg via INTRAVENOUS

## 2024-07-08 MED ORDER — HYDROCODONE-ACETAMINOPHEN 5-325 MG PO TABS
1.0000 | ORAL_TABLET | Freq: Three times a day (TID) | ORAL | Status: DC | PRN
  Administered 2024-07-08: 22:00:00 2 via ORAL
  Filled 2024-07-08: qty 2

## 2024-07-08 MED ORDER — KETOROLAC TROMETHAMINE 15 MG/ML IJ SOLN
INTRAMUSCULAR | Status: AC
  Filled 2024-07-08: qty 2

## 2024-07-08 MED ORDER — HYDROCODONE-ACETAMINOPHEN 7.5-325 MG PO TABS
1.0000 | ORAL_TABLET | Freq: Three times a day (TID) | ORAL | Status: DC | PRN
  Administered 2024-07-09 (×2): 2 via ORAL
  Filled 2024-07-08 (×2): qty 2

## 2024-07-08 MED ORDER — TRANEXAMIC ACID-NACL 1000-0.7 MG/100ML-% IV SOLN
1000.0000 mg | Freq: Once | INTRAVENOUS | Status: AC
  Administered 2024-07-08: 19:00:00 1000 mg via INTRAVENOUS
  Filled 2024-07-08: qty 100

## 2024-07-08 MED ORDER — PHENOL 1.4 % MT LIQD: 1.0000 | OROMUCOSAL | Status: DC | PRN

## 2024-07-08 MED ORDER — HYDROCHLOROTHIAZIDE 25 MG PO TABS
50.0000 mg | ORAL_TABLET | Freq: Every day | ORAL | Status: DC
  Administered 2024-07-08 – 2024-07-09 (×2): 50 mg via ORAL
  Filled 2024-07-08 (×2): qty 2

## 2024-07-08 MED ORDER — VANCOMYCIN HCL 1000 MG IV SOLR
INTRAVENOUS | Status: AC
  Filled 2024-07-08: qty 20

## 2024-07-08 MED ORDER — SODIUM CHLORIDE 0.9 % IR SOLN
Status: DC | PRN
  Administered 2024-07-08: 13:00:00 1000 mL

## 2024-07-08 MED ORDER — TRANEXAMIC ACID 1000 MG/10ML IV SOLN
INTRAVENOUS | Status: DC | PRN
  Administered 2024-07-08: 13:00:00 2000 mg via TOPICAL

## 2024-07-08 MED ORDER — GABAPENTIN 100 MG PO CAPS
100.0000 mg | ORAL_CAPSULE | Freq: Three times a day (TID) | ORAL | Status: DC
  Administered 2024-07-08 – 2024-07-09 (×2): 100 mg via ORAL
  Filled 2024-07-08 (×2): qty 1

## 2024-07-08 MED ORDER — HYDRALAZINE HCL 50 MG PO TABS
100.0000 mg | ORAL_TABLET | Freq: Three times a day (TID) | ORAL | Status: DC
  Administered 2024-07-08 – 2024-07-09 (×2): 100 mg via ORAL
  Filled 2024-07-08 (×2): qty 2

## 2024-07-08 MED ORDER — PROPOFOL 500 MG/50ML IV EMUL
INTRAVENOUS | Status: DC | PRN
  Administered 2024-07-08: 13:00:00 125 ug/kg/min via INTRAVENOUS

## 2024-07-08 MED ORDER — TRANEXAMIC ACID-NACL 1000-0.7 MG/100ML-% IV SOLN
1000.0000 mg | INTRAVENOUS | Status: AC
  Administered 2024-07-08: 13:00:00 1000 mg via INTRAVENOUS
  Filled 2024-07-08: qty 100

## 2024-07-08 MED ORDER — POLYETHYLENE GLYCOL 3350 17 G PO PACK
17.0000 g | PACK | Freq: Every day | ORAL | Status: DC
  Administered 2024-07-08: 22:00:00 17 g via ORAL
  Filled 2024-07-08: qty 1

## 2024-07-08 MED ORDER — BUPIVACAINE-MELOXICAM ER 400-12 MG/14ML IJ SOLN
INTRAMUSCULAR | Status: AC
  Filled 2024-07-08: qty 1

## 2024-07-08 MED ORDER — METOCLOPRAMIDE HCL 5 MG PO TABS: 5.0000 mg | ORAL_TABLET | Freq: Three times a day (TID) | ORAL | Status: DC | PRN

## 2024-07-08 MED ORDER — METHOCARBAMOL 750 MG PO TABS
750.0000 mg | ORAL_TABLET | Freq: Three times a day (TID) | ORAL | 2 refills | Status: DC | PRN
  Filled 2024-07-08: qty 30, 10d supply, fill #0

## 2024-07-08 MED ORDER — MENTHOL 3 MG MT LOZG: 1.0000 | LOZENGE | OROMUCOSAL | Status: DC | PRN

## 2024-07-08 MED ORDER — KETOROLAC TROMETHAMINE 15 MG/ML IJ SOLN
15.0000 mg | Freq: Four times a day (QID) | INTRAMUSCULAR | Status: DC
  Administered 2024-07-08 – 2024-07-09 (×3): 15 mg via INTRAVENOUS
  Filled 2024-07-08 (×3): qty 1

## 2024-07-08 MED ORDER — POVIDONE-IODINE 10 % EX SWAB
2.0000 | Freq: Once | CUTANEOUS | Status: AC
  Administered 2024-07-08: 10:00:00 2 via TOPICAL

## 2024-07-08 MED ORDER — CEFAZOLIN SODIUM-DEXTROSE 2-4 GM/100ML-% IV SOLN
2.0000 g | INTRAVENOUS | Status: AC
  Administered 2024-07-08: 13:00:00 2 g via INTRAVENOUS
  Filled 2024-07-08: qty 100

## 2024-07-08 MED ORDER — OXYCODONE HCL 5 MG/5ML PO SOLN: 5.0000 mg | Freq: Once | ORAL | Status: DC | PRN

## 2024-07-08 MED ORDER — ACETAMINOPHEN 10 MG/ML IV SOLN: 1000.0000 mg | Freq: Once | INTRAVENOUS | Status: DC | PRN

## 2024-07-08 MED ORDER — DIPHENHYDRAMINE HCL 12.5 MG/5ML PO ELIX: 25.0000 mg | ORAL_SOLUTION | ORAL | Status: DC | PRN

## 2024-07-08 MED ORDER — ALUM & MAG HYDROXIDE-SIMETH 200-200-20 MG/5ML PO SUSP: 30.0000 mL | ORAL | Status: DC | PRN

## 2024-07-08 MED ORDER — ACETAMINOPHEN 325 MG PO TABS: 325.0000 mg | ORAL_TABLET | Freq: Four times a day (QID) | ORAL | Status: DC | PRN

## 2024-07-08 MED ORDER — VANCOMYCIN HCL 1 G IV SOLR
INTRAVENOUS | Status: DC | PRN
  Administered 2024-07-08: 13:00:00 1000 mg

## 2024-07-08 MED ORDER — TRANEXAMIC ACID 1000 MG/10ML IV SOLN
2000.0000 mg | INTRAVENOUS | Status: DC
  Filled 2024-07-08: qty 20

## 2024-07-08 MED ORDER — OXYCODONE HCL 5 MG PO TABS: 5.0000 mg | ORAL_TABLET | Freq: Once | ORAL | Status: DC | PRN

## 2024-07-08 MED ORDER — APIXABAN 2.5 MG PO TABS
ORAL_TABLET | ORAL | 0 refills | Status: AC
  Filled 2024-07-08: qty 60, 30d supply, fill #0

## 2024-07-08 MED ORDER — ONDANSETRON HCL 4 MG/2ML IJ SOLN: 4.0000 mg | Freq: Four times a day (QID) | INTRAMUSCULAR | Status: DC | PRN

## 2024-07-08 MED ORDER — PRONTOSAN WOUND IRRIGATION OPTIME
TOPICAL | Status: DC | PRN
  Administered 2024-07-08: 13:00:00 350 mL

## 2024-07-08 MED ORDER — AMLODIPINE BESYLATE 10 MG PO TABS
10.0000 mg | ORAL_TABLET | Freq: Every day | ORAL | Status: DC
  Administered 2024-07-09: 10:00:00 10 mg via ORAL
  Filled 2024-07-08: qty 1

## 2024-07-08 MED ORDER — PANTOPRAZOLE SODIUM 40 MG PO TBEC
40.0000 mg | DELAYED_RELEASE_TABLET | Freq: Every day | ORAL | Status: DC
  Administered 2024-07-08 – 2024-07-09 (×2): 40 mg via ORAL
  Filled 2024-07-08 (×2): qty 1

## 2024-07-08 MED ORDER — SORBITOL 70 % SOLN
30.0000 mL | Freq: Every day | Status: DC | PRN
  Filled 2024-07-08: qty 30

## 2024-07-08 MED ORDER — MAGNESIUM CITRATE PO SOLN: 1.0000 | Freq: Once | ORAL | Status: DC | PRN

## 2024-07-08 MED ORDER — ACETAMINOPHEN 500 MG PO TABS
1000.0000 mg | ORAL_TABLET | Freq: Four times a day (QID) | ORAL | Status: DC
  Administered 2024-07-08 (×2): 1000 mg via ORAL
  Filled 2024-07-08 (×2): qty 2

## 2024-07-08 MED ORDER — ONDANSETRON HCL 4 MG PO TABS: 4.0000 mg | ORAL_TABLET | Freq: Four times a day (QID) | ORAL | Status: DC | PRN

## 2024-07-08 MED ORDER — DOCUSATE SODIUM 100 MG PO CAPS
100.0000 mg | ORAL_CAPSULE | Freq: Two times a day (BID) | ORAL | Status: DC
  Administered 2024-07-08 – 2024-07-09 (×2): 100 mg via ORAL
  Filled 2024-07-08 (×2): qty 1

## 2024-07-08 MED ORDER — METOCLOPRAMIDE HCL 5 MG/ML IJ SOLN: 5.0000 mg | Freq: Three times a day (TID) | INTRAMUSCULAR | Status: DC | PRN

## 2024-07-08 MED ORDER — PHENYLEPHRINE HCL-NACL 20-0.9 MG/250ML-% IV SOLN
INTRAVENOUS | Status: DC | PRN
  Administered 2024-07-08: 13:00:00 40 ug/min via INTRAVENOUS

## 2024-07-08 MED ORDER — ORAL CARE MOUTH RINSE: 15.0000 mL | Freq: Once | OROMUCOSAL | Status: AC

## 2024-07-08 MED ORDER — DEXAMETHASONE SOD PHOSPHATE PF 10 MG/ML IJ SOLN
10.0000 mg | Freq: Once | INTRAMUSCULAR | Status: AC
  Administered 2024-07-09: 06:00:00 10 mg via INTRAVENOUS

## 2024-07-08 MED ORDER — SODIUM CHLORIDE 0.9 % IV SOLN: INTRAVENOUS | Status: DC

## 2024-07-08 MED ORDER — METHOCARBAMOL 500 MG PO TABS
500.0000 mg | ORAL_TABLET | Freq: Four times a day (QID) | ORAL | Status: DC | PRN
  Administered 2024-07-08 – 2024-07-09 (×2): 500 mg via ORAL
  Filled 2024-07-08 (×2): qty 1

## 2024-07-08 MED ORDER — MORPHINE SULFATE (PF) 2 MG/ML IV SOLN
0.5000 mg | Freq: Four times a day (QID) | INTRAVENOUS | Status: DC | PRN
  Administered 2024-07-08: 19:00:00 1 mg via INTRAVENOUS
  Filled 2024-07-08: qty 1

## 2024-07-08 MED ORDER — FENTANYL CITRATE (PF) 100 MCG/2ML IJ SOLN: 25.0000 ug | INTRAMUSCULAR | Status: DC | PRN

## 2024-07-08 MED ORDER — CHLORHEXIDINE GLUCONATE 0.12 % MT SOLN
15.0000 mL | Freq: Once | OROMUCOSAL | Status: AC
  Administered 2024-07-08: 10:00:00 15 mL via OROMUCOSAL
  Filled 2024-07-08: qty 15

## 2024-07-08 MED ORDER — BUPIVACAINE-MELOXICAM ER 400-12 MG/14ML IJ SOLN
INTRAMUSCULAR | Status: DC | PRN
  Administered 2024-07-08: 14:00:00 400 mg

## 2024-07-08 SURGICAL SUPPLY — 48 items
BAG COUNTER SPONGE SURGICOUNT (BAG) ×1 IMPLANT
BAG DECANTER FOR FLEXI CONT (MISCELLANEOUS) ×1 IMPLANT
BLADE SAG 18X100X1.27 (BLADE) ×1 IMPLANT
COVER PERINEAL POST (MISCELLANEOUS) ×1 IMPLANT
COVER SURGICAL LIGHT HANDLE (MISCELLANEOUS) ×1 IMPLANT
CUP ACETAB W/GRIPTION 54 (Plate) IMPLANT
DERMABOND ADVANCED .7 DNX12 (GAUZE/BANDAGES/DRESSINGS) IMPLANT
DRAPE C-ARM 42X72 X-RAY (DRAPES) ×1 IMPLANT
DRAPE POUCH INSTRU U-SHP 10X18 (DRAPES) ×1 IMPLANT
DRAPE STERI IOBAN 125X83 (DRAPES) ×1 IMPLANT
DRAPE U-SHAPE 47X51 STRL (DRAPES) ×2 IMPLANT
DRSG AQUACEL AG ADV 3.5X10 (GAUZE/BANDAGES/DRESSINGS) ×1 IMPLANT
DURAPREP 26ML APPLICATOR (WOUND CARE) ×2 IMPLANT
ELECTRODE BLDE 4.0 EZ CLN MEGD (MISCELLANEOUS) ×1 IMPLANT
ELECTRODE REM PT RTRN 9FT ADLT (ELECTROSURGICAL) ×1 IMPLANT
FEMORAL STEM 12/14 TPR SZ4 HIP (Orthopedic Implant) IMPLANT
GLOVE BIOGEL PI IND STRL 7.0 (GLOVE) ×2 IMPLANT
GLOVE BIOGEL PI IND STRL 7.5 (GLOVE) ×1 IMPLANT
GLOVE BIOGEL PI MICRO STRL 7 (GLOVE) ×5 IMPLANT
GLOVE INDICATOR 7.0 STRL GRN (GLOVE) ×1 IMPLANT
GLOVE SURG SYN 7.5 PF PI (GLOVE) ×5 IMPLANT
GOWN STRL REUS W/ TWL LRG LVL3 (GOWN DISPOSABLE) IMPLANT
GOWN STRL REUS W/ TWL XL LVL3 (GOWN DISPOSABLE) ×1 IMPLANT
GOWN STRL SURGICAL XL XLNG (GOWN DISPOSABLE) ×1 IMPLANT
GOWN TOGA ZIPPER T7+ PEEL AWAY (MISCELLANEOUS) ×1 IMPLANT
HEAD CERAMIC 36 PLUS5 (Hips) IMPLANT
HOOD PEEL AWAY T7 (MISCELLANEOUS) ×1 IMPLANT
IV 0.9% NACL 1000 ML (IV SOLUTION) ×1 IMPLANT
KIT BASIN OR (CUSTOM PROCEDURE TRAY) ×1 IMPLANT
LINER NEUTRAL 54X36MM PLUS 4 (Hips) IMPLANT
MARKER SKIN DUAL TIP RULER LAB (MISCELLANEOUS) ×1 IMPLANT
NDL SPNL 18GX3.5 QUINCKE PK (NEEDLE) ×1 IMPLANT
PACK TOTAL JOINT (CUSTOM PROCEDURE TRAY) ×1 IMPLANT
PACK UNIVERSAL I (CUSTOM PROCEDURE TRAY) ×1 IMPLANT
SCREW 6.5MMX25MM (Screw) IMPLANT
SET HNDPC FAN SPRY TIP SCT (DISPOSABLE) ×1 IMPLANT
SOLUTION PRONTOSAN WOUND 350ML (IRRIGATION / IRRIGATOR) ×1 IMPLANT
SUT ETHIBOND 2 V 37 (SUTURE) ×1 IMPLANT
SUT ETHILON 2 0 FS 18 (SUTURE) IMPLANT
SUT STRATAFIX PDS+ 0 24IN (SUTURE) IMPLANT
SUT VIC AB 0 CT1 27XBRD ANBCTR (SUTURE) ×1 IMPLANT
SUT VIC AB 1 CTX36XBRD ANBCTR (SUTURE) ×1 IMPLANT
SUT VIC AB 2-0 CT1 TAPERPNT 27 (SUTURE) ×2 IMPLANT
SYR 30ML LL (SYRINGE) ×2 IMPLANT
TOWEL GREEN STERILE (TOWEL DISPOSABLE) ×1 IMPLANT
TRAY FOLEY W/BAG SLVR 16FR ST (SET/KITS/TRAYS/PACK) IMPLANT
TUBE SUCT ARGYLE STRL (TUBING) ×1 IMPLANT
YANKAUER SUCT BULB TIP NO VENT (SUCTIONS) ×1 IMPLANT

## 2024-07-08 NOTE — Discharge Instructions (Signed)

## 2024-07-08 NOTE — Plan of Care (Signed)

## 2024-07-08 NOTE — Anesthesia Procedure Notes (Signed)
 Procedure Name: LMA Insertion Date/Time: 07/08/2024 1:37 PM  Performed by: Lockie Flesher, CRNAPre-anesthesia Checklist: Patient identified, Emergency Drugs available, Suction available and Patient being monitored Patient Re-evaluated:Patient Re-evaluated prior to induction Oxygen Delivery Method: Circle System Utilized Preoxygenation: Pre-oxygenation with 100% oxygen Induction Type: IV induction Ventilation: Mask ventilation without difficulty LMA: LMA inserted LMA Size: 4.0 Number of attempts: 1 Airway Equipment and Method: Bite block Placement Confirmation: positive ETCO2 Tube secured with: Tape Dental Injury: Teeth and Oropharynx as per pre-operative assessment

## 2024-07-08 NOTE — H&P (Signed)
 PREOPERATIVE H&P  Chief Complaint: left hip osteoarthritis  HPI: Justin Moore is a 58 y.o. male who presents for surgical treatment of left hip osteoarthritis.  He denies any changes in medical history.  History reviewed. No pertinent surgical history. Social History   Socioeconomic History   Marital status: Widowed    Spouse name: Not on file   Number of children: Not on file   Years of education: Not on file   Highest education level: Not on file  Occupational History   Occupation: Holiday Representative  Tobacco Use   Smoking status: Every Day    Current packs/day: 0.50    Types: Cigarettes   Smokeless tobacco: Current  Vaping Use   Vaping status: Never Used  Substance and Sexual Activity   Alcohol use: Not Currently    Alcohol/week: 17.0 standard drinks of alcohol    Types: 17 Shots of liquor per week    Comment: 1/5 of liquor daily   Drug use: Not Currently    Types: Cocaine    Comment: not currently using   Sexual activity: Yes    Birth control/protection: Condom  Other Topics Concern   Not on file  Social History Narrative   ** Merged History Encounter **       Social Drivers of Health   Tobacco Use: High Risk (07/08/2024)   Patient History    Smoking Tobacco Use: Every Day    Smokeless Tobacco Use: Current    Passive Exposure: Not on file  Financial Resource Strain: Low Risk (12/12/2022)   [facility]   Overall Financial Resource Strain (CARDIA)    Difficulty of Paying Living Expenses: Not hard at all  Food Insecurity: No Food Insecurity (04/22/2024)   Epic    Worried About Radiation Protection Practitioner of Food in the Last Year: Never true    Ran Out of Food in the Last Year: Never true  Transportation Needs: No Transportation Needs (07/02/2024)   Epic    Lack of Transportation (Medical): No    Lack of Transportation (Non-Medical): No  Recent Concern: Transportation Needs - Unmet Transportation Needs (04/22/2024)   Epic    Lack of Transportation  (Medical): No    Lack of Transportation (Non-Medical): Yes  Physical Activity: Not on file  Stress: Not on file  Social Connections: Not on file  Depression (PHQ2-9): Low Risk (07/02/2024)   Depression (PHQ2-9)    PHQ-2 Score: 0  Alcohol Screen: Not on file  Housing: Unknown (02/23/2024)   Received from [facility]   Epic    Unable to Pay for Housing in the Last Year: Not on file    Number of Times Moved in the Last Year: Not on file    At any time in the past 12 months, were you homeless or living in a shelter (including now)?: No  Utilities: Not At Risk (04/22/2024)   Epic    Threatened with loss of utilities: No  Health Literacy: Not on file   Family History  Problem Relation Age of Onset   Hypertension Mother    Heart attack Father    Hypertension Father    Allergies[1] Prior to Admission medications  Medication Sig Start Date End Date Taking? Authorizing Provider  amLODipine  (NORVASC ) 10 MG tablet Take 1 tablet (10 mg total) by mouth daily. 07/02/24  Yes [name], PA-C  atorvastatin  (LIPITOR) 10 MG tablet Take 1 tablet (10 mg total) by mouth daily. 05/24/24  Yes [name],  PA-C  chlorthalidone  (HYGROTON ) 25 MG tablet Take 50 mg by mouth daily. 06/14/24  Yes [provider]  gabapentin  (NEURONTIN ) 100 MG capsule Take 100 mg by mouth 3 (three) times daily. 06/14/24  Yes [provider]  hydrALAZINE  (APRESOLINE ) 100 MG tablet Take 1 tablet (100 mg total) by mouth 3 (three) times daily. 04/24/24 07/23/24 Yes Arloa Suzen RAMAN, NP  hydrochlorothiazide  (HYDRODIURIL ) 50 MG tablet Take 1 tablet (50 mg total) by mouth daily. 07/02/24  Yes [name], PA-C  losartan  (COZAAR ) 100 MG tablet Take 100 mg by mouth daily. 06/14/24  Yes [provider]  meloxicam  (MOBIC ) 15 MG tablet Take 1 tablet (15 mg total) by mouth daily. 04/29/24  Yes Mayers, Cari S, PA-C  traMADol  (ULTRAM ) 50 MG tablet Take 1-2 tablets (50-100 mg  total) by mouth daily as needed. 06/25/24  Yes Jule Ronal CROME, PA-C  albuterol  (VENTOLIN  HFA) 108 (90 Base) MCG/ACT inhaler Inhale 2 puffs into the lungs every 6 (six) hours as needed for wheezing or shortness of breath. 04/24/24   Arloa Suzen RAMAN, NP  famotidine  (PEPCID ) 20 MG tablet Take 1 tablet (20 mg total) by mouth daily. Patient not taking: Reported on 07/02/2024 04/24/24   Arloa Suzen RAMAN, NP  gabapentin  (NEURONTIN ) 300 MG capsule Take 1 capsule (300 mg total) by mouth 3 (three) times daily. Patient not taking: Reported on 07/03/2024 05/24/24   Tapia Cedeno, Jorge, PA-C  hydrOXYzine  (ATARAX ) 25 MG tablet Take 1 tablet (25 mg total) by mouth every 8 (eight) hours as needed for anxiety. 04/24/24   Arloa Suzen RAMAN, NP  losartan  (COZAAR ) 50 MG tablet Take 1 tablet (50 mg total) by mouth daily. 07/02/24   [name], PA-C  nicotine  (NICODERM CQ  - DOSED IN MG/24 HOURS) 14 mg/24hr patch Place 1 patch (14 mg total) onto the skin daily. Patient not taking: Reported on 07/02/2024 04/25/24   Arloa Suzen RAMAN, NP     Positive ROS: All other systems have been reviewed and were otherwise negative with the exception of those mentioned in the HPI and as above.  Physical Exam: General: Alert, no acute distress Cardiovascular: No pedal edema Respiratory: No cyanosis, no use of accessory musculature GI: abdomen soft Skin: No lesions in the area of chief complaint Neurologic: Sensation intact distally Psychiatric: Patient is competent for consent with normal mood and affect Lymphatic: no lymphedema  MUSCULOSKELETAL: exam stable  Assessment: left hip osteoarthritis  Plan: Plan for Procedures: ARTHROPLASTY, HIP, TOTAL, ANTERIOR APPROACH  The risks benefits and alternatives were discussed with the patient including but not limited to the risks of nonoperative treatment, versus surgical intervention including infection, bleeding, nerve injury,  blood clots, cardiopulmonary  complications, morbidity, mortality, among others, and they were willing to proceed.   Ozell Cummins, MD 07/08/2024 10:20 AM     [1] No Known Allergies

## 2024-07-08 NOTE — Op Note (Signed)
 ARTHROPLASTY, HIP, TOTAL, ANTERIOR APPROACH  Procedure Note Justin Moore   [id number]  Pre-op Diagnosis: left hip osteoarthritis     Post-op Diagnosis: same  Operative Findings Severe OA   Operative Procedures  1. Total hip replacement; Left hip; uncemented [id number]   Surgeon: Kay Cummins, M.D.  Assist: Eva Staple, RNFA   Anesthesia: spinal  Prosthesis: Depuy Acetabulum: Pinnacle 54 mm Femur: Actis 4 HO Head: 36 mm size: +5 Liner: +4 neutral Bearing Type: ceramic/poly  Total Hip Arthroplasty (Anterior Approach) Op Note:  After informed consent was obtained and the operative extremity marked in the holding area, the patient was brought back to the operating room and placed supine on the HANA table. Next, the operative extremity was prepped and draped in normal sterile fashion. Surgical timeout occurred verifying patient identification, surgical site, surgical procedure and administration of antibiotics.  A 10 cm longitudinal incision was made starting from 2 fingerbreadths lateral and inferior to the ASIS towards the lateral aspect of the patella.  A Hueter approach to the hip was performed, using the interval between tensor fascia lata and sartorius.  Dissection was carried bluntly down onto the anterior hip capsule. The lateral femoral circumflex vessels were identified and coagulated. A capsulectomy was performed.  The neck osteotomy was performed. The femoral head was removed which showed severe disease, the acetabular rim was cleared of soft tissue and osteophytes and attention was turned to reaming the acetabulum.  Sequential reaming was performed under fluoroscopic guidance down to the floor of the cotyloid fossa. We reamed to a size 53 mm, and then impacted the acetabular shell. A 25 mm cancellous screw was placed to secure the shell.  A +4 neutral liner was then placed after irrigation and attention turned to the femur.  After placing the femoral hook, the leg was taken  to externally rotated, extended and adducted position taking care to perform soft tissue releases to allow for adequate mobilization of the femur. Soft tissue was cleared from the shoulder of the greater trochanter and the hook elevator used to improve exposure of the proximal femur.  Lateral bone from the shoulder was rasped away for relief.  Sequential broaching performed up to a size 4.  High offset trial neck and +1.5 head were placed. The leg was brought back up to neutral and the construct reduced.  The position and sizing of components, offset and leg lengths were checked using fluoroscopy.  Based on fluoroscopic findings, we chose to retrial with high offset neck and +5 head ball.  Stability of the construct was checked in 45 degrees of hip extension and 90 degrees of external rotation without any subluxation, shuck or impingement of prosthesis. We dislocated the prosthesis, dropped the leg back into position, removed trial components, and irrigated copiously. The final stem and head were chosen then placed, the leg brought back up, the system reduced and fluoroscopy used to verify positioning.  Antibiotic irrigation was placed in the surgical wound.   We irrigated, obtained hemostasis and closed the capsule using #2 ethibond suture.  A topical mixture of 0.25% bupivacaine  and meloxicam  was placed deep to the fascia.  One gram of vancomycin  powder was placed in the surgical bed.   One gram of topical tranexamic acid  was injected into the joint.  The fascia was closed with #1 stratafix, the deep fat layer was closed with 0 vicryl, the subcutaneous layers closed with 2.0 Vicryl Plus and the skin closed with 2.0 nylon and dermabond. A sterile dressing  was applied. The patient was awakened in the operating room and taken to recovery in stable condition.  All sponge, needle, and instrument counts were correct at the end of the case.   Position: supine  Complications: see description of procedure.  Time Out:  performed   Drains/Packing: none  Estimated blood loss: see anesthesia record  Returned to Recovery Room: in good condition.   Antibiotics: yes   Mechanical VTE (DVT) Prophylaxis: sequential compression devices, TED thigh-high  Chemical VTE (DVT) Prophylaxis: aspirin    Fluid Replacement: see anesthesia record  Specimens Removed: 1 to pathology   Sponge and Instrument Count Correct? yes   PACU: portable radiograph - low AP   Plan/RTC: Return in 2 weeks for suture removal. Weight Bearing/Load Lower Extremity: full  Hip precautions: none Suture Removal: 2 weeks   N. Ozell Cummins, MD Justin Moore 2:22 PM   Implant Name Type Inv. Item Serial No. Manufacturer Lot No. LRB No. Used Action  CUP ACETAB W/GRIPTION 54 - [id number] Plate CUP ACETAB W/GRIPTION 54  [employer] [id number] Left 1 Implanted  SCREW 6.5MMX25MM - [id number] Screw SCREW 6.5MMX25MM  [employer] [id number] Left 1 Implanted  LINER NEUTRAL 54X36MM PLUS 4 - [id number] Hips LINER NEUTRAL 54X36MM PLUS 4  [employer]TH[employer] [id number] Left 1 Implanted  FEMORAL STEM 12/14 TPR SZ4 HIP - [id number] Orthopedic Implant FEMORAL STEM 12/14 TPR SZ4 HIP  [employer] [id number] Left 1 Implanted  HEAD CERAMIC 36 PLUS5 - [id number] Hips HEAD CERAMIC 36 PLUS5  [employer] [id number] Left 1 Implanted

## 2024-07-08 NOTE — Transfer of Care (Signed)
 Immediate Anesthesia Transfer of Care Note  Patient: Justin Moore  Procedure(s) Performed: ARTHROPLASTY, HIP, TOTAL, ANTERIOR APPROACH (Left: Hip)  Patient Location: PACU  Anesthesia Type:Spinal  Level of Consciousness: awake, alert , and oriented  Airway & Oxygen Therapy: Patient Spontanous Breathing and Patient connected to nasal cannula oxygen  Post-op Assessment: Report given to RN and Post -op Vital signs reviewed and stable  Post vital signs: Reviewed and stable  Last Vitals:  Vitals Value Taken Time  BP 103/65 07/08/24 15:12  Temp 36.4 C 07/08/24 15:13  Pulse 65 07/08/24 15:14  Resp 15 07/08/24 15:14  SpO2 94 % 07/08/24 15:14  Vitals shown include unfiled device data.  Last Pain:  Vitals:   07/08/24 0941  TempSrc:   PainSc: 8       Patients Stated Pain Goal: 4 (07/08/24 0941)  Complications: No notable events documented.

## 2024-07-09 ENCOUNTER — Encounter (HOSPITAL_COMMUNITY): Payer: Self-pay | Admitting: Orthopaedic Surgery

## 2024-07-09 ENCOUNTER — Other Ambulatory Visit (HOSPITAL_COMMUNITY): Payer: Self-pay

## 2024-07-09 DIAGNOSIS — M1612 Unilateral primary osteoarthritis, left hip: Secondary | ICD-10-CM | POA: Diagnosis not present

## 2024-07-09 NOTE — Progress Notes (Signed)
 Unable to arrange [facility] due to patients insurance. Per bedside RN: Morna PA is in agreement with outpatient therapy. Referral sent and information is on the AVS for outpatient therapy.

## 2024-07-09 NOTE — Progress Notes (Signed)
 Patient awaiting family for discharge home, Patient in no acute distress nor complaints of pain nor discomfort; incision on hip is clean, dry and intact; No c/o pain at this time. Room was checked and accounted for all patient's belongings; discharge instructions concerning her medications, incision care, follow up appointment and when to call the doctor as needed were all discussed with patient and daughter by RN and they expressed understanding on the instructions given.

## 2024-07-09 NOTE — Discharge Summary (Signed)
 Patient ID: Justin Moore MRN: [id number] DOB/AGE: 56-23-1967 56 y.o.  Admit date: 07/08/2024 Discharge date: 07/09/2024  Admission Diagnoses:  Principal Problem:   Status post total replacement of left hip   Discharge Diagnoses:  Same  Past Medical History:  Diagnosis Date   Arthritis    Hypertension     Surgeries: Procedures: ARTHROPLASTY, HIP, TOTAL, ANTERIOR APPROACH on 07/08/2024   Consultants:   Discharged Condition: Improved  Hospital Course: Justin Moore is an 59 y.o. male who was admitted 07/08/2024 for operative treatment ofStatus post total replacement of left hip. Patient has severe unremitting pain that affects sleep, daily activities, and work/hobbies. After pre-op clearance the patient was taken to the operating room on 07/08/2024 and underwent  Procedures: ARTHROPLASTY, HIP, TOTAL, ANTERIOR APPROACH.    Patient was given perioperative antibiotics:  Anti-infectives (From admission, onward)    Start     Dose/Rate Route Frequency Ordered Stop   07/08/24 1900  ceFAZolin  (ANCEF ) IVPB 2g/100 mL premix        2 g 200 mL/hr over 30 Minutes Intravenous Every 6 hours 07/08/24 1804 07/08/24 2356   07/08/24 1239  vancomycin  (VANCOCIN ) powder  Status:  Discontinued          As needed 07/08/24 1239 07/08/24 1509   07/08/24 1011  ceFAZolin  (ANCEF ) IVPB 2g/100 mL premix        2 g 200 mL/hr over 30 Minutes Intravenous On call to O.R. 07/08/24 0931 07/08/24 1303        Patient was given sequential compression devices, early ambulation, and chemoprophylaxis to prevent DVT.  Inpatient Morphine  Milligram Equivalents Per Day 12/15 - 12/16   Values displayed are in units of MME/Day    Order Start / End Date Yesterday Today    oxyCODONE  (Oxy IR/ROXICODONE ) immediate release tablet 5 mg 12/15 - 12/15 0 of Unknown --    oxyCODONE  (ROXICODONE ) 5 MG/5ML solution 5 mg 12/15 - 12/15 0 of Unknown --      Group total: 0 of Unknown     HYDROcodone -acetaminophen   (NORCO/VICODIN) 5-325 MG per tablet 1-2 tablet 12/15 - No end date 10 of 5-10 0 of 15-30    HYDROcodone -acetaminophen  (NORCO) 7.5-325 MG per tablet 1-2 tablet 12/15 - No end date 0 of 7.5-15 15 of 22.5-45    morphine  (PF) 2 MG/ML injection 0.5-1 mg 12/15 - No end date 3 of 1.5-3 0 of 6-12    fentaNYL  (SUBLIMAZE ) injection 25-50 mcg 12/15 - 12/15 0 of 45-90 --    Daily Totals  13 of Unknown (at least 59-118) 15 of 43.5-87    Calculation Errors     Order Type Date Details   oxyCODONE  (Oxy IR/ROXICODONE ) immediate release tablet 5 mg Ordered Dose -- Insufficient frequency information   oxyCODONE  (ROXICODONE ) 5 MG/5ML solution 5 mg Ordered Dose -- Insufficient frequency information            Patient benefited maximally from hospital stay and there were no complications.    Recent vital signs: Patient Vitals for the past 24 hrs:  BP Temp Temp src Pulse Resp SpO2 Height Weight  07/09/24 0715 137/85 98 F (36.7 C) Oral 77 19 98 % -- --  07/09/24 0351 129/78 98.9 F (37.2 C) Oral 73 18 97 % -- --  07/08/24 2302 117/74 99.3 F (37.4 C) Oral 77 20 100 % -- --  07/08/24 1918 (!) 159/92 98 F (36.7 C) Oral 71 20 99 % -- --  07/08/24 1812 (!) 136/101 98  F (36.7 C) Oral 73 19 99 % -- --  07/08/24 1745 (!) 147/93 -- -- 65 18 96 % -- --  07/08/24 1715 (!) 138/93 (!) 97.5 F (36.4 C) -- 61 15 96 % -- --  07/08/24 1700 (!) 158/91 -- -- 61 16 97 % -- --  07/08/24 1645 120/84 -- -- 65 19 96 % -- --  07/08/24 1630 114/88 -- -- 69 16 96 % -- --  07/08/24 1615 (!) 119/98 -- -- 68 14 95 % -- --  07/08/24 1600 109/76 -- -- 68 13 95 % -- --  07/08/24 1545 109/77 -- -- 70 16 95 % -- --  07/08/24 1530 97/67 -- -- 69 11 94 % -- --  07/08/24 1515 (!) 98/56 -- -- 65 15 94 % -- --  07/08/24 1513 103/65 (!) 97.5 F (36.4 C) -- 65 19 92 % -- --  07/08/24 0921 (!) 107/92 98.4 F (36.9 C) Oral 79 20 95 % 5' 8 (1.727 m) 113.4 kg     Recent laboratory studies: No results for input(s): WBC, HGB,  HCT, PLT, NA, K, CL, CO2, BUN, CREATININE, GLUCOSE, INR, CALCIUM  in the last 72 hours.  Invalid input(s): PT, 2   Discharge Medications:   Allergies as of 07/09/2024   No Known Allergies      Medication List     STOP taking these medications    famotidine  20 MG tablet Commonly known as: PEPCID    meloxicam  15 MG tablet Commonly known as: MOBIC    nicotine  14 mg/24hr patch Commonly known as: NICODERM CQ  - dosed in mg/24 hours   traMADol  50 MG tablet Commonly known as: ULTRAM        TAKE these medications    albuterol  108 (90 Base) MCG/ACT inhaler Commonly known as: VENTOLIN  HFA Inhale 2 puffs into the lungs every 6 (six) hours as needed for wheezing or shortness of breath.   amLODipine  10 MG tablet Commonly known as: NORVASC  Take 1 tablet (10 mg total) by mouth daily.   apixaban  2.5 MG Tabs tablet Commonly known as: Eliquis  Take one tab po bid x 30 days after surgery to prevent blood clots   atorvastatin  10 MG tablet Commonly known as: LIPITOR Take 1 tablet (10 mg total) by mouth daily.   chlorthalidone  25 MG tablet Commonly known as: HYGROTON  Take 50 mg by mouth daily.   docusate sodium  100 MG capsule Commonly known as: Colace Take 1 capsule (100 mg total) by mouth daily as needed.   gabapentin  100 MG capsule Commonly known as: NEURONTIN  Take 100 mg by mouth 3 (three) times daily. What changed: Another medication with the same name was removed. Continue taking this medication, and follow the directions you see here.   hydrALAZINE  100 MG tablet Commonly known as: APRESOLINE  Take 1 tablet (100 mg total) by mouth 3 (three) times daily.   hydrochlorothiazide  50 MG tablet Commonly known as: HYDRODIURIL  Take 1 tablet (50 mg total) by mouth daily.   hydrOXYzine  25 MG tablet Commonly known as: ATARAX  Take 1 tablet (25 mg total) by mouth every 8 (eight) hours as needed for anxiety.   losartan  100 MG tablet Commonly known as:  COZAAR  Take 100 mg by mouth daily. What changed: Another medication with the same name was removed. Continue taking this medication, and follow the directions you see here.   methocarbamol  750 MG tablet Commonly known as: ROBAXIN  Take 1 tablet (750 mg total) by mouth 3 (three) times daily as needed.  ondansetron  4 MG tablet Commonly known as: Zofran  Take 1 tablet (4 mg total) by mouth every 8 (eight) hours as needed for nausea or vomiting.   oxyCODONE  5 MG immediate release tablet Commonly known as: Roxicodone  Take 1-2 tablets (5-10 mg total) by mouth every 6 (six) hours as needed.               Durable Medical Equipment  (From admission, onward)           Start     Ordered   07/08/24 1805  DME Walker rolling  Once       Question:  Patient needs a walker to treat with the following condition  Answer:  History of hip replacement   07/08/24 1804   07/08/24 1805  DME 3 n 1  Once        07/08/24 1804   07/08/24 1805  DME Bedside commode  Once       Question:  Patient needs a bedside commode to treat with the following condition  Answer:  History of hip replacement   07/08/24 1804            Diagnostic Studies: DG Chest Port 1 View Result Date: 07/08/2024 EXAM: 1 VIEW XRAY OF THE CHEST 07/08/2024 04:19:00 PM COMPARISON: None available. CLINICAL HISTORY: Dyspnea FINDINGS: LUNGS AND PLEURA: No focal pulmonary opacity. No pleural effusion. No pneumothorax. HEART AND MEDIASTINUM: No acute abnormality of the cardiac and mediastinal silhouettes. BONES AND SOFT TISSUES: No acute osseous abnormality. IMPRESSION: 1. No acute cardiopulmonary process identified. Electronically signed by: Oneil Devonshire MD 07/08/2024 08:18 PM EST RP Workstation: [id number]   DG Pelvis Portable Result Date: 07/08/2024 EXAM: 1 OR 2 VIEW(S) XRAY OF THE PELVIS 07/08/2024 04:19:00 PM COMPARISON: None available. CLINICAL HISTORY: Pain FINDINGS: BONES AND JOINTS: Total left hip arthroplasty in place. No  acute fracture. No malalignment. SOFT TISSUES: Subcutaneous soft tissue edema and emphysema consistent with postsurgical changes. IMPRESSION: 1. Total left hip arthroplasty in place. 2. Subcutaneous soft tissue edema and emphysema consistent with postsurgical changes. Electronically signed by: Oneil Devonshire MD 07/08/2024 08:16 PM EST RP Workstation: [id number]   DG HIP UNILAT WITH PELVIS 1V LEFT Result Date: 07/08/2024 CLINICAL DATA:  Elective surgery. EXAM: DG HIP (WITH OR WITHOUT PELVIS) 1V*L* COMPARISON:  None Available. FINDINGS: Ten fluoroscopic spot views of the pelvis and left hip obtained in the operating room. Sequential images during hip arthroplasty. Fluoroscopy time 15.9 seconds. Dose 2.19 mGy. IMPRESSION: Intraoperative fluoroscopy during left hip arthroplasty. Electronically Signed   By: Andrea Gasman M.D.   On: 07/08/2024 15:01   DG C-Arm 1-60 Min-No Report Result Date: 07/08/2024 Fluoroscopy was utilized by the requesting physician.  No radiographic interpretation.   DG C-Arm 1-60 Min-No Report Result Date: 07/08/2024 Fluoroscopy was utilized by the requesting physician.  No radiographic interpretation.    Disposition: Discharge disposition: 01-Home or Self Care          Follow-up Information     Jule Ronal CROME, PA-C. Schedule an appointment as soon as possible for a visit in 2 week(s).   Specialty: Orthopedic Surgery Contact information: [address] [place] [address] [phone]                  Signed: Ronal CROME Jule 07/09/2024, 8:00 AM

## 2024-07-09 NOTE — Anesthesia Postprocedure Evaluation (Signed)
 Anesthesia Post Note  Patient: Justin Moore  Procedure(s) Performed: ARTHROPLASTY, HIP, TOTAL, ANTERIOR APPROACH (Left: Hip)     Patient location during evaluation: PACU Anesthesia Type: MAC and Spinal Level of consciousness: awake and alert Pain management: pain level controlled Vital Signs Assessment: post-procedure vital signs reviewed and stable Respiratory status: spontaneous breathing, nonlabored ventilation and respiratory function stable Cardiovascular status: stable and blood pressure returned to baseline Postop Assessment: no apparent nausea or vomiting Anesthetic complications: no   No notable events documented.                  Cristoval Teall

## 2024-07-09 NOTE — Progress Notes (Addendum)
 Subjective: 1 Day Post-Op Procedures (LRB): ARTHROPLASTY, HIP, TOTAL, ANTERIOR APPROACH (Left) Patient reports pain as mild.    Objective: Vital signs in last 24 hours: Temp:  [97.5 F (36.4 C)-99.3 F (37.4 C)] 98 F (36.7 C) (12/16 0715) Pulse Rate:  [61-79] 77 (12/16 0715) Resp:  [11-20] 19 (12/16 0715) BP: (97-159)/(56-101) 137/85 (12/16 0715) SpO2:  [92 %-100 %] 98 % (12/16 0715) Weight:  [113.4 kg] 113.4 kg (12/15 0921)  Intake/Output from previous day: 12/15 0701 - 12/16 0700 In: 1240 [P.O.:240; I.V.:800; IV Piggyback:200] Out: 75 [Urine:25; Blood:50] Intake/Output this shift: No intake/output data recorded.  No results for input(s): HGB in the last 72 hours. No results for input(s): WBC, RBC, HCT, PLT in the last 72 hours. No results for input(s): NA, K, CL, CO2, BUN, CREATININE, GLUCOSE, CALCIUM  in the last 72 hours. No results for input(s): LABPT, INR in the last 72 hours.  Neurologically intact Neurovascular intact Sensation intact distally Intact pulses distally Dorsiflexion/Plantar flexion intact Incision: dressing C/D/I No cellulitis present Compartment soft   Assessment/Plan: 1 Day Post-Op Procedures (LRB): ARTHROPLASTY, HIP, TOTAL, ANTERIOR APPROACH (Left) Advance diet Up with therapy D/C IV fluids Discharge home with home health once cleared by PT WBAT LLE Post-op meds sent to [facility] 07/09/2024, 7:58 AM

## 2024-07-09 NOTE — Evaluation (Signed)
 Physical Therapy Evaluation  Patient Details Name: Justin Moore MRN: [id number] DOB: 1966/05/01 Today's Date: 07/09/2024  History of Present Illness  Pt is a 58 y/o male who presents s/p L THA, direct anterior approach on 07/08/2024. PMH significant for HTN  Clinical Impression  Pt is s/p THA resulting in the deficits listed below (see PT Problem List). At the time of PT eval pt was able to perform transfers and ambulation with gross CGA to min assist and RW for support. Reviewed HEP, and pt was educated on car transfer, activity progression, and general safety with mobility at home. Pt will benefit from acute skilled PT to increase their independence and safety with mobility to facilitate discharge.          If plan is discharge home, recommend the following: A little help with walking and/or transfers;A little help with bathing/dressing/bathroom;Assistance with cooking/housework;Assist for transportation;Help with stairs or ramp for entrance   Can travel by private vehicle        Equipment Recommendations Rolling walker (2 wheels);BSC/3in1;Other (comment) Public Relations Account Executive)  Recommendations for Other Services       Functional Status Assessment Patient has had a recent decline in their functional status and demonstrates the ability to make significant improvements in function in a reasonable and predictable amount of time.     Precautions / Restrictions Precautions Precautions: Fall Recall of Precautions/Restrictions: Intact Restrictions Weight Bearing Restrictions Per Provider Order: No      Mobility  Bed Mobility Overal bed mobility: Needs Assistance Bed Mobility: Supine to Sit, Sit to Supine     Supine to sit: Supervision Sit to supine: Min assist   General bed mobility comments: Assist for LE elevation back up into bed at end of session.    Transfers Overall transfer level: Needs assistance Equipment used: Rolling walker (2 wheels) Transfers: Sit to/from  Stand Sit to Stand: Contact guard assist           General transfer comment: Close guard for safety as pt powered up to full stand.    Ambulation/Gait Ambulation/Gait assistance: Contact guard assist Gait Distance (Feet): 300 Feet Assistive device: Rolling walker (2 wheels) Gait Pattern/deviations: Step-through pattern, Decreased stride length, Trunk flexed, Decreased dorsiflexion - left, Decreased weight shift to left Gait velocity: Decreased Gait velocity interpretation: 1.31 - 2.62 ft/sec, indicative of limited community ambulator   General Gait Details: VC's for improved posture, closer walker proximity and forward gaze. Pt able to improve heel strike with cues.  Stairs Stairs: Yes Stairs assistance: Contact guard assist Stair Management: One rail Right, Step to pattern, Forwards Number of Stairs: 10 General stair comments: VC's for sequencing and general safety.  Wheelchair Mobility     Tilt Bed    Modified Rankin (Stroke Patients Only)       Balance Overall balance assessment: Needs assistance Sitting-balance support: Feet supported, No upper extremity supported Sitting balance-Leahy Scale: Fair     Standing balance support: Bilateral upper extremity supported, Reliant on assistive device for balance, During functional activity Standing balance-Leahy Scale: Poor                               Pertinent Vitals/Pain Pain Assessment Pain Assessment: Faces Faces Pain Scale: Hurts little more Pain Location: incision site Pain Descriptors / Indicators: Operative site guarding, Sore Pain Intervention(s): Limited activity within patient's tolerance, Monitored during session, Repositioned    Home Living Family/patient expects to be discharged to:: Private residence  Living Arrangements: Children;Other relatives Available Help at Discharge: Family;Available 24 hours/day   Home Access: Level entry     Alternate Level Stairs-Number of Steps:  flight Home Layout: Two level;Able to live on main level with bedroom/bathroom        Prior Function Prior Level of Function : Independent/Modified Independent                     Extremity/Trunk Assessment   Upper Extremity Assessment Upper Extremity Assessment: Overall WFL for tasks assessed    Lower Extremity Assessment Lower Extremity Assessment: LLE deficits/detail LLE Deficits / Details: Decreased strength and AROM consistent with pre-op diganosis LLE: Unable to fully assess due to pain    Cervical / Trunk Assessment Cervical / Trunk Assessment: Normal  Communication   Communication Communication: No apparent difficulties    Cognition Arousal: Alert Behavior During Therapy: WFL for tasks assessed/performed   PT - Cognitive impairments: No apparent impairments                         Following commands: Intact       Cueing Cueing Techniques: Verbal cues, Gestural cues     General Comments      Exercises Total Joint Exercises Ankle Circles/Pumps: 10 reps, Both, AROM Quad Sets: 10 reps, Left, AROM Short Arc Quad: 5 reps, Left, AAROM Heel Slides: 10 reps, Left, AROM Hip ABduction/ADduction: 5 reps, Left, AAROM Long Arc Quad: 5 reps, Left, AAROM   Assessment/Plan    PT Assessment Patient needs continued PT services  PT Problem List Decreased strength;Decreased activity tolerance;Decreased balance;Decreased mobility;Decreased knowledge of use of DME;Decreased safety awareness;Decreased knowledge of precautions;Pain       PT Treatment Interventions DME instruction;Gait training;Stair training;Functional mobility training;Therapeutic exercise;Therapeutic activities;Balance training;Patient/family education    PT Goals (Current goals can be found in the Care Plan section)  Acute Rehab PT Goals Patient Stated Goal: Home this morning PT Goal Formulation: With patient Time For Goal Achievement: 07/16/24 Potential to Achieve Goals: Good     Frequency 7X/week     Co-evaluation               AM-PAC PT 6 Clicks Mobility  Outcome Measure Help needed turning from your back to your side while in a flat bed without using bedrails?: A Little Help needed moving from lying on your back to sitting on the side of a flat bed without using bedrails?: A Little Help needed moving to and from a bed to a chair (including a wheelchair)?: A Little Help needed standing up from a chair using your arms (e.g., wheelchair or bedside chair)?: A Little Help needed to walk in hospital room?: A Little Help needed climbing 3-5 steps with a railing? : A Little 6 Click Score: 18    End of Session Equipment Utilized During Treatment: Gait belt Activity Tolerance: Patient tolerated treatment well Patient left: in bed;with call bell/phone within reach Nurse Communication: Mobility status PT Visit Diagnosis: Unsteadiness on feet (R26.81);Pain Pain - Right/Left: Left Pain - part of body: Hip    Time: 9059-8985 PT Time Calculation (min) (ACUTE ONLY): 34 min   Charges:   PT Evaluation $PT Eval Low Complexity: 1 Low PT Treatments $Gait Training: 8-22 mins PT General Charges $$ ACUTE PT VISIT: 1 Visit         Leita Sable, PT, DPT Acute Rehabilitation Services Secure Chat Preferred Office: [phone]   Leita JONETTA Sable 07/09/2024, 12:54 PM

## 2024-07-11 ENCOUNTER — Ambulatory Visit: Payer: MEDICAID | Admitting: Nurse Practitioner

## 2024-07-11 ENCOUNTER — Ambulatory Visit: Payer: MEDICAID

## 2024-07-11 ENCOUNTER — Other Ambulatory Visit: Payer: Self-pay

## 2024-07-11 DIAGNOSIS — M25552 Pain in left hip: Secondary | ICD-10-CM | POA: Insufficient documentation

## 2024-07-11 DIAGNOSIS — R600 Localized edema: Secondary | ICD-10-CM | POA: Diagnosis present

## 2024-07-11 DIAGNOSIS — M6281 Muscle weakness (generalized): Secondary | ICD-10-CM | POA: Diagnosis present

## 2024-07-11 DIAGNOSIS — M1612 Unilateral primary osteoarthritis, left hip: Secondary | ICD-10-CM | POA: Insufficient documentation

## 2024-07-11 DIAGNOSIS — R262 Difficulty in walking, not elsewhere classified: Secondary | ICD-10-CM | POA: Diagnosis present

## 2024-07-11 NOTE — Therapy (Signed)
 OUTPATIENT PHYSICAL THERAPY LOWER EXTREMITY EVALUATION   Patient Name: Justin Moore MRN: [id number] DOB:10-15-1965, 59 y.o., male Today's Date: 07/11/2024  END OF SESSION:  PT End of Session - 07/11/24 1639     Visit Number 1    Number of Visits 17    Date for Recertification  09/13/24    Authorization Type [employer]    PT Start Time 1505    PT Stop Time 1550    PT Time Calculation (min) 45 min    Activity Tolerance Patient tolerated treatment well    Behavior During Therapy WFL for tasks assessed/performed          Past Medical History:  Diagnosis Date   Arthritis    Hypertension    Past Surgical History:  Procedure Laterality Date   TOTAL HIP ARTHROPLASTY Left 07/08/2024   Procedure: ARTHROPLASTY, HIP, TOTAL, ANTERIOR APPROACH;  Surgeon: [name], MD;  Location: [facility] OR;  Service: Orthopedics;  Laterality: Left;  3-C   Patient Active Problem List   Diagnosis Date Noted   Status post total replacement of left hip 07/08/2024   Primary osteoarthritis of left hip 06/11/2024   Pain of left hip 05/27/2024   Mixed hyperlipidemia 05/24/2024   Alcohol withdrawal delirium, acute, hyperactive (HCC) 12/26/2021   Thrombocytopenia 12/26/2021   Hypokalemia 12/26/2021   Hypertensive emergency 12/26/2021   Alcohol withdrawal (HCC) 12/26/2021   Alcoholic hepatitis without ascites (HCC) 05/20/2021   Alcohol use disorder 05/20/2021   Smoking 12/05/2019   Elevated lipids 12/05/2019   Leukocytosis 12/05/2019   Encounter to establish care 11/28/2019   Essential hypertension 11/28/2019    PCP: [name], PA-C  REFERRING PROVIDER: [name], MD   REFERRING DIAG: [phone] (ICD-10-CM) - Primary osteoarthritis of left hip   THERAPY DIAG:  Pain in left hip  Muscle weakness (generalized)  Localized edema  Difficulty in walking, not elsewhere classified  Rationale for Evaluation and Treatment: Rehabilitation  ONSET DATE: 07/08/24  DOS  SUBJECTIVE:   SUBJECTIVE STATEMENT: Pt reports today he started to experience an increase in pain with his L hip.  PERTINENT HISTORY: High BMI, smokes cigarettes, arthritic L knee  PAIN:  Are you having pain? Yes: NPRS scale: 7-10/10 Pain location: L ant/lat hip Pain description: ache, throb, sharp Aggravating factors: moving around, when pain med wears off Relieving factors: Finding the right position  PRECAUTIONS: None  RED FLAGS: None   WEIGHT BEARING RESTRICTIONS: No WBAT  FALLS:  Has patient fallen in last 6 months? No  LIVING ENVIRONMENT: Lives with: lives with their family Lives in: House/apartment Stairs: Yes: Internal: 14 steps; on left going up and External: 0 steps; NA Has following equipment at home: Walker - 2 wheeled  OCCUPATION: Heavy arboriculturist  PLOF: Independent  PATIENT GOALS: To walk and move around as best as I can.  NEXT MD VISIT: 07/23/24  OBJECTIVE:  Note: Objective measures were completed at Evaluation unless otherwise noted.  PATIENT SURVEYS:  LEFS:0/80=0%  COGNITION: Overall cognitive status: Within functional limits for tasks assessed     SENSATION: WFL  EDEMA:  L ant/lateral hip  MUSCLE LENGTH: Hamstrings: Right NT deg; Left NT deg Debby test: Right NT deg; Left NT deg  POSTURE: anterior pelvic tilt  PALPATION: TTP to the peri ant/lat L hip  LOWER EXTREMITY ROM:  Active ROM Right eval Left eval  Hip flexion 40   Hip extension 15 lacking   Hip abduction    Hip adduction  Hip internal rotation    Hip external rotation    Knee flexion    Knee extension    Ankle dorsiflexion    Ankle plantarflexion    Ankle inversion    Ankle eversion     (Blank rows = not tested)  LOWER EXTREMITY MMT MMT Right eval Left eval  Hip flexion 2 pain   Hip extension 2 pain   Hip abduction 2 pain   Hip adduction    Hip internal rotation    Hip external rotation 2pain   Knee flexion    Knee extension     Ankle dorsiflexion    Ankle plantarflexion    Ankle inversion    Ankle eversion     (Blank rows = not tested)  FUNCTIONAL TESTS:  5 times sit to stand: Assess when pt is able to walk s an AD 2 minute walk test: Assess when pt is able to walk s an AD  GAIT: Distance walked: 200' Assistive device utilized: Environmental Consultant - 2 wheeled Level of assistance: Modified independence Comments: Walks c a heel to toe gait pattern                                                                                                             TREATMENT DATE:  [facility] Adult PT Treatment:                                                DATE: 07/11/24 Therapeutic Exercise: Developed, instructed in, and pt completed therex as noted in HEP  Self Care: Use of cold pack and elevation peridically during the day to swelling and pain management Eval findings and purpose of PT  PATIENT EDUCATION:  Education details: Eval findings, POC, HEP, self care  Person educated: Patient Education method: Explanation, Demonstration, Tactile cues, Verbal cues, and Handouts Education comprehension: verbalized understanding, returned demonstration, verbal cues required, and tactile cues required  HOME EXERCISE PROGRAM: Access Code: [email or web address] URL: [email or web address] Date: 07/11/2024 Prepared by: [name]  Exercises - Ankle Pumps with Compression Garment  - 1 x daily - 7 x weekly - 2 sets - 10 reps - Supine Gluteal Sets  - 1 x daily - 7 x weekly - 1-2 sets - 10 reps - 5 hold - Supine Quad Set  - 2 x daily - 7 x weekly - 1-2 sets - 10 reps - 5 hold - Supine Heel Slides  - 1 x daily - 7 x weekly - 1-2 sets - 10 reps - 5 hold - Supine Knee Extension Strengthening  - 1 x daily - 7 x weekly - 1-2 sets - 10 reps - 3 hold - Active Straight Leg Raise with Quad Set (Mirrored)  - 1 x daily - 7 x weekly - 1-2 sets - 10 reps - 3 hold - Supine Hip Abduction  - 1 x daily - 7 x weekly - 1-2 sets -  10 reps - 3  hold  ASSESSMENT:  CLINICAL IMPRESSION: Patient is a 58 y.o. male who was seen today for physical therapy evaluation and treatment for M16.12 (ICD-10-CM) - Primary osteoarthritis of left hip. Pt presents with signs and symptoms consistent with undergoing a recent L THA. Pt reports he begin to experience an increase in pain today, and due to pain, active movements of the L hip were limited. Pt did demonstrate a good heel to toe pattern with gait using a RW. Pt will benefit from skilled PT 2w8 to address impairments to optimize L hip/LE function with less pain.   OBJECTIVE IMPAIRMENTS: decreased activity tolerance, decreased balance, difficulty walking, decreased ROM, decreased strength, pain, and high BMI.   ACTIVITY LIMITATIONS: carrying, lifting, bending, sitting, standing, squatting, sleeping, stairs, transfers, bed mobility, bathing, toileting, dressing, hygiene/grooming, locomotion level, and caring for others  PARTICIPATION LIMITATIONS: meal prep, cleaning, laundry, driving, shopping, community activity, and occupation  PERSONAL FACTORS: Past/current experiences, Time since onset of injury/illness/exacerbation, and 1-2 comorbidities: High BMI, smokes cigarettes are also affecting patient's functional outcome.   REHAB POTENTIAL: Good  CLINICAL DECISION MAKING: Evolving/moderate complexity  EVALUATION COMPLEXITY: Moderate   GOALS:  SHORT TERM GOALS: Target date: 07/26/24 Pt will be Ind in an initial HEP  Baseline:started Goal status: INITIAL  2.  Increase L hip AROM to 5-75d to progress functional ROM of the L hip  Baseline: 15-40 Goal status: INITIAL  LONG TERM GOALS: Target date: 09/14/23  Pt will be Ind in a final HEP to maintain achieved LOF  Baseline: started Goal status: INITIAL  2.  Increase L hip AROM to 0-100d to progress functional use L hip/LE Baseline: 15-40 Goal status: INITIAL  3.  Increased L hip strength to 4/5 to progress functional use of the L  hip/LE Baseline:  Goal status: INITIAL  4.  Improve 5xSTS by MCID of 5 and by MCID of 31ft as indication of improved functional mobility  Baseline: Assess when pt is able to walk s an AD Goal status: INITIAL  5.  Pt will progress to Ind amb of 500' for community ambulation and for mod Ind to asc/dsc 12 step c alt pattern c HR Baseline: Mod ind c RW, mod ind c HR for steps step to pattern  Goal status: INITIAL  6.  Pt's LEFS score will improve to 50% or greater as indication of improved function  Baseline: 0% Goal status: INITIAL   PLAN:  PT FREQUENCY: 2x/week  PT DURATION: 8 weeks  PLANNED INTERVENTIONS: 97164- PT Re-evaluation, 97110-Therapeutic exercises, 97530- Therapeutic activity, 97112- Neuromuscular re-education, 97535- Self Care, [id number]- Manual therapy, [phone]- Gait training, Patient/Family education, Balance training, Stair training, Taping, Joint mobilization, Cryotherapy, and Moist heat  PLAN FOR NEXT SESSION: Assess response to HEP; progress therex as indicated; use of modalities, manual therapy as indicated.    Nilay Mangrum MS, PT 07/11/2024 5:02 PM   For all possible CPT codes, reference the Planned Interventions line above.     Check all conditions that are expected to impact treatment: {Conditions expected to impact treatment:Musculoskeletal disorders   If treatment provided at initial evaluation, no treatment charged due to lack of authorization.

## 2024-07-12 NOTE — Telephone Encounter (Signed)
 HHPT is fine.  Thanks.

## 2024-07-14 ENCOUNTER — Other Ambulatory Visit: Payer: Self-pay | Admitting: Physician Assistant

## 2024-07-15 ENCOUNTER — Other Ambulatory Visit: Payer: Self-pay | Admitting: Physician Assistant

## 2024-07-15 MED ORDER — OXYCODONE HCL 5 MG PO TABS: 5.0000 mg | ORAL_TABLET | Freq: Three times a day (TID) | ORAL | 0 refills | Status: AC | PRN

## 2024-07-17 ENCOUNTER — Ambulatory Visit: Payer: MEDICAID | Admitting: Physical Therapy

## 2024-07-23 ENCOUNTER — Ambulatory Visit (INDEPENDENT_AMBULATORY_CARE_PROVIDER_SITE_OTHER): Payer: MEDICAID | Admitting: Physician Assistant

## 2024-07-23 DIAGNOSIS — Z96642 Presence of left artificial hip joint: Secondary | ICD-10-CM

## 2024-07-23 MED ORDER — HYDROCODONE-ACETAMINOPHEN 5-325 MG PO TABS: 1.0000 | ORAL_TABLET | Freq: Three times a day (TID) | ORAL | 0 refills | Status: AC | PRN

## 2024-07-23 MED ORDER — METHOCARBAMOL 750 MG PO TABS: 750.0000 mg | ORAL_TABLET | Freq: Three times a day (TID) | ORAL | 2 refills | Status: AC | PRN

## 2024-07-23 NOTE — Progress Notes (Addendum)
 "  Post-Op Visit Note   Patient: Justin Moore           Date of Birth: 10-05-1965           MRN: [id number] Visit Date: 07/23/2024 PCP: [name], PA-C   Assessment & Plan:  Chief Complaint:  Chief Complaint  Patient presents with   Left Hip - Routine Post Op    L THA-07/08/2024   Visit Diagnoses:  1. Status post total replacement of left hip     Plan: Patient is a pleasant 58 year old gentleman who comes in today 2 weeks status post left total hip replacement.  Has been doing okay.  Has been taking oxycodone  and Robaxin  for pain.  He has been getting outpatient physical therapy and is ambulating with a walker.  He has not been taking the Eliquis  that was prescribed.  Examination of his left hip reveals a well-healed surgical incision with nylon sutures in place.  No evidence of infection or cellulitis.  Calves are soft nontender.  He is neurovascular intact distally.  Today, sutures were removed and Steri-Strips applied.  Will continue with PT.  He tells me he has a bag of medicine from the hospital that he has not yet opened so I have encouraged him to obtain the Eliquis  and start taking this as soon as possible.  Like for him to take this for another 4 weeks.  Follow-up in 4 weeks for repeat evaluation and AP pelvis x-rays.  I refilled Norco and Robaxin .  Call with concerns or questions.  Follow-Up Instructions: Return in about 4 weeks (around 08/20/2024) for with xu.   Orders:  No orders of the defined types were placed in this encounter.  Meds ordered this encounter  Medications   HYDROcodone -acetaminophen  (NORCO/VICODIN) 5-325 MG tablet    Sig: Take 1 tablet by mouth 3 (three) times daily as needed.    Dispense:  21 tablet    Refill:  0   methocarbamol  (ROBAXIN ) 750 MG tablet    Sig: Take 1 tablet (750 mg total) by mouth 3 (three) times daily as needed.    Dispense:  30 tablet    Refill:  2    Imaging: No new imaging  PMFS History: Patient Active Problem  List   Diagnosis Date Noted   Status post total replacement of left hip 07/08/2024   Primary osteoarthritis of left hip 06/11/2024   Pain of left hip 05/27/2024   Mixed hyperlipidemia 05/24/2024   Alcohol withdrawal delirium, acute, hyperactive (HCC) 12/26/2021   Thrombocytopenia 12/26/2021   Hypokalemia 12/26/2021   Hypertensive emergency 12/26/2021   Alcohol withdrawal (HCC) 12/26/2021   Alcoholic hepatitis without ascites (HCC) 05/20/2021   Alcohol use disorder 05/20/2021   Smoking 12/05/2019   Elevated lipids 12/05/2019   Leukocytosis 12/05/2019   Encounter to establish care 11/28/2019   Essential hypertension 11/28/2019   Past Medical History:  Diagnosis Date   Arthritis    Hypertension     Family History  Problem Relation Age of Onset   Hypertension Mother    Heart attack Father    Hypertension Father     Past Surgical History:  Procedure Laterality Date   TOTAL HIP ARTHROPLASTY Left 07/08/2024   Procedure: ARTHROPLASTY, HIP, TOTAL, ANTERIOR APPROACH;  Surgeon: [name], MD;  Location: [facility] OR;  Service: Orthopedics;  Laterality: Left;  3-C   Social History   Occupational History   Occupation: Holiday Representative  Tobacco Use   Smoking status: Every Day  Current packs/day: 0.50    Types: Cigarettes   Smokeless tobacco: Current  Vaping Use   Vaping status: Never Used  Substance and Sexual Activity   Alcohol use: Not Currently    Alcohol/week: 17.0 standard drinks of alcohol    Types: 17 Shots of liquor per week    Comment: 1/5 of liquor daily   Drug use: Not Currently    Types: Cocaine    Comment: not currently using   Sexual activity: Yes    Birth control/protection: Condom     "

## 2024-07-24 ENCOUNTER — Ambulatory Visit: Payer: MEDICAID | Admitting: Physical Therapy

## 2024-07-24 ENCOUNTER — Encounter: Payer: Self-pay | Admitting: Physical Therapy

## 2024-07-24 DIAGNOSIS — M25552 Pain in left hip: Secondary | ICD-10-CM

## 2024-07-24 DIAGNOSIS — R600 Localized edema: Secondary | ICD-10-CM

## 2024-07-24 DIAGNOSIS — M6281 Muscle weakness (generalized): Secondary | ICD-10-CM

## 2024-07-24 DIAGNOSIS — R262 Difficulty in walking, not elsewhere classified: Secondary | ICD-10-CM

## 2024-07-24 NOTE — Therapy (Signed)
 " OUTPATIENT PHYSICAL THERAPY DAILY NOTE   Patient Name: Justin Moore MRN: [id number] DOB:1965/11/24, 58 y.o., male Today's Date: 07/24/2024  END OF SESSION:  PT End of Session - 07/24/24 0803     Visit Number 2    Number of Visits 17    Date for Recertification  09/13/24    Authorization Type [employer]    PT Start Time 0803    PT Stop Time 0843    PT Time Calculation (min) 40 min    Activity Tolerance Patient tolerated treatment well    Behavior During Therapy [facility] for tasks assessed/performed          Past Medical History:  Diagnosis Date   Arthritis    Hypertension    Past Surgical History:  Procedure Laterality Date   TOTAL HIP ARTHROPLASTY Left 07/08/2024   Procedure: ARTHROPLASTY, HIP, TOTAL, ANTERIOR APPROACH;  Surgeon: Justin Kay HERO, MD;  Location: [facility] OR;  Service: Orthopedics;  Laterality: Left;  3-C   Patient Active Problem List   Diagnosis Date Noted   Status post total replacement of left hip 07/08/2024   Primary osteoarthritis of left hip 06/11/2024   Pain of left hip 05/27/2024   Mixed hyperlipidemia 05/24/2024   Alcohol withdrawal delirium, acute, hyperactive (HCC) 12/26/2021   Thrombocytopenia 12/26/2021   Hypokalemia 12/26/2021   Hypertensive emergency 12/26/2021   Alcohol withdrawal (HCC) 12/26/2021   Alcoholic hepatitis without ascites (HCC) 05/20/2021   Alcohol use disorder 05/20/2021   Smoking 12/05/2019   Elevated lipids 12/05/2019   Leukocytosis 12/05/2019   Encounter to establish care 11/28/2019   Essential hypertension 11/28/2019    PCP: Justin Lucas Fox, PA-C  REFERRING PROVIDER: [name], MD   REFERRING DIAG: [phone] (ICD-10-CM) - Primary osteoarthritis of left hip   THERAPY DIAG:  Pain in left hip  Muscle weakness (generalized)  Localized edema  Difficulty in walking, not elsewhere classified  Rationale for Evaluation and Treatment: Rehabilitation  ONSET DATE: 07/08/24 DOS  SUBJECTIVE:    SUBJECTIVE STATEMENT: Pt reports today he started to experience an increase in pain with his L hip.  PERTINENT HISTORY: High BMI, smokes cigarettes, arthritic L knee  PAIN:  Are you having pain? Yes: NPRS scale: 5/10 Pain location: L ant/lat hip Pain description: ache, throb, sharp Aggravating factors: moving around, when pain med wears off Relieving factors: Finding the right position  PRECAUTIONS: None  RED FLAGS: None   WEIGHT BEARING RESTRICTIONS: No WBAT  FALLS:  Has patient fallen in last 6 months? No  LIVING ENVIRONMENT: Lives with: lives with their family Lives in: House/apartment Stairs: Yes: Internal: 14 steps; on left going up and External: 0 steps; NA Has following equipment at home: Walker - 2 wheeled  OCCUPATION: Heavy arboriculturist  PLOF: Independent  PATIENT GOALS: To walk and move around as best as I can.  NEXT MD VISIT: 07/23/24  OBJECTIVE:  Note: Objective measures were completed at Evaluation unless otherwise noted.  PATIENT SURVEYS:  LEFS:0/80=0%  COGNITION: Overall cognitive status: Within functional limits for tasks assessed     SENSATION: WFL  EDEMA:  L ant/lateral hip  MUSCLE LENGTH: Hamstrings: Right NT deg; Left NT deg Debby test: Right NT deg; Left NT deg  POSTURE: anterior pelvic tilt  PALPATION: TTP to the peri ant/lat L hip  LOWER EXTREMITY ROM:  Active ROM Right eval Left eval  Hip flexion 40   Hip extension 15 lacking   Hip abduction    Hip adduction  Hip internal rotation    Hip external rotation    Knee flexion    Knee extension    Ankle dorsiflexion    Ankle plantarflexion    Ankle inversion    Ankle eversion     (Blank rows = not tested)  LOWER EXTREMITY MMT MMT Right eval Left eval  Hip flexion 2 pain   Hip extension 2 pain   Hip abduction 2 pain   Hip adduction    Hip internal rotation    Hip external rotation 2pain   Knee flexion    Knee extension    Ankle dorsiflexion     Ankle plantarflexion    Ankle inversion    Ankle eversion     (Blank rows = not tested)  FUNCTIONAL TESTS:  5 times sit to stand: Assess when pt is able to walk s an AD 2 minute walk test: Assess when pt is able to walk s an AD  GAIT: Distance walked: 200' Assistive device utilized: Environmental Consultant - 2 wheeled Level of assistance: Modified independence Comments: Walks c a heel to toe gait pattern                                                                                                             TREATMENT DATE:   [facility] Adult PT Treatment  07/24/2024:  Therapeutic Exercise: nu-step L5 3.3m - stopped d/t knee pain LTR - 20x Supine hip adduction - 2x10x5s Supine hip ER - alternating - GTB - 2x10 ea Heel slides - stopped d/t knee pain Supine march - GTB assist - 3x8 Small arc bridge on ball - 2x5 R SKTC - for L hip flexor stretch DKTC on pball - 2x20  Manual Therapy  Gentle PROM with oscillations for pain control    [facility] Adult PT Treatment:                                                DATE: 07/11/24 Therapeutic Exercise: Developed, instructed in, and pt completed therex as noted in HEP  Self Care: Use of cold pack and elevation peridically during the day to swelling and pain management Eval findings and purpose of PT  HOME EXERCISE PROGRAM: Access Code: [email or web address] URL: [email or web address] Date: 07/11/2024 Prepared by: Justin Moore  Exercises - Ankle Pumps with Compression Garment  - 1 x daily - 7 x weekly - 2 sets - 10 reps - Supine Gluteal Sets  - 1 x daily - 7 x weekly - 1-2 sets - 10 reps - 5 hold - Supine Quad Set  - 2 x daily - 7 x weekly - 1-2 sets - 10 reps - 5 hold - Supine Heel Slides  - 1 x daily - 7 x weekly - 1-2 sets - 10 reps - 5 hold - Supine Knee Extension Strengthening  - 1 x daily - 7 x weekly - 1-2 sets - 10 reps - 3  hold - Active Straight Leg Raise with Quad Set (Mirrored)  - 1 x daily - 7 x weekly - 1-2 sets - 10 reps - 3 hold -  Supine Hip Abduction  - 1 x daily - 7 x weekly - 1-2 sets - 10 reps - 3 hold  ASSESSMENT:  CLINICAL IMPRESSION:  Justin Moore tolerated session well with no adverse reaction.  Concentrated on gentle ROM and strengthening.  Pt reports minimal increases in transient pain with hip focused strengthening and was able to achieve hip ext to ~5 degrees.  L knee is more limiting in many cases and exercises were modified of d/c'd accordingly.   EVAL: Patient is a 58 y.o. male who was seen today for physical therapy evaluation and treatment for M16.12 (ICD-10-CM) - Primary osteoarthritis of left hip. Pt presents with signs and symptoms consistent with undergoing a recent L THA. Pt reports he begin to experience an increase in pain today, and due to pain, active movements of the L hip were limited. Pt did demonstrate a good heel to toe pattern with gait using a RW. Pt will benefit from skilled PT 2w8 to address impairments to optimize L hip/LE function with less pain.   OBJECTIVE IMPAIRMENTS: decreased activity tolerance, decreased balance, difficulty walking, decreased ROM, decreased strength, pain, and high BMI.   ACTIVITY LIMITATIONS: carrying, lifting, bending, sitting, standing, squatting, sleeping, stairs, transfers, bed mobility, bathing, toileting, dressing, hygiene/grooming, locomotion level, and caring for others  PARTICIPATION LIMITATIONS: meal prep, cleaning, laundry, driving, shopping, community activity, and occupation  PERSONAL FACTORS: Past/current experiences, Time since onset of injury/illness/exacerbation, and 1-2 comorbidities: High BMI, smokes cigarettes are also affecting patient's functional outcome.   REHAB POTENTIAL: Good  CLINICAL DECISION MAKING: Evolving/moderate complexity  EVALUATION COMPLEXITY: Moderate   GOALS:  SHORT TERM GOALS: Target date: 07/26/24 Pt will be Ind in an initial HEP  Baseline:started Goal status: MET  2.  Increase L hip AROM to 5-75d to progress  functional ROM of the L hip  Baseline: 15-40 12/31: 5-90 Goal status: MET  LONG TERM GOALS: Target date: 09/14/23  Pt will be Ind in a final HEP to maintain achieved LOF  Baseline: started Goal status: INITIAL  2.  Increase L hip AROM to 0-100d to progress functional use L hip/LE Baseline: 15-40 Goal status: INITIAL  3.  Increased L hip strength to 4/5 to progress functional use of the L hip/LE Baseline:  Goal status: INITIAL  4.  Improve 5xSTS by MCID of 5 and by MCID of 40ft as indication of improved functional mobility  Baseline: Assess when pt is able to walk s an AD Goal status: INITIAL  5.  Pt will progress to Ind amb of 500' for community ambulation and for mod Ind to asc/dsc 12 step c alt pattern c HR Baseline: Mod ind c RW, mod ind c HR for steps step to pattern  Goal status: INITIAL  6.  Pt's LEFS score will improve to 50% or greater as indication of improved function  Baseline: 0% Goal status: INITIAL   PLAN:  PT FREQUENCY: 2x/week  PT DURATION: 8 weeks  PLANNED INTERVENTIONS: 97164- PT Re-evaluation, 97110-Therapeutic exercises, 97530- Therapeutic activity, 97112- Neuromuscular re-education, 97535- Self Care, [id number]- Manual therapy, [phone]- Gait training, Patient/Family education, Balance training, Stair training, Taping, Joint mobilization, Cryotherapy, and Moist heat  PLAN FOR NEXT SESSION: Assess response to HEP; progress therex as indicated; use of modalities, manual therapy as indicated.    Bufford Helms E Lakiya Cottam PT 07/24/2024 8:44 AM  For all possible CPT codes, reference the Planned Interventions line above.     Check all conditions that are expected to impact treatment: {Conditions expected to impact treatment:Musculoskeletal disorders   If treatment provided at initial evaluation, no treatment charged due to lack of authorization.        "

## 2024-07-30 ENCOUNTER — Ambulatory Visit: Payer: MEDICAID | Attending: Orthopaedic Surgery | Admitting: Physical Therapy

## 2024-07-30 ENCOUNTER — Encounter: Payer: Self-pay | Admitting: Physical Therapy

## 2024-07-30 DIAGNOSIS — M6281 Muscle weakness (generalized): Secondary | ICD-10-CM | POA: Insufficient documentation

## 2024-07-30 DIAGNOSIS — M25552 Pain in left hip: Secondary | ICD-10-CM | POA: Insufficient documentation

## 2024-07-30 DIAGNOSIS — R262 Difficulty in walking, not elsewhere classified: Secondary | ICD-10-CM | POA: Diagnosis present

## 2024-07-30 DIAGNOSIS — R600 Localized edema: Secondary | ICD-10-CM | POA: Insufficient documentation

## 2024-07-30 NOTE — Therapy (Unsigned)
 " OUTPATIENT PHYSICAL THERAPY DAILY NOTE   Patient Name: Justin Moore MRN: [id number] DOB:[birth year], 59 y.o., male Today's Date: 07/31/2024  END OF SESSION:  PT End of Session - 07/30/24 1556     Visit Number 3    Number of Visits 17    Date for Recertification  09/13/24    Authorization Type [employer]    PT Start Time 0355    PT Stop Time 0433    PT Time Calculation (min) 38 min    Activity Tolerance Patient tolerated treatment well    Behavior During Therapy [facility] for tasks assessed/performed          Past Medical History:  Diagnosis Date   Arthritis    Hypertension    Past Surgical History:  Procedure Laterality Date   TOTAL HIP ARTHROPLASTY Left 07/08/2024   Procedure: ARTHROPLASTY, HIP, TOTAL, ANTERIOR APPROACH;  Surgeon: Justin Kay HERO, MD;  Location: [facility] OR;  Service: Orthopedics;  Laterality: Left;  3-C   Patient Active Problem List   Diagnosis Date Noted   Status post total replacement of left hip 07/08/2024   Primary osteoarthritis of left hip 06/11/2024   Pain of left hip 05/27/2024   Mixed hyperlipidemia 05/24/2024   Alcohol withdrawal delirium, acute, hyperactive (HCC) 12/26/2021   Thrombocytopenia 12/26/2021   Hypokalemia 12/26/2021   Hypertensive emergency 12/26/2021   Alcohol withdrawal (HCC) 12/26/2021   Alcoholic hepatitis without ascites (HCC) 05/20/2021   Alcohol use disorder 05/20/2021   Smoking 12/05/2019   Elevated lipids 12/05/2019   Leukocytosis 12/05/2019   Encounter to establish care 11/28/2019   Essential hypertension 11/28/2019    PCP: Justin Lucas Fox, PA-C  REFERRING PROVIDER: [name], MD   REFERRING DIAG: [phone] (ICD-10-CM) - Primary osteoarthritis of left hip   THERAPY DIAG:  Pain in left hip  Muscle weakness (generalized)  Localized edema  Difficulty in walking, not elsewhere classified  Rationale for Evaluation and Treatment: Rehabilitation  ONSET DATE: 07/08/24 DOS  SUBJECTIVE:    SUBJECTIVE STATEMENT: Pt reports today he started to experience an increase in pain with his L hip.  PERTINENT HISTORY: High BMI, smokes cigarettes, arthritic L knee  PAIN:  Are you having pain? Yes: NPRS scale: 5/10 Pain location: L ant/lat hip Pain description: ache, throb, sharp Aggravating factors: moving around, when pain med wears off Relieving factors: Finding the right position  PRECAUTIONS: None  RED FLAGS: None   WEIGHT BEARING RESTRICTIONS: No WBAT  FALLS:  Has patient fallen in last 6 months? No  LIVING ENVIRONMENT: Lives with: lives with their family Lives in: House/apartment Stairs: Yes: Internal: 14 steps; on left going up and External: 0 steps; NA Has following equipment at home: Walker - 2 wheeled  OCCUPATION: Heavy arboriculturist  PLOF: Independent  PATIENT GOALS: To walk and move around as best as I can.  NEXT MD VISIT: 07/23/24  OBJECTIVE:  Note: Objective measures were completed at Evaluation unless otherwise noted.  PATIENT SURVEYS:  LEFS:0/80=0%  COGNITION: Overall cognitive status: Within functional limits for tasks assessed     SENSATION: WFL  EDEMA:  L ant/lateral hip  MUSCLE LENGTH: Hamstrings: Right NT deg; Left NT deg Debby test: Right NT deg; Left NT deg  POSTURE: anterior pelvic tilt  PALPATION: TTP to the peri ant/lat L hip  LOWER EXTREMITY ROM:  Active ROM Right eval Left eval  Hip flexion 40   Hip extension 15 lacking   Hip abduction    Hip adduction  Hip internal rotation    Hip external rotation    Knee flexion    Knee extension    Ankle dorsiflexion    Ankle plantarflexion    Ankle inversion    Ankle eversion     (Blank rows = not tested)  LOWER EXTREMITY MMT MMT Right eval Left eval  Hip flexion 2 pain   Hip extension 2 pain   Hip abduction 2 pain   Hip adduction    Hip internal rotation    Hip external rotation 2pain   Knee flexion    Knee extension    Ankle dorsiflexion     Ankle plantarflexion    Ankle inversion    Ankle eversion     (Blank rows = not tested)  FUNCTIONAL TESTS:  5 times sit to stand: Assess when pt is able to walk s an AD 2 minute walk test: Assess when pt is able to walk s an AD  GAIT: Distance walked: 200' Assistive device utilized: Environmental Consultant - 2 wheeled Level of assistance: Modified independence Comments: Walks c a heel to toe gait pattern                                                                                                             TREATMENT DATE:   [facility] Adult PT Treatment  07/31/2023:  Therapeutic Exercise: nu-step L2 37m - stopped d/t knee pain LTR - 20x Supine hip adduction pilates ring - 2x8x3s Supine hip ER - Blue TB - 2x8x3s Supine march - 3x8 Small arc bridge on ball - 2x8 L knee quad set with towel under knee - 2x8x3s  Manual Therapy  Gentle PROM with oscillations for pain control    [facility] Adult PT Treatment:                                                DATE: 07/11/24 Therapeutic Exercise: Developed, instructed in, and pt completed therex as noted in HEP  Self Care: Use of cold pack and elevation peridically during the day to swelling and pain management Eval findings and purpose of PT  HOME EXERCISE PROGRAM: Access Code: [email or web address] URL: [email or web address] Date: 07/11/2024 Prepared by: Justin Moore  Exercises - Ankle Pumps with Compression Garment  - 1 x daily - 7 x weekly - 2 sets - 10 reps - Supine Gluteal Sets  - 1 x daily - 7 x weekly - 1-2 sets - 10 reps - 5 hold - Supine Quad Set  - 2 x daily - 7 x weekly - 1-2 sets - 10 reps - 5 hold - Supine Heel Slides  - 1 x daily - 7 x weekly - 1-2 sets - 10 reps - 5 hold - Supine Knee Extension Strengthening  - 1 x daily - 7 x weekly - 1-2 sets - 10 reps - 3 hold - Active Straight Leg Raise with Quad Set (Mirrored)  - 1  x daily - 7 x weekly - 1-2 sets - 10 reps - 3 hold - Supine Hip Abduction  - 1 x daily - 7 x weekly - 1-2 sets - 10  reps - 3 hold  ASSESSMENT:  CLINICAL IMPRESSION:  Justin Moore tolerated session well with no adverse reaction.  Significant improvement from last visit with improved activity tolerance to increased load.  L knee pain is more limiting than L him in many instances.  Will continue to progress as able.  EVAL: Patient is a 59 y.o. male who was seen today for physical therapy evaluation and treatment for M16.12 (ICD-10-CM) - Primary osteoarthritis of left hip. Pt presents with signs and symptoms consistent with undergoing a recent L THA. Pt reports he begin to experience an increase in pain today, and due to pain, active movements of the L hip were limited. Pt did demonstrate a good heel to toe pattern with gait using a RW. Pt will benefit from skilled PT 2w8 to address impairments to optimize L hip/LE function with less pain.   OBJECTIVE IMPAIRMENTS: decreased activity tolerance, decreased balance, difficulty walking, decreased ROM, decreased strength, pain, and high BMI.   ACTIVITY LIMITATIONS: carrying, lifting, bending, sitting, standing, squatting, sleeping, stairs, transfers, bed mobility, bathing, toileting, dressing, hygiene/grooming, locomotion level, and caring for others  PARTICIPATION LIMITATIONS: meal prep, cleaning, laundry, driving, shopping, community activity, and occupation  PERSONAL FACTORS: Past/current experiences, Time since onset of injury/illness/exacerbation, and 1-2 comorbidities: High BMI, smokes cigarettes are also affecting patient's functional outcome.   REHAB POTENTIAL: Good  CLINICAL DECISION MAKING: Evolving/moderate complexity  EVALUATION COMPLEXITY: Moderate   GOALS:  SHORT TERM GOALS: Target date: 07/26/24 Pt will be Ind in an initial HEP  Baseline:started Goal status: MET  2.  Increase L hip AROM to 5-75d to progress functional ROM of the L hip  Baseline: 15-40 12/31: 5-90 Goal status: MET  LONG TERM GOALS: Target date: 09/14/23  Pt will be Ind in a  final HEP to maintain achieved LOF  Baseline: started Goal status: INITIAL  2.  Increase L hip AROM to 0-100d to progress functional use L hip/LE Baseline: 15-40 Goal status: INITIAL  3.  Increased L hip strength to 4/5 to progress functional use of the L hip/LE Baseline:  Goal status: INITIAL  4.  Improve 5xSTS by MCID of 5 and by MCID of 75ft as indication of improved functional mobility  Baseline: Assess when pt is able to walk s an AD Goal status: INITIAL  5.  Pt will progress to Ind amb of 500' for community ambulation and for mod Ind to asc/dsc 12 step c alt pattern c HR Baseline: Mod ind c RW, mod ind c HR for steps step to pattern  Goal status: INITIAL  6.  Pt's LEFS score will improve to 50% or greater as indication of improved function  Baseline: 0% Goal status: INITIAL   PLAN:  PT FREQUENCY: 2x/week  PT DURATION: 8 weeks  PLANNED INTERVENTIONS: 97164- PT Re-evaluation, 97110-Therapeutic exercises, 97530- Therapeutic activity, 97112- Neuromuscular re-education, 97535- Self Care, 02859- Manual therapy, [phone]- Gait training, Patient/Family education, Balance training, Stair training, Taping, Joint mobilization, Cryotherapy, and Moist heat  PLAN FOR NEXT SESSION: Assess response to HEP; progress therex as indicated; use of modalities, manual therapy as indicated.    Helene BRAVO Galileo Colello PT 07/31/2024 9:21 AM   For all possible CPT codes, reference the Planned Interventions line above.     Check all conditions that are expected to impact treatment: {  Conditions expected to impact treatment:Musculoskeletal disorders   If treatment provided at initial evaluation, no treatment charged due to lack of authorization.        "

## 2024-08-01 ENCOUNTER — Encounter: Payer: Self-pay | Admitting: Physical Therapy

## 2024-08-01 ENCOUNTER — Ambulatory Visit: Payer: MEDICAID | Admitting: Physical Therapy

## 2024-08-01 DIAGNOSIS — M25552 Pain in left hip: Secondary | ICD-10-CM

## 2024-08-01 DIAGNOSIS — M6281 Muscle weakness (generalized): Secondary | ICD-10-CM

## 2024-08-01 DIAGNOSIS — R262 Difficulty in walking, not elsewhere classified: Secondary | ICD-10-CM

## 2024-08-01 DIAGNOSIS — R600 Localized edema: Secondary | ICD-10-CM

## 2024-08-01 NOTE — Therapy (Signed)
 " OUTPATIENT PHYSICAL THERAPY DAILY NOTE   Patient Name: Justin Moore MRN: [id number] DOB:24-Jun-1966, 59 y.o., male Today's Date: 08/01/2024  END OF SESSION:  PT End of Session - 08/01/24 1335     Visit Number 4    Number of Visits 17    Date for Recertification  09/13/24    Authorization Type [employer]    PT Start Time 1335    PT Stop Time 1414    PT Time Calculation (min) 39 min    Activity Tolerance Patient tolerated treatment well    Behavior During Therapy WFL for tasks assessed/performed          Past Medical History:  Diagnosis Date   Arthritis    Hypertension    Past Surgical History:  Procedure Laterality Date   TOTAL HIP ARTHROPLASTY Left 07/08/2024   Procedure: ARTHROPLASTY, HIP, TOTAL, ANTERIOR APPROACH;  Surgeon: [name], MD;  Location: [facility] OR;  Service: Orthopedics;  Laterality: Left;  3-C   Patient Active Problem List   Diagnosis Date Noted   Status post total replacement of left hip 07/08/2024   Primary osteoarthritis of left hip 06/11/2024   Pain of left hip 05/27/2024   Mixed hyperlipidemia 05/24/2024   Alcohol withdrawal delirium, acute, hyperactive (HCC) 12/26/2021   Thrombocytopenia 12/26/2021   Hypokalemia 12/26/2021   Hypertensive emergency 12/26/2021   Alcohol withdrawal (HCC) 12/26/2021   Alcoholic hepatitis without ascites (HCC) 05/20/2021   Alcohol use disorder 05/20/2021   Smoking 12/05/2019   Elevated lipids 12/05/2019   Leukocytosis 12/05/2019   Encounter to establish care 11/28/2019   Essential hypertension 11/28/2019    PCP: [name], PA-C  REFERRING PROVIDER: [name], MD   REFERRING DIAG: [phone] (ICD-10-CM) - Primary osteoarthritis of left hip   THERAPY DIAG:  Pain in left hip  Muscle weakness (generalized)  Localized edema  Difficulty in walking, not elsewhere classified  Rationale for Evaluation and Treatment: Rehabilitation  ONSET DATE: 07/08/24 DOS  SUBJECTIVE:    SUBJECTIVE STATEMENT: Pt reports he is doing well and seeing improvement in his hip pain.  PERTINENT HISTORY: High BMI, smokes cigarettes, arthritic L knee  PAIN:  Are you having pain? Yes: NPRS scale: 4/10 Pain location: L ant/lat hip Pain description: ache, throb, sharp Aggravating factors: moving around, when pain med wears off Relieving factors: Finding the right position  PRECAUTIONS: None  RED FLAGS: None   WEIGHT BEARING RESTRICTIONS: No WBAT  FALLS:  Has patient fallen in last 6 months? No  LIVING ENVIRONMENT: Lives with: lives with their family Lives in: House/apartment Stairs: Yes: Internal: 14 steps; on left going up and External: 0 steps; NA Has following equipment at home: Walker - 2 wheeled  OCCUPATION: Heavy arboriculturist  PLOF: Independent  PATIENT GOALS: To walk and move around as best as I can.  NEXT MD VISIT: 07/23/24  OBJECTIVE:  Note: Objective measures were completed at Evaluation unless otherwise noted.  PATIENT SURVEYS:  LEFS:0/80=0%  COGNITION: Overall cognitive status: Within functional limits for tasks assessed     SENSATION: WFL  EDEMA:  L ant/lateral hip  MUSCLE LENGTH: Hamstrings: Right NT deg; Left NT deg Debby test: Right NT deg; Left NT deg  POSTURE: anterior pelvic tilt  PALPATION: TTP to the peri ant/lat L hip  LOWER EXTREMITY ROM:  Active ROM Right eval Left eval  Hip flexion 40   Hip extension 15 lacking   Hip abduction    Hip adduction    Hip  internal rotation    Hip external rotation    Knee flexion    Knee extension    Ankle dorsiflexion    Ankle plantarflexion    Ankle inversion    Ankle eversion     (Blank rows = not tested)  LOWER EXTREMITY MMT MMT Right eval Left eval  Hip flexion 2 pain   Hip extension 2 pain   Hip abduction 2 pain   Hip adduction    Hip internal rotation    Hip external rotation 2pain   Knee flexion    Knee extension    Ankle dorsiflexion    Ankle  plantarflexion    Ankle inversion    Ankle eversion     (Blank rows = not tested)  FUNCTIONAL TESTS:  5 times sit to stand: Assess when pt is able to walk s an AD 2 minute walk test: Assess when pt is able to walk s an AD  GAIT: Distance walked: 200' Assistive device utilized: Environmental Consultant - 2 wheeled Level of assistance: Modified independence Comments: Walks c a heel to toe gait pattern                                                                                                             TREATMENT DATE:   [facility] Adult PT Treatment  08/02/2023:  Therapeutic Exercise: nu-step L2 1m - stopped d/t knee pain LTR - 20x Supine hip adduction pilates ring - 2x8x3s S/L hip ER - Yellow TB - 2x8x3s Supine march - 2x8 SLR - R - 2x5 - bent d/t knee pain L bridge on ball - 3x8x3s SL hip abd - 2x4 L knee quad set with towel under knee - 2x8x3s Standing hip flexor stretch  Therapeutic Activity  STS - 2x4 - raised table  Manual Therapy  Gentle PROM with oscillations for pain control  Consider: hip flexor strech, standing hip strengthening     [facility] Adult PT Treatment:                                                DATE: 07/11/24 Therapeutic Exercise: Developed, instructed in, and pt completed therex as noted in HEP  Self Care: Use of cold pack and elevation peridically during the day to swelling and pain management Eval findings and purpose of PT  HOME EXERCISE PROGRAM: Access Code: [email or web address] URL: [email or web address] Date: 07/11/2024 Prepared by: [name]  Exercises - Ankle Pumps with Compression Garment  - 1 x daily - 7 x weekly - 2 sets - 10 reps - Supine Gluteal Sets  - 1 x daily - 7 x weekly - 1-2 sets - 10 reps - 5 hold - Supine Quad Set  - 2 x daily - 7 x weekly - 1-2 sets - 10 reps - 5 hold - Supine Heel Slides  - 1 x daily - 7 x weekly - 1-2 sets - 10 reps - 5  hold - Supine Knee Extension Strengthening  - 1 x daily - 7 x weekly - 1-2 sets - 10 reps - 3  hold - Active Straight Leg Raise with Quad Set (Mirrored)  - 1 x daily - 7 x weekly - 1-2 sets - 10 reps - 3 hold - Supine Hip Abduction  - 1 x daily - 7 x weekly - 1-2 sets - 10 reps - 3 hold  ASSESSMENT:  CLINICAL IMPRESSION:  [name] tolerated session well with no adverse reaction.  Continues to show improved tolerance to both active and passive ROM.  Added in standing hip flexor stretch with good tolerance.  Will progress to higher volume of standing exercises as tolerated.  EVAL: Patient is a 59 y.o. male who was seen today for physical therapy evaluation and treatment for M16.12 (ICD-10-CM) - Primary osteoarthritis of left hip. Pt presents with signs and symptoms consistent with undergoing a recent L THA. Pt reports he begin to experience an increase in pain today, and due to pain, active movements of the L hip were limited. Pt did demonstrate a good heel to toe pattern with gait using a RW. Pt will benefit from skilled PT 2w8 to address impairments to optimize L hip/LE function with less pain.   OBJECTIVE IMPAIRMENTS: decreased activity tolerance, decreased balance, difficulty walking, decreased ROM, decreased strength, pain, and high BMI.   ACTIVITY LIMITATIONS: carrying, lifting, bending, sitting, standing, squatting, sleeping, stairs, transfers, bed mobility, bathing, toileting, dressing, hygiene/grooming, locomotion level, and caring for others  PARTICIPATION LIMITATIONS: meal prep, cleaning, laundry, driving, shopping, community activity, and occupation  PERSONAL FACTORS: Past/current experiences, Time since onset of injury/illness/exacerbation, and 1-2 comorbidities: High BMI, smokes cigarettes are also affecting patient's functional outcome.   REHAB POTENTIAL: Good  CLINICAL DECISION MAKING: Evolving/moderate complexity  EVALUATION COMPLEXITY: Moderate   GOALS:  SHORT TERM GOALS: Target date: 07/26/24 Pt will be Ind in an initial HEP  Baseline:started Goal status:  MET  2.  Increase L hip AROM to 5-75d to progress functional ROM of the L hip  Baseline: 15-40 12/31: 5-90 Goal status: MET  LONG TERM GOALS: Target date: 09/14/23  Pt will be Ind in a final HEP to maintain achieved LOF  Baseline: started Goal status: INITIAL  2.  Increase L hip AROM to 0-100d to progress functional use L hip/LE Baseline: 15-40 Goal status: INITIAL  3.  Increased L hip strength to 4/5 to progress functional use of the L hip/LE Baseline:  Goal status: INITIAL  4.  Improve 5xSTS by MCID of 5 and by MCID of 83ft as indication of improved functional mobility  Baseline: Assess when pt is able to walk s an AD Goal status: INITIAL  5.  Pt will progress to Ind amb of 500' for community ambulation and for mod Ind to asc/dsc 12 step c alt pattern c HR Baseline: Mod ind c RW, mod ind c HR for steps step to pattern  Goal status: INITIAL  6.  Pt's LEFS score will improve to 50% or greater as indication of improved function  Baseline: 0% Goal status: INITIAL   PLAN:  PT FREQUENCY: 2x/week  PT DURATION: 8 weeks  PLANNED INTERVENTIONS: 97164- PT Re-evaluation, 97110-Therapeutic exercises, 97530- Therapeutic activity, 97112- Neuromuscular re-education, 97535- Self Care, [id number]- Manual therapy, [phone]- Gait training, Patient/Family education, Balance training, Stair training, Taping, Joint mobilization, Cryotherapy, and Moist heat  PLAN FOR NEXT SESSION: Assess response to HEP; progress therex as indicated; use of modalities, manual therapy as indicated.  Salwa Bai E Chaley Castellanos PT 08/01/2024 2:21 PM   For all possible CPT codes, reference the Planned Interventions line above.     Check all conditions that are expected to impact treatment: {Conditions expected to impact treatment:Musculoskeletal disorders   If treatment provided at initial evaluation, no treatment charged due to lack of authorization.        "

## 2024-08-05 NOTE — Therapy (Incomplete)
 " OUTPATIENT PHYSICAL THERAPY DAILY NOTE   Patient Name: Justin Moore MRN: [id number] DOB:[birth year], 59 y.o., male Today's Date: 08/05/2024  END OF SESSION:    Past Medical History:  Diagnosis Date   Arthritis    Hypertension    Past Surgical History:  Procedure Laterality Date   TOTAL HIP ARTHROPLASTY Left 07/08/2024   Procedure: ARTHROPLASTY, HIP, TOTAL, ANTERIOR APPROACH;  Surgeon: [name], MD;  Location: [facility] OR;  Service: Orthopedics;  Laterality: Left;  3-C   Patient Active Problem List   Diagnosis Date Noted   Status post total replacement of left hip 07/08/2024   Primary osteoarthritis of left hip 06/11/2024   Pain of left hip 05/27/2024   Mixed hyperlipidemia 05/24/2024   Alcohol withdrawal delirium, acute, hyperactive (HCC) 12/26/2021   Thrombocytopenia 12/26/2021   Hypokalemia 12/26/2021   Hypertensive emergency 12/26/2021   Alcohol withdrawal (HCC) 12/26/2021   Alcoholic hepatitis without ascites (HCC) 05/20/2021   Alcohol use disorder 05/20/2021   Smoking 12/05/2019   Elevated lipids 12/05/2019   Leukocytosis 12/05/2019   Encounter to establish care 11/28/2019   Essential hypertension 11/28/2019    PCP: [name], PA-C  REFERRING PROVIDER: [name], MD   REFERRING DIAG: [phone] (ICD-10-CM) - Primary osteoarthritis of left hip   THERAPY DIAG:  No diagnosis found.  Rationale for Evaluation and Treatment: Rehabilitation  ONSET DATE: 07/08/24 DOS  SUBJECTIVE:   SUBJECTIVE STATEMENT: Pt reports he is doing well and seeing improvement in his hip pain.  PERTINENT HISTORY: High BMI, smokes cigarettes, arthritic L knee  PAIN:  Are you having pain? Yes: NPRS scale: 4/10 Pain location: L ant/lat hip Pain description: ache, throb, sharp Aggravating factors: moving around, when pain med wears off Relieving factors: Finding the right position  PRECAUTIONS: None  RED FLAGS: None   WEIGHT BEARING RESTRICTIONS: No  WBAT  FALLS:  Has patient fallen in last 6 months? No  LIVING ENVIRONMENT: Lives with: lives with their family Lives in: House/apartment Stairs: Yes: Internal: 14 steps; on left going up and External: 0 steps; NA Has following equipment at home: Walker - 2 wheeled  OCCUPATION: Heavy arboriculturist  PLOF: Independent  PATIENT GOALS: To walk and move around as best as I can.  NEXT MD VISIT: 07/23/24  OBJECTIVE:  Note: Objective measures were completed at Evaluation unless otherwise noted.  PATIENT SURVEYS:  LEFS:0/80=0%  COGNITION: Overall cognitive status: Within functional limits for tasks assessed     SENSATION: WFL  EDEMA:  L ant/lateral hip  MUSCLE LENGTH: Hamstrings: Right NT deg; Left NT deg Debby test: Right NT deg; Left NT deg  POSTURE: anterior pelvic tilt  PALPATION: TTP to the peri ant/lat L hip  LOWER EXTREMITY ROM:  Active ROM Right eval Left eval  Hip flexion 40   Hip extension 15 lacking   Hip abduction    Hip adduction    Hip internal rotation    Hip external rotation    Knee flexion    Knee extension    Ankle dorsiflexion    Ankle plantarflexion    Ankle inversion    Ankle eversion     (Blank rows = not tested)  LOWER EXTREMITY MMT MMT Right eval Left eval  Hip flexion 2 pain   Hip extension 2 pain   Hip abduction 2 pain   Hip adduction    Hip internal rotation    Hip external rotation 2pain   Knee flexion    Knee extension  Ankle dorsiflexion    Ankle plantarflexion    Ankle inversion    Ankle eversion     (Blank rows = not tested)  FUNCTIONAL TESTS:  5 times sit to stand: Assess when pt is able to walk s an AD 2 minute walk test: Assess when pt is able to walk s an AD  GAIT: Distance walked: 200' Assistive device utilized: Environmental Consultant - 2 wheeled Level of assistance: Modified independence Comments: Walks c a heel to toe gait pattern                                                                                                              TREATMENT DATE:  [facility] Adult PT Treatment:                                                DATE: 08/06/24 Therapeutic Exercise: nu-step L2 20m - stopped d/t knee pain LTR - 20x Supine hip adduction pilates ring - 2x8x3s S/L hip ER - Yellow TB - 2x8x3s Supine march - 2x8 SLR - R - 2x5 - bent d/t knee pain L bridge on ball - 3x8x3s SL hip abd - 2x4 L knee quad set with towel under knee - 2x8x3s Standing hip flexor stretch  Therapeutic Activity  STS - 2x4 - raised table  Manual Therapy  Gentle PROM with oscillations for pain control  Consider: hip flexor strech, standing hip strengthening  Therapeutic Exercise: *** Manual Therapy: *** Neuromuscular re-ed: *** Therapeutic Activity: *** Modalities: *** Self Care: ***  [facility] Adult PT Treatment  08/02/2023:  Therapeutic Exercise: nu-step L2 109m - stopped d/t knee pain LTR - 20x Supine hip adduction pilates ring - 2x8x3s S/L hip ER - Yellow TB - 2x8x3s Supine march - 2x8 SLR - R - 2x5 - bent d/t knee pain L bridge on ball - 3x8x3s SL hip abd - 2x4 L knee quad set with towel under knee - 2x8x3s Standing hip flexor stretch  Therapeutic Activity  STS - 2x4 - raised table  Manual Therapy  Gentle PROM with oscillations for pain control  Consider: hip flexor strech, standing hip strengthening     [facility] Adult PT Treatment:                                                DATE: 07/11/24 Therapeutic Exercise: Developed, instructed in, and pt completed therex as noted in HEP  Self Care: Use of cold pack and elevation peridically during the day to swelling and pain management Eval findings and purpose of PT  HOME EXERCISE PROGRAM: Access Code: [email or web address] URL: [email or web address] Date: 07/11/2024 Prepared by: [name]  Exercises - Ankle Pumps with Compression Garment  - 1 x daily - 7 x weekly - 2 sets - 10 reps -  Supine Gluteal Sets  - 1 x daily - 7 x weekly - 1-2 sets - 10  reps - 5 hold - Supine Quad Set  - 2 x daily - 7 x weekly - 1-2 sets - 10 reps - 5 hold - Supine Heel Slides  - 1 x daily - 7 x weekly - 1-2 sets - 10 reps - 5 hold - Supine Knee Extension Strengthening  - 1 x daily - 7 x weekly - 1-2 sets - 10 reps - 3 hold - Active Straight Leg Raise with Quad Set (Mirrored)  - 1 x daily - 7 x weekly - 1-2 sets - 10 reps - 3 hold - Supine Hip Abduction  - 1 x daily - 7 x weekly - 1-2 sets - 10 reps - 3 hold  ASSESSMENT:  CLINICAL IMPRESSION:  Justin Moore tolerated session well with no adverse reaction.  Continues to show improved tolerance to both active and passive ROM.  Added in standing hip flexor stretch with good tolerance.  Will progress to higher volume of standing exercises as tolerated.  EVAL: Patient is a 59 y.o. male who was seen today for physical therapy evaluation and treatment for M16.12 (ICD-10-CM) - Primary osteoarthritis of left hip. Pt presents with signs and symptoms consistent with undergoing a recent L THA. Pt reports he begin to experience an increase in pain today, and due to pain, active movements of the L hip were limited. Pt did demonstrate a good heel to toe pattern with gait using a RW. Pt will benefit from skilled PT 2w8 to address impairments to optimize L hip/LE function with less pain.   OBJECTIVE IMPAIRMENTS: decreased activity tolerance, decreased balance, difficulty walking, decreased ROM, decreased strength, pain, and high BMI.   ACTIVITY LIMITATIONS: carrying, lifting, bending, sitting, standing, squatting, sleeping, stairs, transfers, bed mobility, bathing, toileting, dressing, hygiene/grooming, locomotion level, and caring for others  PARTICIPATION LIMITATIONS: meal prep, cleaning, laundry, driving, shopping, community activity, and occupation  PERSONAL FACTORS: Past/current experiences, Time since onset of injury/illness/exacerbation, and 1-2 comorbidities: High BMI, smokes cigarettes are also affecting patient's functional  outcome.   REHAB POTENTIAL: Good  CLINICAL DECISION MAKING: Evolving/moderate complexity  EVALUATION COMPLEXITY: Moderate   GOALS:  SHORT TERM GOALS: Target date: 07/26/24 Pt will be Ind in an initial HEP  Baseline:started Goal status: MET  2.  Increase L hip AROM to 5-75d to progress functional ROM of the L hip  Baseline: 15-40 12/31: 5-90 Goal status: MET  LONG TERM GOALS: Target date: 09/14/23  Pt will be Ind in a final HEP to maintain achieved LOF  Baseline: started Goal status: INITIAL  2.  Increase L hip AROM to 0-100d to progress functional use L hip/LE Baseline: 15-40 Goal status: INITIAL  3.  Increased L hip strength to 4/5 to progress functional use of the L hip/LE Baseline:  Goal status: INITIAL  4.  Improve 5xSTS by MCID of 5 and by MCID of 52ft as indication of improved functional mobility  Baseline: Assess when pt is able to walk s an AD Goal status: INITIAL  5.  Pt will progress to Ind amb of 500' for community ambulation and for mod Ind to asc/dsc 12 step c alt pattern c HR Baseline: Mod ind c RW, mod ind c HR for steps step to pattern  Goal status: INITIAL  6.  Pt's LEFS score will improve to 50% or greater as indication of improved function  Baseline: 0% Goal status: INITIAL   PLAN:  PT FREQUENCY: 2x/week  PT DURATION: 8 weeks  PLANNED INTERVENTIONS: 97164- PT Re-evaluation, 97110-Therapeutic exercises, 97530- Therapeutic activity, 97112- Neuromuscular re-education, 97535- Self Care, [id number]- Manual therapy, [phone]- Gait training, Patient/Family education, Balance training, Stair training, Taping, Joint mobilization, Cryotherapy, and Moist heat  PLAN FOR NEXT SESSION: Assess response to HEP; progress therex as indicated; use of modalities, manual therapy as indicated.   Kinslee Dalpe PT 08/05/2024 3:13 PM        "

## 2024-08-06 ENCOUNTER — Ambulatory Visit: Payer: MEDICAID

## 2024-08-08 ENCOUNTER — Ambulatory Visit: Payer: MEDICAID | Admitting: Physical Therapy

## 2024-08-09 ENCOUNTER — Encounter: Payer: Self-pay | Admitting: Physical Therapy

## 2024-08-09 ENCOUNTER — Ambulatory Visit: Payer: MEDICAID | Admitting: Physical Therapy

## 2024-08-09 DIAGNOSIS — M25552 Pain in left hip: Secondary | ICD-10-CM

## 2024-08-09 DIAGNOSIS — M6281 Muscle weakness (generalized): Secondary | ICD-10-CM

## 2024-08-09 NOTE — Therapy (Signed)
 " OUTPATIENT PHYSICAL THERAPY DAILY NOTE   Patient Name: Justin Moore MRN: [id number] DOB:Mar 29, 1966, 59 y.o., male Today's Date: 08/09/2024  END OF SESSION:  PT End of Session - 08/09/24 1234     Visit Number 5    Number of Visits 17    Date for Recertification  09/13/24    Authorization Type [employer]    PT Start Time 1232    PT Stop Time 1302    PT Time Calculation (min) 30 min          Past Medical History:  Diagnosis Date   Arthritis    Hypertension    Past Surgical History:  Procedure Laterality Date   TOTAL HIP ARTHROPLASTY Left 07/08/2024   Procedure: ARTHROPLASTY, HIP, TOTAL, ANTERIOR APPROACH;  Surgeon: [name], MD;  Location: [facility] OR;  Service: Orthopedics;  Laterality: Left;  3-C   Patient Active Problem List   Diagnosis Date Noted   Status post total replacement of left hip 07/08/2024   Primary osteoarthritis of left hip 06/11/2024   Pain of left hip 05/27/2024   Mixed hyperlipidemia 05/24/2024   Alcohol withdrawal delirium, acute, hyperactive (HCC) 12/26/2021   Thrombocytopenia 12/26/2021   Hypokalemia 12/26/2021   Hypertensive emergency 12/26/2021   Alcohol withdrawal (HCC) 12/26/2021   Alcoholic hepatitis without ascites (HCC) 05/20/2021   Alcohol use disorder 05/20/2021   Smoking 12/05/2019   Elevated lipids 12/05/2019   Leukocytosis 12/05/2019   Encounter to establish care 11/28/2019   Essential hypertension 11/28/2019    PCP: [name], PA-C  REFERRING PROVIDER: [name], MD   REFERRING DIAG: [phone] (ICD-10-CM) - Primary osteoarthritis of left hip   THERAPY DIAG:  Pain in left hip  Muscle weakness (generalized)  Rationale for Evaluation and Treatment: Rehabilitation  ONSET DATE: 07/08/24 DOS  SUBJECTIVE:   SUBJECTIVE STATEMENT: Pt reports he is doing well and seeing improvement in his hip pain. Does report continued incision tenderness as swelling.   PERTINENT HISTORY: High BMI, smokes  cigarettes, arthritic L knee  PAIN:  Are you having pain? Yes: NPRS scale: 4/10 Pain location: L ant/lat hip Pain description: ache, throb, sharp Aggravating factors: moving around, when pain med wears off Relieving factors: Finding the right position  PRECAUTIONS: None  RED FLAGS: None   WEIGHT BEARING RESTRICTIONS: No WBAT  FALLS:  Has patient fallen in last 6 months? No  LIVING ENVIRONMENT: Lives with: lives with their family Lives in: House/apartment Stairs: Yes: Internal: 14 steps; on left going up and External: 0 steps; NA Has following equipment at home: Walker - 2 wheeled  OCCUPATION: Heavy arboriculturist  PLOF: Independent  PATIENT GOALS: To walk and move around as best as I can.  NEXT MD VISIT: 07/23/24, 1/26  OBJECTIVE:  Note: Objective measures were completed at Evaluation unless otherwise noted.  PATIENT SURVEYS:  LEFS:0/80=0%  COGNITION: Overall cognitive status: Within functional limits for tasks assessed     SENSATION: WFL  EDEMA:  L ant/lateral hip  MUSCLE LENGTH: Hamstrings: Right NT deg; Left NT deg Debby test: Right NT deg; Left NT deg  POSTURE: anterior pelvic tilt  PALPATION: TTP to the peri ant/lat L hip  LOWER EXTREMITY ROM:  Active ROM Right eval Left eval  Hip flexion 40   Hip extension 15 lacking   Hip abduction    Hip adduction    Hip internal rotation    Hip external rotation    Knee flexion    Knee extension  Ankle dorsiflexion    Ankle plantarflexion    Ankle inversion    Ankle eversion     (Blank rows = not tested)  LOWER EXTREMITY MMT MMT Right eval Left eval  Hip flexion 2 pain   Hip extension 2 pain   Hip abduction 2 pain   Hip adduction    Hip internal rotation    Hip external rotation 2pain   Knee flexion    Knee extension    Ankle dorsiflexion    Ankle plantarflexion    Ankle inversion    Ankle eversion     (Blank rows = not tested)  FUNCTIONAL TESTS:  5 times sit to stand:  Assess when pt is able to walk s an AD 2 minute walk test: Assess when pt is able to walk s an AD  GAIT: Distance walked: 200' Assistive device utilized: Environmental Consultant - 2 wheeled Level of assistance: Modified independence Comments: Walks c a heel to toe gait pattern                                                                                                             TREATMENT DATE:  [facility] Adult PT Treatment:                                                DATE: 08/09/24 Therapeutic Exercise: SLR L 5 x 2  Bridge 10 x 2  SL hip abd - 2x5 S/L hip ER - Red  TB - 2x8x3s Supine hip adduction pilates ring - 2x8x3s LTR Standing hip abdct 8 x 2  Standing hip ext 8 x 2 each  Standing hip flex 8 x 2 each  Hip flexor stretch   Therapeutic Activity: 360 feet without AD  5 x STS without UE  12.8 sec      [facility] Adult PT Treatment  08/02/2023:  Therapeutic Exercise: nu-step L2 44m - stopped d/t knee pain LTR - 20x Supine hip adduction pilates ring - 2x8x3s S/L hip ER - Yellow TB - 2x8x3s Supine march - 2x8 SLR - R - 2x5 - bent d/t knee pain L bridge on ball - 3x8x3s SL hip abd - 2x4 L knee quad set with towel under knee - 2x8x3s Standing hip flexor stretch  Therapeutic Activity  STS - 2x4 - raised table  Manual Therapy  Gentle PROM with oscillations for pain control  Consider: hip flexor strech, standing hip strengthening     [facility] Adult PT Treatment:                                                DATE: 07/11/24 Therapeutic Exercise: Developed, instructed in, and pt completed therex as noted in HEP  Self Care: Use of cold pack and elevation peridically during the day  to swelling and pain management Eval findings and purpose of PT  HOME EXERCISE PROGRAM: Access Code: [email or web address] URL: [email or web address] Date: 07/11/2024 Prepared by: [name]  Exercises - Ankle Pumps with Compression Garment  - 1 x daily - 7 x weekly - 2 sets - 10 reps - Supine Gluteal Sets   - 1 x daily - 7 x weekly - 1-2 sets - 10 reps - 5 hold - Supine Quad Set  - 2 x daily - 7 x weekly - 1-2 sets - 10 reps - 5 hold - Supine Heel Slides  - 1 x daily - 7 x weekly - 1-2 sets - 10 reps - 5 hold - Supine Knee Extension Strengthening  - 1 x daily - 7 x weekly - 1-2 sets - 10 reps - 3 hold - Active Straight Leg Raise with Quad Set (Mirrored)  - 1 x daily - 7 x weekly - 1-2 sets - 10 reps - 3 hold - Supine Hip Abduction  - 1 x daily - 7 x weekly - 1-2 sets - 10 reps - 3 hold  ASSESSMENT:  CLINICAL IMPRESSION:  Aarian tolerated session well with no adverse reaction.  Progressed to standing hip AROM with good tolerance. Has been completing hip flexor stretch regularly. Captured baseline STS and 2 MWT for goals.   EVAL: Patient is a 59 y.o. male who was seen today for physical therapy evaluation and treatment for M16.12 (ICD-10-CM) - Primary osteoarthritis of left hip. Pt presents with signs and symptoms consistent with undergoing a recent L THA. Pt reports he begin to experience an increase in pain today, and due to pain, active movements of the L hip were limited. Pt did demonstrate a good heel to toe pattern with gait using a RW. Pt will benefit from skilled PT 2w8 to address impairments to optimize L hip/LE function with less pain.   OBJECTIVE IMPAIRMENTS: decreased activity tolerance, decreased balance, difficulty walking, decreased ROM, decreased strength, pain, and high BMI.   ACTIVITY LIMITATIONS: carrying, lifting, bending, sitting, standing, squatting, sleeping, stairs, transfers, bed mobility, bathing, toileting, dressing, hygiene/grooming, locomotion level, and caring for others  PARTICIPATION LIMITATIONS: meal prep, cleaning, laundry, driving, shopping, community activity, and occupation  PERSONAL FACTORS: Past/current experiences, Time since onset of injury/illness/exacerbation, and 1-2 comorbidities: High BMI, smokes cigarettes are also affecting patient's functional  outcome.   REHAB POTENTIAL: Good  CLINICAL DECISION MAKING: Evolving/moderate complexity  EVALUATION COMPLEXITY: Moderate   GOALS:  SHORT TERM GOALS: Target date: 07/26/24 Pt will be Ind in an initial HEP  Baseline:started Goal status: MET  2.  Increase L hip AROM to 5-75d to progress functional ROM of the L hip  Baseline: 15-40 12/31: 5-90 Goal status: MET  LONG TERM GOALS: Target date: 09/14/23  Pt will be Ind in a final HEP to maintain achieved LOF  Baseline: started Goal status: INITIAL  2.  Increase L hip AROM to 0-100d to progress functional use L hip/LE Baseline: 15-40 Goal status: INITIAL  3.  Increased L hip strength to 4/5 to progress functional use of the L hip/LE Baseline:  Goal status: INITIAL  4.  Improve 5xSTS by MCID of 5 and by MCID of 78ft as indication of improved functional mobility  Baseline: Assess when pt is able to walk s an AD 08/09/24: 360 feet without AD, 12.8 sec without UE Goal status: ONGOING  5.  Pt will progress to Ind amb of 500' for community ambulation and for mod Ind to asc/dsc 12  step c alt pattern c HR Baseline: Mod ind c RW, mod ind c HR for steps step to pattern  Goal status: INITIAL  6.  Pt's LEFS score will improve to 50% or greater as indication of improved function  Baseline: 0% Goal status: INITIAL   PLAN:  PT FREQUENCY: 2x/week  PT DURATION: 8 weeks  PLANNED INTERVENTIONS: 97164- PT Re-evaluation, 97110-Therapeutic exercises, 97530- Therapeutic activity, 97112- Neuromuscular re-education, 97535- Self Care, [id number]- Manual therapy, [phone]- Gait training, Patient/Family education, Balance training, Stair training, Taping, Joint mobilization, Cryotherapy, and Moist heat  PLAN FOR NEXT SESSION: Assess response to HEP; progress therex as indicated; use of modalities, manual therapy as indicated.    Harlene CHRISTELLA Persons PTA 08/09/24 2:22 PM   For all possible CPT codes, reference the Planned Interventions line above.      Check all conditions that are expected to impact treatment: {Conditions expected to impact treatment:Musculoskeletal disorders   If treatment provided at initial evaluation, no treatment charged due to lack of authorization.        "

## 2024-08-12 NOTE — Therapy (Incomplete)
 " OUTPATIENT PHYSICAL THERAPY DAILY NOTE   Patient Name: Justin Moore MRN: [id number] DOB:05-28-1966, 59 y.o., male Today's Date: 08/12/2024  END OF SESSION:    Past Medical History:  Diagnosis Date   Arthritis    Hypertension    Past Surgical History:  Procedure Laterality Date   TOTAL HIP ARTHROPLASTY Left 07/08/2024   Procedure: ARTHROPLASTY, HIP, TOTAL, ANTERIOR APPROACH;  Surgeon: [name], MD;  Location: [facility] OR;  Service: Orthopedics;  Laterality: Left;  3-C   Patient Active Problem List   Diagnosis Date Noted   Status post total replacement of left hip 07/08/2024   Primary osteoarthritis of left hip 06/11/2024   Pain of left hip 05/27/2024   Mixed hyperlipidemia 05/24/2024   Alcohol withdrawal delirium, acute, hyperactive (HCC) 12/26/2021   Thrombocytopenia 12/26/2021   Hypokalemia 12/26/2021   Hypertensive emergency 12/26/2021   Alcohol withdrawal (HCC) 12/26/2021   Alcoholic hepatitis without ascites (HCC) 05/20/2021   Alcohol use disorder 05/20/2021   Smoking 12/05/2019   Elevated lipids 12/05/2019   Leukocytosis 12/05/2019   Encounter to establish care 11/28/2019   Essential hypertension 11/28/2019    PCP: [name], PA-C  REFERRING PROVIDER: [name], MD   REFERRING DIAG: [phone] (ICD-10-CM) - Primary osteoarthritis of left hip   THERAPY DIAG:  No diagnosis found.  Rationale for Evaluation and Treatment: Rehabilitation  ONSET DATE: 07/08/24 DOS  SUBJECTIVE:   SUBJECTIVE STATEMENT: Pt reports he is doing well and seeing improvement in his hip pain. Does report continued incision tenderness as swelling.   PERTINENT HISTORY: High BMI, smokes cigarettes, arthritic L knee  PAIN:  Are you having pain? Yes: NPRS scale: 4/10 Pain location: L ant/lat hip Pain description: ache, throb, sharp Aggravating factors: moving around, when pain med wears off Relieving factors: Finding the right position  PRECAUTIONS: None  RED  FLAGS: None   WEIGHT BEARING RESTRICTIONS: No WBAT  FALLS:  Has patient fallen in last 6 months? No  LIVING ENVIRONMENT: Lives with: lives with their family Lives in: House/apartment Stairs: Yes: Internal: 14 steps; on left going up and External: 0 steps; NA Has following equipment at home: Walker - 2 wheeled  OCCUPATION: Heavy arboriculturist  PLOF: Independent  PATIENT GOALS: To walk and move around as best as I can.  NEXT MD VISIT: 07/23/24, 1/26  OBJECTIVE:  Note: Objective measures were completed at Evaluation unless otherwise noted.  PATIENT SURVEYS:  LEFS:0/80=0%  COGNITION: Overall cognitive status: Within functional limits for tasks assessed     SENSATION: WFL  EDEMA:  L ant/lateral hip  MUSCLE LENGTH: Hamstrings: Right NT deg; Left NT deg Debby test: Right NT deg; Left NT deg  POSTURE: anterior pelvic tilt  PALPATION: TTP to the peri ant/lat L hip  LOWER EXTREMITY ROM:  Active ROM Right eval Left eval  Hip flexion 40   Hip extension 15 lacking   Hip abduction    Hip adduction    Hip internal rotation    Hip external rotation    Knee flexion    Knee extension    Ankle dorsiflexion    Ankle plantarflexion    Ankle inversion    Ankle eversion     (Blank rows = not tested)  LOWER EXTREMITY MMT MMT Right eval Left eval  Hip flexion 2 pain   Hip extension 2 pain   Hip abduction 2 pain   Hip adduction    Hip internal rotation    Hip external rotation 2pain  Knee flexion    Knee extension    Ankle dorsiflexion    Ankle plantarflexion    Ankle inversion    Ankle eversion     (Blank rows = not tested)  FUNCTIONAL TESTS:  5 times sit to stand: Assess when pt is able to walk s an AD 2 minute walk test: Assess when pt is able to walk s an AD  GAIT: Distance walked: 200' Assistive device utilized: Environmental Consultant - 2 wheeled Level of assistance: Modified independence Comments: Walks c a heel to toe gait pattern                                                                                                              TREATMENT DATE:  [facility] Adult PT Treatment:                                                DATE: 08/13/24 Therapeutic Exercise: SLR L 5 x 2  Bridge 10 x 2  SL hip abd - 2x5 S/L hip ER - Red  TB - 2x8x3s Supine hip adduction pilates ring - 2x8x3s LTR Standing hip abdct 8 x 2  Standing hip ext 8 x 2 each  Standing hip flex 8 x 2 each  Hip flexor stretch   Therapeutic Activity: 360 feet without AD  5 x STS without UE  12.8 sec  Therapeutic Exercise: *** Manual Therapy: *** Neuromuscular re-ed: *** Therapeutic Activity: *** Modalities: *** Self Care: ***  [facility] Adult PT Treatment:                                                DATE: 08/09/24 Therapeutic Exercise: SLR L 5 x 2  Bridge 10 x 2  SL hip abd - 2x5 S/L hip ER - Red  TB - 2x8x3s Supine hip adduction pilates ring - 2x8x3s LTR Standing hip abdct 8 x 2  Standing hip ext 8 x 2 each  Standing hip flex 8 x 2 each  Hip flexor stretch   Therapeutic Activity: 360 feet without AD  5 x STS without UE  12.8 sec      [facility] Adult PT Treatment  08/02/2023:  Therapeutic Exercise: nu-step L2 12m - stopped d/t knee pain LTR - 20x Supine hip adduction pilates ring - 2x8x3s S/L hip ER - Yellow TB - 2x8x3s Supine march - 2x8 SLR - R - 2x5 - bent d/t knee pain L bridge on ball - 3x8x3s SL hip abd - 2x4 L knee quad set with towel under knee - 2x8x3s Standing hip flexor stretch  Therapeutic Activity  STS - 2x4 - raised table  Manual Therapy  Gentle PROM with oscillations for pain control  Consider: hip flexor strech, standing hip strengthening   HOME EXERCISE  PROGRAM: Access Code: [email or web address] URL: [email or web address] Date: 07/11/2024 Prepared by: [name]  Exercises - Ankle Pumps with Compression Garment  - 1 x daily - 7 x weekly - 2 sets - 10 reps - Supine Gluteal Sets  - 1 x daily - 7 x weekly - 1-2 sets - 10  reps - 5 hold - Supine Quad Set  - 2 x daily - 7 x weekly - 1-2 sets - 10 reps - 5 hold - Supine Heel Slides  - 1 x daily - 7 x weekly - 1-2 sets - 10 reps - 5 hold - Supine Knee Extension Strengthening  - 1 x daily - 7 x weekly - 1-2 sets - 10 reps - 3 hold - Active Straight Leg Raise with Quad Set (Mirrored)  - 1 x daily - 7 x weekly - 1-2 sets - 10 reps - 3 hold - Supine Hip Abduction  - 1 x daily - 7 x weekly - 1-2 sets - 10 reps - 3 hold  ASSESSMENT:  CLINICAL IMPRESSION:  Karnell tolerated session well with no adverse reaction.  Progressed to standing hip AROM with good tolerance. Has been completing hip flexor stretch regularly. Captured baseline STS and 2 MWT for goals.   EVAL: Patient is a 59 y.o. male who was seen today for physical therapy evaluation and treatment for M16.12 (ICD-10-CM) - Primary osteoarthritis of left hip. Pt presents with signs and symptoms consistent with undergoing a recent L THA. Pt reports he begin to experience an increase in pain today, and due to pain, active movements of the L hip were limited. Pt did demonstrate a good heel to toe pattern with gait using a RW. Pt will benefit from skilled PT 2w8 to address impairments to optimize L hip/LE function with less pain.   OBJECTIVE IMPAIRMENTS: decreased activity tolerance, decreased balance, difficulty walking, decreased ROM, decreased strength, pain, and high BMI.   ACTIVITY LIMITATIONS: carrying, lifting, bending, sitting, standing, squatting, sleeping, stairs, transfers, bed mobility, bathing, toileting, dressing, hygiene/grooming, locomotion level, and caring for others  PARTICIPATION LIMITATIONS: meal prep, cleaning, laundry, driving, shopping, community activity, and occupation  PERSONAL FACTORS: Past/current experiences, Time since onset of injury/illness/exacerbation, and 1-2 comorbidities: High BMI, smokes cigarettes are also affecting patient's functional outcome.   REHAB POTENTIAL: Good  CLINICAL  DECISION MAKING: Evolving/moderate complexity  EVALUATION COMPLEXITY: Moderate   GOALS:  SHORT TERM GOALS: Target date: 07/26/24 Pt will be Ind in an initial HEP  Baseline:started Goal status: MET  2.  Increase L hip AROM to 5-75d to progress functional ROM of the L hip  Baseline: 15-40 12/31: 5-90 Goal status: MET  LONG TERM GOALS: Target date: 09/14/23  Pt will be Ind in a final HEP to maintain achieved LOF  Baseline: started Goal status: INITIAL  2.  Increase L hip AROM to 0-100d to progress functional use L hip/LE Baseline: 15-40 Goal status: INITIAL  3.  Increased L hip strength to 4/5 to progress functional use of the L hip/LE Baseline:  Goal status: INITIAL  4.  Improve 5xSTS by MCID of 5 and by MCID of 24ft as indication of improved functional mobility  Baseline: Assess when pt is able to walk s an AD 08/09/24: 360 feet without AD, 12.8 sec without UE Goal status: ONGOING  5.  Pt will progress to Ind amb of 500' for community ambulation and for mod Ind to asc/dsc 12 step c alt pattern c HR Baseline: Mod ind c RW, mod ind c  HR for steps step to pattern  Goal status: INITIAL  6.  Pt's LEFS score will improve to 50% or greater as indication of improved function  Baseline: 0% Goal status: INITIAL   PLAN:  PT FREQUENCY: 2x/week  PT DURATION: 8 weeks  PLANNED INTERVENTIONS: 97164- PT Re-evaluation, 97110-Therapeutic exercises, 97530- Therapeutic activity, 97112- Neuromuscular re-education, 97535- Self Care, 02859- Manual therapy, [phone]- Gait training, Patient/Family education, Balance training, Stair training, Taping, Joint mobilization, Cryotherapy, and Moist heat  PLAN FOR NEXT SESSION: Assess response to HEP; progress therex as indicated; use of modalities, manual therapy as indicated.    Ellena Kamen PT 08/12/24 9:50 PM   For all possible CPT codes, reference the Planned Interventions line above.     Check all conditions that are expected to impact  treatment: {Conditions expected to impact treatment:Musculoskeletal disorders   If treatment provided at initial evaluation, no treatment charged due to lack of authorization.        "

## 2024-08-13 ENCOUNTER — Telehealth: Payer: Self-pay

## 2024-08-13 ENCOUNTER — Ambulatory Visit: Payer: MEDICAID

## 2024-08-13 NOTE — Telephone Encounter (Signed)
 LVM re; no show appt. Pt advised of his 08/15/24 appt. Pt was advised he would be Dced from PT services with another no show appt per the attendance policy.SABRA

## 2024-08-15 ENCOUNTER — Encounter: Payer: Self-pay | Admitting: Physical Therapy

## 2024-08-15 ENCOUNTER — Ambulatory Visit: Payer: MEDICAID | Admitting: Physical Therapy

## 2024-08-15 DIAGNOSIS — M25552 Pain in left hip: Secondary | ICD-10-CM

## 2024-08-15 DIAGNOSIS — R600 Localized edema: Secondary | ICD-10-CM

## 2024-08-15 DIAGNOSIS — M6281 Muscle weakness (generalized): Secondary | ICD-10-CM

## 2024-08-15 NOTE — Therapy (Signed)
 " PHYSICAL THERAPY DISCHARGE SUMMARY  Visits from Start of Care: 6  Current functional level related to goals / functional outcomes: See assessment/goals   Remaining deficits: See assessment/goals   Education / Equipment: HEP and D/C plans  Patient agrees to discharge. Patient goals were met. Patient is being discharged due to being pleased with the current functional level.   Patient Name: Justin Moore MRN: [id number] DOB:03-07-1966, 59 y.o., male Today's Date: 08/15/2024  END OF SESSION:  PT End of Session - 08/15/24 1146     Visit Number 6    Number of Visits 17    Date for Recertification  09/13/24    Authorization Type [employer]    PT Start Time 1145    PT Stop Time 1223    PT Time Calculation (min) 38 min          Past Medical History:  Diagnosis Date   Arthritis    Hypertension    Past Surgical History:  Procedure Laterality Date   TOTAL HIP ARTHROPLASTY Left 07/08/2024   Procedure: ARTHROPLASTY, HIP, TOTAL, ANTERIOR APPROACH;  Surgeon: [name], MD;  Location: [facility] OR;  Service: Orthopedics;  Laterality: Left;  3-C   Patient Active Problem List   Diagnosis Date Noted   Status post total replacement of left hip 07/08/2024   Primary osteoarthritis of left hip 06/11/2024   Pain of left hip 05/27/2024   Mixed hyperlipidemia 05/24/2024   Alcohol withdrawal delirium, acute, hyperactive (HCC) 12/26/2021   Thrombocytopenia 12/26/2021   Hypokalemia 12/26/2021   Hypertensive emergency 12/26/2021   Alcohol withdrawal (HCC) 12/26/2021   Alcoholic hepatitis without ascites (HCC) 05/20/2021   Alcohol use disorder 05/20/2021   Smoking 12/05/2019   Elevated lipids 12/05/2019   Leukocytosis 12/05/2019   Encounter to establish care 11/28/2019   Essential hypertension 11/28/2019    PCP: [name], PA-C  REFERRING PROVIDER: [name], MD   REFERRING DIAG: [phone] (ICD-10-CM) - Primary osteoarthritis of left hip   THERAPY DIAG:   Pain in left hip  Muscle weakness (generalized)  Localized edema  Rationale for Evaluation and Treatment: Rehabilitation  ONSET DATE: 07/08/24 DOS  SUBJECTIVE:   SUBJECTIVE STATEMENT: Pt reports he is doing well and seeing improvement in his hip pain. Does report continued incision tenderness as swelling.   PERTINENT HISTORY: High BMI, smokes cigarettes, arthritic L knee  PAIN:  Are you having pain? Yes: NPRS scale: 4/10 Pain location: L ant/lat hip Pain description: ache, throb, sharp Aggravating factors: moving around, when pain med wears off Relieving factors: Finding the right position  PRECAUTIONS: None  RED FLAGS: None   WEIGHT BEARING RESTRICTIONS: No WBAT  FALLS:  Has patient fallen in last 6 months? No  LIVING ENVIRONMENT: Lives with: lives with their family Lives in: House/apartment Stairs: Yes: Internal: 14 steps; on left going up and External: 0 steps; NA Has following equipment at home: Walker - 2 wheeled  OCCUPATION: Heavy arboriculturist  PLOF: Independent  PATIENT GOALS: To walk and move around as best as I can.  NEXT MD VISIT: 07/23/24, 1/26  OBJECTIVE:  Note: Objective measures were completed at Evaluation unless otherwise noted.  PATIENT SURVEYS:  LEFS:0/80=0%  COGNITION: Overall cognitive status: Within functional limits for tasks assessed     SENSATION: WFL  EDEMA:  L ant/lateral hip  MUSCLE LENGTH: Hamstrings: Right NT deg; Left NT deg Debby test: Right NT deg; Left NT deg  POSTURE: anterior pelvic tilt  PALPATION: TTP to the  peri ant/lat L hip  LOWER EXTREMITY ROM:  Active ROM Right eval Left eval  Hip flexion 40   Hip extension 15 lacking   Hip abduction    Hip adduction    Hip internal rotation    Hip external rotation    Knee flexion    Knee extension    Ankle dorsiflexion    Ankle plantarflexion    Ankle inversion    Ankle eversion     (Blank rows = not tested)  LOWER EXTREMITY MMT MMT  Right eval Left eval R 1/22  Hip flexion 2 pain  4  Hip extension 2 pain  4  Hip abduction 2 pain  4  Hip adduction     Hip internal rotation     Hip external rotation 2pain  4  Knee flexion     Knee extension     Ankle dorsiflexion     Ankle plantarflexion     Ankle inversion     Ankle eversion      (Blank rows = not tested)  FUNCTIONAL TESTS:  5 times sit to stand: Assess when pt is able to walk s an AD 2 minute walk test: Assess when pt is able to walk s an AD  GAIT: Distance walked: 200' Assistive device utilized: Environmental Consultant - 2 wheeled Level of assistance: Modified independence Comments: Walks c a heel to toe gait pattern                                                                                                             TREATMENT DATE:  [facility] Adult PT Treatment:                                                DATE: 08/15/24 Therapeutic Exercise: Hip flexor stretch - 1' x2  Therapeutic Activity: Standing hip abd Standing hip ext Standing march Standing heel raises NBOS on foam Checking goals and reviewing with pt     [facility] Adult PT Treatment  08/02/2023:  Therapeutic Exercise: nu-step L2 49m - stopped d/t knee pain LTR - 20x Supine hip adduction pilates ring - 2x8x3s S/L hip ER - Yellow TB - 2x8x3s Supine march - 2x8 SLR - R - 2x5 - bent d/t knee pain L bridge on ball - 3x8x3s SL hip abd - 2x4 L knee quad set with towel under knee - 2x8x3s Standing hip flexor stretch  Therapeutic Activity  STS - 2x4 - raised table  Manual Therapy  Gentle PROM with oscillations for pain control  Consider: hip flexor strech, standing hip strengthening     [facility] Adult PT Treatment:                                                DATE: 07/11/24 Therapeutic  Exercise: Developed, instructed in, and pt completed therex as noted in HEP  Self Care: Use of cold pack and elevation peridically during the day to swelling and pain management Eval findings and purpose of  PT  HOME EXERCISE PROGRAM: Access Code: [email or web address] URL: [email or web address] Date: 08/15/2024 Prepared by: Helene Gasmen  Exercises - Standing Hip Abduction with Resistance at Ankles and Counter Support  - 1 x daily - 7 x weekly - 3 sets - 10 reps - Standing Hip Extension with Resistance at Ankles and Unilateral Counter Support  - 1 x daily - 7 x weekly - 3 sets - 10 reps - Standing March with Counter Support  - 1 x daily - 7 x weekly - 3 sets - 10 reps - Heel Raises with Counter Support  - 1 x daily - 7 x weekly - 3 sets - 10 reps  ASSESSMENT:  CLINICAL IMPRESSION:  Upon goal re-check Lowery has met all goals set at evaluation.  He is able to ambulate for daily activities, has functional ROM, and is happy with is ability to complete daily tasks.  His L knee pain is significantly more limiting than his L hip at this point.  He plans to have TKA in the near future.  Will D/C with HEP; pt agrees with plan.  EVAL: Patient is a 59 y.o. male who was seen today for physical therapy evaluation and treatment for M16.12 (ICD-10-CM) - Primary osteoarthritis of left hip. Pt presents with signs and symptoms consistent with undergoing a recent L THA. Pt reports he begin to experience an increase in pain today, and due to pain, active movements of the L hip were limited. Pt did demonstrate a good heel to toe pattern with gait using a RW. Pt will benefit from skilled PT 2w8 to address impairments to optimize L hip/LE function with less pain.   OBJECTIVE IMPAIRMENTS: decreased activity tolerance, decreased balance, difficulty walking, decreased ROM, decreased strength, pain, and high BMI.   ACTIVITY LIMITATIONS: carrying, lifting, bending, sitting, standing, squatting, sleeping, stairs, transfers, bed mobility, bathing, toileting, dressing, hygiene/grooming, locomotion level, and caring for others  PARTICIPATION LIMITATIONS: meal prep, cleaning, laundry, driving, shopping, community  activity, and occupation  PERSONAL FACTORS: Past/current experiences, Time since onset of injury/illness/exacerbation, and 1-2 comorbidities: High BMI, smokes cigarettes are also affecting patient's functional outcome.   REHAB POTENTIAL: Good  CLINICAL DECISION MAKING: Evolving/moderate complexity  EVALUATION COMPLEXITY: Moderate   GOALS:  SHORT TERM GOALS: Target date: 07/26/24 Pt will be Ind in an initial HEP  Baseline:started Goal status: MET  2.  Increase L hip AROM to 5-75d to progress functional ROM of the L hip  Baseline: 15-40 12/31: 5-90 Goal status: MET  LONG TERM GOALS: Target date: 09/14/23  Pt will be Ind in a final HEP to maintain achieved LOF  Baseline: started 1/22: MET Goal status: MET  2.  Increase L hip AROM to 0-100d to progress functional use L hip/LE Baseline: 15-40 1/22: 0-105 Goal status: MET  3.  Increased L hip strength to 4/5 to progress functional use of the L hip/LE Baseline:  Goal status: MET  4.  Improve 5xSTS by MCID of 5 and by MCID of 18ft as indication of improved functional mobility  Baseline: Assess when pt is able to walk s an AD 08/09/24: 360 feet without AD, 12.8 sec without UE Goal status: ONGOING  5.  Pt will progress to Ind amb of 500' for community ambulation and for mod Ind to asc/dsc 12  step c alt pattern c HR Baseline: Mod ind c RW, mod ind c HR for steps step to pattern  1/22: MET  Goal status: MET  6.  Pt's LEFS score will improve to 50% or greater as indication of improved function  Baseline: 0% 1/22: 63/80 Goal status: MET   PLAN:  PT FREQUENCY: 2x/week  PT DURATION: 8 weeks  PLANNED INTERVENTIONS: 97164- PT Re-evaluation, 97110-Therapeutic exercises, 97530- Therapeutic activity, 97112- Neuromuscular re-education, 97535- Self Care, [id number]- Manual therapy, [phone]- Gait training, Patient/Family education, Balance training, Stair training, Taping, Joint mobilization, Cryotherapy, and Moist heat  PLAN FOR  NEXT SESSION: Assess response to HEP; progress therex as indicated; use of modalities, manual therapy as indicated.    Helene BRAVO Lamyia Cdebaca PT 08/15/24 12:27 PM   For all possible CPT codes, reference the Planned Interventions line above.     Check all conditions that are expected to impact treatment: {Conditions expected to impact treatment:Musculoskeletal disorders   If treatment provided at initial evaluation, no treatment charged due to lack of authorization.        "

## 2024-08-20 ENCOUNTER — Other Ambulatory Visit: Payer: MEDICAID

## 2024-08-20 ENCOUNTER — Ambulatory Visit: Payer: MEDICAID | Admitting: Orthopaedic Surgery

## 2024-08-20 DIAGNOSIS — M1712 Unilateral primary osteoarthritis, left knee: Secondary | ICD-10-CM | POA: Diagnosis not present

## 2024-08-20 DIAGNOSIS — Z96642 Presence of left artificial hip joint: Secondary | ICD-10-CM

## 2024-08-20 NOTE — Progress Notes (Signed)
 "  Office Visit Note   Patient: Justin Moore           Date of Birth: [birth year]           MRN: [id number] Visit Date: 08/20/2024              Requested by: [name], PA-C [address] [place],  [place] [address] PCP: Leavy Lucas, Fox, PA-C   Assessment & Plan: Visit Diagnoses:  1. Status post total replacement of left hip   2. Primary osteoarthritis of left knee     Plan: Patient is 6 weeks status post left total hip arthroplasty.  He has done very well and feels that he is fully recovered and ready to undergo a left total knee arthroplasty soon as possible.  Marval will call to confirm surgery date.  Impression is severe left knee degenerative joint disease secondary to Osteoarthritis.  Patient has attempted conservative treatment for at least 6 consecutive weeks within the past 12 weeks, including but not limited to physical therapy, home exercise program, NSAIDs, activity modification, and/or corticosteroid injections. Despite these efforts, symptoms have not improved or have worsened. Conservative measures have been deemed unsuccessful at this time. After a detailed discussion covering diagnosis and treatment options--including the risks, benefits, alternatives, and potential complications of surgical and nonsurgical management--the patient elected to proceed with surgery  Anticoagulants: No antithrombotic Postop anticoagulation: Eliquis  Diabetic: No  Nickel allergy: No Prior DVT/PE: No Tobacco use: No Clearances needed for surgery: None Anticipated discharge dispo: Home   Follow-Up Instructions: Return in about 6 weeks (around 10/01/2024).   Orders:  Orders Placed This Encounter  Procedures   XR Pelvis 1-2 Views   XR Knee 1-2 Views Left   No orders of the defined types were placed in this encounter.     Procedures: No procedures performed   Clinical Data: No additional findings.   Subjective: Chief Complaint  Patient presents with    Left Hip - Routine Post Op    HPI Justin Moore is 59 year old male 6 weeks status post left total hip arthroplasty.  He is doing well from this.  He is very happy.  He reports that his left knee is bothering him significantly.  Review of Systems  Constitutional: Negative.   HENT: Negative.    Eyes: Negative.   Respiratory: Negative.    Cardiovascular: Negative.   Gastrointestinal: Negative.   Endocrine: Negative.   Genitourinary: Negative.   Skin: Negative.   Allergic/Immunologic: Negative.   Neurological: Negative.   Hematological: Negative.   Psychiatric/Behavioral: Negative.    All other systems reviewed and are negative.    Objective: Vital Signs: There were no vitals taken for this visit.  Physical Exam Vitals and nursing note reviewed.  Constitutional:      Appearance: He is well-developed.  HENT:     Head: Normocephalic and atraumatic.  Eyes:     Pupils: Pupils are equal, round, and reactive to light.  Pulmonary:     Effort: Pulmonary effort is normal.  Abdominal:     Palpations: Abdomen is soft.  Musculoskeletal:        General: Normal range of motion.     Cervical back: Neck supple.  Skin:    General: Skin is warm.  Neurological:     Mental Status: He is alert and oriented to person, place, and time.  Psychiatric:        Behavior: Behavior normal.        Thought Content: Thought  content normal.        Judgment: Judgment normal.     Ortho Exam Examination of the left hip shows fully healed surgical scar. Examination of the left knee shows varus deformity and medial joint line tenderness with severe pain to range of motion. Specialty Comments:  No specialty comments available.  Imaging: XR Knee 1-2 Views Left Result Date: 08/20/2024 X-rays of the left knee show severe bone-on-bone joint space narrowing with varus deformity.  Kellgren-Lawrence stage IV.  XR Pelvis 1-2 Views Result Date: 08/20/2024 Stable total hip replacement without  complications    PMFS History: Patient Active Problem List   Diagnosis Date Noted   Primary osteoarthritis of left knee 08/20/2024   Status post total replacement of left hip 07/08/2024   Primary osteoarthritis of left hip 06/11/2024   Pain of left hip 05/27/2024   Mixed hyperlipidemia 05/24/2024   Alcohol withdrawal delirium, acute, hyperactive (HCC) 12/26/2021   Thrombocytopenia 12/26/2021   Hypokalemia 12/26/2021   Hypertensive emergency 12/26/2021   Alcohol withdrawal (HCC) 12/26/2021   Alcoholic hepatitis without ascites (HCC) 05/20/2021   Alcohol use disorder 05/20/2021   Smoking 12/05/2019   Elevated lipids 12/05/2019   Leukocytosis 12/05/2019   Encounter to establish care 11/28/2019   Essential hypertension 11/28/2019   Past Medical History:  Diagnosis Date   Arthritis    Hypertension     Family History  Problem Relation Age of Onset   Hypertension Mother    Heart attack Father    Hypertension Father     Past Surgical History:  Procedure Laterality Date   TOTAL HIP ARTHROPLASTY Left 07/08/2024   Procedure: ARTHROPLASTY, HIP, TOTAL, ANTERIOR APPROACH;  Surgeon: [name], MD;  Location: [facility] OR;  Service: Orthopedics;  Laterality: Left;  3-C   Social History   Occupational History   Occupation: Holiday Representative  Tobacco Use   Smoking status: Every Day    Current packs/day: 0.50    Types: Cigarettes   Smokeless tobacco: Current  Vaping Use   Vaping status: Never Used  Substance and Sexual Activity   Alcohol use: Not Currently    Alcohol/week: 17.0 standard drinks of alcohol    Types: 17 Shots of liquor per week    Comment: 1/5 of liquor daily   Drug use: Not Currently    Types: Cocaine    Comment: not currently using   Sexual activity: Yes    Birth control/protection: Condom        "

## 2024-09-04 ENCOUNTER — Ambulatory Visit: Payer: MEDICAID

## 2024-09-23 ENCOUNTER — Ambulatory Visit (HOSPITAL_COMMUNITY): Admit: 2024-09-23 | Payer: MEDICAID | Admitting: Orthopaedic Surgery

## 2024-10-08 ENCOUNTER — Encounter: Payer: MEDICAID | Admitting: Physician Assistant
